# Patient Record
Sex: Female | Born: 1940 | Race: White | Hispanic: No | Marital: Married | Smoking: Former smoker
Health system: Southern US, Community
[De-identification: ages and names within clinical notes are randomized; demographics above are authoritative.]

## PROBLEM LIST (undated history)

## (undated) DIAGNOSIS — Z8619 Personal history of other infectious and parasitic diseases: Secondary | ICD-10-CM

## (undated) DIAGNOSIS — G894 Chronic pain syndrome: Secondary | ICD-10-CM

## (undated) DIAGNOSIS — M069 Rheumatoid arthritis, unspecified: Secondary | ICD-10-CM

## (undated) DIAGNOSIS — I10 Essential (primary) hypertension: Secondary | ICD-10-CM

## (undated) DIAGNOSIS — J90 Pleural effusion, not elsewhere classified: Secondary | ICD-10-CM

## (undated) DIAGNOSIS — I482 Chronic atrial fibrillation, unspecified: Secondary | ICD-10-CM

## (undated) DIAGNOSIS — J189 Pneumonia, unspecified organism: Secondary | ICD-10-CM

## (undated) DIAGNOSIS — D649 Anemia, unspecified: Secondary | ICD-10-CM

## (undated) HISTORY — DX: Personal history of other infectious and parasitic diseases: Z86.19

## (undated) HISTORY — DX: Essential (primary) hypertension: I10

---
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
## 1958-06-13 HISTORY — PX: APPENDECTOMY: SHX54

## 1987-06-14 HISTORY — PX: KNEE SURGERY: SHX244

## 1988-06-13 HISTORY — PX: CARPAL TUNNEL RELEASE: SHX101

## 2009-06-13 HISTORY — PX: OTHER SURGICAL HISTORY: SHX169

## 2010-06-13 HISTORY — PX: OTHER SURGICAL HISTORY: SHX169

## 2011-07-12 DIAGNOSIS — Z0000 Encounter for general adult medical examination without abnormal findings: Secondary | ICD-10-CM | POA: Diagnosis not present

## 2011-07-12 DIAGNOSIS — E039 Hypothyroidism, unspecified: Secondary | ICD-10-CM | POA: Diagnosis not present

## 2011-10-25 DIAGNOSIS — E785 Hyperlipidemia, unspecified: Secondary | ICD-10-CM | POA: Diagnosis not present

## 2011-11-11 DIAGNOSIS — H35379 Puckering of macula, unspecified eye: Secondary | ICD-10-CM | POA: Diagnosis not present

## 2011-11-11 DIAGNOSIS — H18419 Arcus senilis, unspecified eye: Secondary | ICD-10-CM | POA: Diagnosis not present

## 2011-11-11 DIAGNOSIS — H1045 Other chronic allergic conjunctivitis: Secondary | ICD-10-CM | POA: Diagnosis not present

## 2011-11-11 DIAGNOSIS — H2589 Other age-related cataract: Secondary | ICD-10-CM | POA: Diagnosis not present

## 2012-03-08 DIAGNOSIS — R05 Cough: Secondary | ICD-10-CM | POA: Diagnosis not present

## 2012-11-14 DIAGNOSIS — H40009 Preglaucoma, unspecified, unspecified eye: Secondary | ICD-10-CM | POA: Diagnosis not present

## 2012-11-16 DIAGNOSIS — R030 Elevated blood-pressure reading, without diagnosis of hypertension: Secondary | ICD-10-CM | POA: Diagnosis not present

## 2012-11-16 DIAGNOSIS — E7800 Pure hypercholesterolemia, unspecified: Secondary | ICD-10-CM | POA: Diagnosis not present

## 2012-11-16 DIAGNOSIS — E039 Hypothyroidism, unspecified: Secondary | ICD-10-CM | POA: Diagnosis not present

## 2012-12-17 DIAGNOSIS — E7800 Pure hypercholesterolemia, unspecified: Secondary | ICD-10-CM | POA: Diagnosis not present

## 2012-12-17 DIAGNOSIS — E039 Hypothyroidism, unspecified: Secondary | ICD-10-CM | POA: Diagnosis not present

## 2013-03-05 DIAGNOSIS — N390 Urinary tract infection, site not specified: Secondary | ICD-10-CM | POA: Diagnosis not present

## 2013-08-27 DIAGNOSIS — N390 Urinary tract infection, site not specified: Secondary | ICD-10-CM | POA: Diagnosis not present

## 2013-10-01 DIAGNOSIS — N3090 Cystitis, unspecified without hematuria: Secondary | ICD-10-CM | POA: Diagnosis not present

## 2013-10-01 DIAGNOSIS — R030 Elevated blood-pressure reading, without diagnosis of hypertension: Secondary | ICD-10-CM | POA: Diagnosis not present

## 2013-10-01 DIAGNOSIS — M2540 Effusion, unspecified joint: Secondary | ICD-10-CM | POA: Diagnosis not present

## 2013-10-01 LAB — CBC AND DIFFERENTIAL
HCT: 31 % — AB (ref 36–46)
Hemoglobin: 10.1 g/dL — AB (ref 12.0–16.0)
Platelets: 279 10*3/uL (ref 150–399)
WBC: 12.4 10^3/mL

## 2013-10-01 LAB — HEPATIC FUNCTION PANEL: AST: 16 U/L (ref 13–35)

## 2013-10-28 DIAGNOSIS — D649 Anemia, unspecified: Secondary | ICD-10-CM | POA: Diagnosis not present

## 2013-10-28 DIAGNOSIS — M064 Inflammatory polyarthropathy: Secondary | ICD-10-CM | POA: Diagnosis not present

## 2013-10-28 DIAGNOSIS — M25569 Pain in unspecified knee: Secondary | ICD-10-CM | POA: Diagnosis not present

## 2013-10-28 DIAGNOSIS — I10 Essential (primary) hypertension: Secondary | ICD-10-CM | POA: Diagnosis not present

## 2013-10-28 DIAGNOSIS — M79609 Pain in unspecified limb: Secondary | ICD-10-CM | POA: Diagnosis not present

## 2013-10-28 DIAGNOSIS — R609 Edema, unspecified: Secondary | ICD-10-CM | POA: Diagnosis not present

## 2013-11-11 DIAGNOSIS — E018 Other iodine-deficiency related thyroid disorders and allied conditions: Secondary | ICD-10-CM | POA: Diagnosis not present

## 2013-11-11 DIAGNOSIS — R0989 Other specified symptoms and signs involving the circulatory and respiratory systems: Secondary | ICD-10-CM | POA: Diagnosis not present

## 2013-11-11 DIAGNOSIS — I10 Essential (primary) hypertension: Secondary | ICD-10-CM | POA: Diagnosis not present

## 2013-11-12 DIAGNOSIS — H40009 Preglaucoma, unspecified, unspecified eye: Secondary | ICD-10-CM | POA: Diagnosis not present

## 2013-11-15 DIAGNOSIS — M069 Rheumatoid arthritis, unspecified: Secondary | ICD-10-CM | POA: Diagnosis not present

## 2013-11-15 DIAGNOSIS — R894 Abnormal immunological findings in specimens from other organs, systems and tissues: Secondary | ICD-10-CM | POA: Diagnosis not present

## 2013-11-15 DIAGNOSIS — M1990 Unspecified osteoarthritis, unspecified site: Secondary | ICD-10-CM | POA: Diagnosis not present

## 2013-11-18 ENCOUNTER — Ambulatory Visit: Payer: Self-pay | Admitting: Family Medicine

## 2013-11-18 DIAGNOSIS — I7090 Unspecified atherosclerosis: Secondary | ICD-10-CM | POA: Diagnosis not present

## 2013-11-18 DIAGNOSIS — R0989 Other specified symptoms and signs involving the circulatory and respiratory systems: Secondary | ICD-10-CM | POA: Diagnosis not present

## 2013-11-18 DIAGNOSIS — I658 Occlusion and stenosis of other precerebral arteries: Secondary | ICD-10-CM | POA: Diagnosis not present

## 2013-11-18 DIAGNOSIS — I6529 Occlusion and stenosis of unspecified carotid artery: Secondary | ICD-10-CM | POA: Diagnosis not present

## 2013-11-18 HISTORY — PX: OTHER SURGICAL HISTORY: SHX169

## 2013-12-04 DIAGNOSIS — E018 Other iodine-deficiency related thyroid disorders and allied conditions: Secondary | ICD-10-CM | POA: Diagnosis not present

## 2013-12-04 DIAGNOSIS — I10 Essential (primary) hypertension: Secondary | ICD-10-CM | POA: Diagnosis not present

## 2013-12-04 LAB — BASIC METABOLIC PANEL
BUN: 27 mg/dL — AB (ref 4–21)
CREATININE: 1.3 mg/dL — AB (ref 0.5–1.1)
Glucose: 92 mg/dL
POTASSIUM: 4.6 mmol/L (ref 3.4–5.3)
Sodium: 137 mmol/L (ref 137–147)

## 2013-12-05 DIAGNOSIS — E7800 Pure hypercholesterolemia, unspecified: Secondary | ICD-10-CM | POA: Diagnosis not present

## 2013-12-05 DIAGNOSIS — E018 Other iodine-deficiency related thyroid disorders and allied conditions: Secondary | ICD-10-CM | POA: Diagnosis not present

## 2013-12-05 DIAGNOSIS — I10 Essential (primary) hypertension: Secondary | ICD-10-CM | POA: Diagnosis not present

## 2014-01-28 DIAGNOSIS — M159 Polyosteoarthritis, unspecified: Secondary | ICD-10-CM | POA: Diagnosis not present

## 2014-01-28 DIAGNOSIS — D6859 Other primary thrombophilia: Secondary | ICD-10-CM | POA: Diagnosis not present

## 2014-01-28 DIAGNOSIS — M069 Rheumatoid arthritis, unspecified: Secondary | ICD-10-CM | POA: Diagnosis not present

## 2014-01-28 DIAGNOSIS — M79609 Pain in unspecified limb: Secondary | ICD-10-CM | POA: Diagnosis not present

## 2014-02-06 DIAGNOSIS — I10 Essential (primary) hypertension: Secondary | ICD-10-CM | POA: Diagnosis not present

## 2014-02-06 DIAGNOSIS — E7800 Pure hypercholesterolemia, unspecified: Secondary | ICD-10-CM | POA: Diagnosis not present

## 2014-02-06 DIAGNOSIS — E039 Hypothyroidism, unspecified: Secondary | ICD-10-CM | POA: Diagnosis not present

## 2014-02-07 DIAGNOSIS — E7800 Pure hypercholesterolemia, unspecified: Secondary | ICD-10-CM | POA: Diagnosis not present

## 2014-02-07 DIAGNOSIS — E018 Other iodine-deficiency related thyroid disorders and allied conditions: Secondary | ICD-10-CM | POA: Diagnosis not present

## 2014-02-07 LAB — TSH: TSH: 5.25 u[IU]/mL (ref 0.41–5.90)

## 2014-02-07 LAB — LIPID PANEL
Cholesterol: 192 mg/dL (ref 0–200)
HDL: 64 mg/dL (ref 35–70)
LDL Cholesterol: 103 mg/dL
TRIGLYCERIDES: 126 mg/dL (ref 40–160)

## 2014-02-07 LAB — HEPATIC FUNCTION PANEL: ALT: 16 U/L (ref 7–35)

## 2014-05-19 DIAGNOSIS — M0589 Other rheumatoid arthritis with rheumatoid factor of multiple sites: Secondary | ICD-10-CM | POA: Diagnosis not present

## 2014-05-19 DIAGNOSIS — M150 Primary generalized (osteo)arthritis: Secondary | ICD-10-CM | POA: Diagnosis not present

## 2014-05-19 DIAGNOSIS — R760 Raised antibody titer: Secondary | ICD-10-CM | POA: Diagnosis not present

## 2014-12-08 DIAGNOSIS — R760 Raised antibody titer: Secondary | ICD-10-CM | POA: Diagnosis not present

## 2014-12-08 DIAGNOSIS — M0579 Rheumatoid arthritis with rheumatoid factor of multiple sites without organ or systems involvement: Secondary | ICD-10-CM | POA: Diagnosis not present

## 2014-12-08 DIAGNOSIS — M7062 Trochanteric bursitis, left hip: Secondary | ICD-10-CM | POA: Diagnosis not present

## 2014-12-08 DIAGNOSIS — M150 Primary generalized (osteo)arthritis: Secondary | ICD-10-CM | POA: Diagnosis not present

## 2014-12-24 DIAGNOSIS — Z79899 Other long term (current) drug therapy: Secondary | ICD-10-CM | POA: Diagnosis not present

## 2014-12-24 DIAGNOSIS — M069 Rheumatoid arthritis, unspecified: Secondary | ICD-10-CM | POA: Diagnosis not present

## 2015-07-01 DIAGNOSIS — M0579 Rheumatoid arthritis with rheumatoid factor of multiple sites without organ or systems involvement: Secondary | ICD-10-CM | POA: Diagnosis not present

## 2015-07-01 DIAGNOSIS — M150 Primary generalized (osteo)arthritis: Secondary | ICD-10-CM | POA: Diagnosis not present

## 2015-07-01 DIAGNOSIS — Z79899 Other long term (current) drug therapy: Secondary | ICD-10-CM | POA: Diagnosis not present

## 2015-07-01 DIAGNOSIS — R760 Raised antibody titer: Secondary | ICD-10-CM | POA: Diagnosis not present

## 2015-07-07 DIAGNOSIS — D649 Anemia, unspecified: Secondary | ICD-10-CM | POA: Diagnosis not present

## 2015-07-15 DIAGNOSIS — R0989 Other specified symptoms and signs involving the circulatory and respiratory systems: Secondary | ICD-10-CM | POA: Insufficient documentation

## 2015-07-15 DIAGNOSIS — I10 Essential (primary) hypertension: Secondary | ICD-10-CM | POA: Insufficient documentation

## 2015-07-15 DIAGNOSIS — Z86718 Personal history of other venous thrombosis and embolism: Secondary | ICD-10-CM | POA: Insufficient documentation

## 2015-07-15 DIAGNOSIS — M2540 Effusion, unspecified joint: Secondary | ICD-10-CM | POA: Insufficient documentation

## 2015-07-15 DIAGNOSIS — Z8639 Personal history of other endocrine, nutritional and metabolic disease: Secondary | ICD-10-CM | POA: Insufficient documentation

## 2015-07-15 DIAGNOSIS — E7800 Pure hypercholesterolemia, unspecified: Secondary | ICD-10-CM | POA: Insufficient documentation

## 2015-07-15 DIAGNOSIS — E018 Other iodine-deficiency related thyroid disorders and allied conditions: Secondary | ICD-10-CM | POA: Insufficient documentation

## 2015-07-15 DIAGNOSIS — M1990 Unspecified osteoarthritis, unspecified site: Secondary | ICD-10-CM | POA: Insufficient documentation

## 2015-07-15 DIAGNOSIS — D6861 Antiphospholipid syndrome: Secondary | ICD-10-CM | POA: Insufficient documentation

## 2015-07-15 DIAGNOSIS — I440 Atrioventricular block, first degree: Secondary | ICD-10-CM | POA: Insufficient documentation

## 2015-07-16 ENCOUNTER — Encounter: Payer: Self-pay | Admitting: Family Medicine

## 2015-07-16 ENCOUNTER — Ambulatory Visit (INDEPENDENT_AMBULATORY_CARE_PROVIDER_SITE_OTHER): Payer: Medicare Other | Admitting: Family Medicine

## 2015-07-16 VITALS — BP 138/70 | HR 76 | Temp 98.2°F | Resp 18 | Ht 62.0 in | Wt 234.0 lb

## 2015-07-16 DIAGNOSIS — D509 Iron deficiency anemia, unspecified: Secondary | ICD-10-CM | POA: Diagnosis not present

## 2015-07-16 DIAGNOSIS — E7800 Pure hypercholesterolemia, unspecified: Secondary | ICD-10-CM | POA: Diagnosis not present

## 2015-07-16 DIAGNOSIS — M069 Rheumatoid arthritis, unspecified: Secondary | ICD-10-CM | POA: Insufficient documentation

## 2015-07-16 DIAGNOSIS — I10 Essential (primary) hypertension: Secondary | ICD-10-CM

## 2015-07-16 DIAGNOSIS — M1990 Unspecified osteoarthritis, unspecified site: Secondary | ICD-10-CM | POA: Insufficient documentation

## 2015-07-16 DIAGNOSIS — E018 Other iodine-deficiency related thyroid disorders and allied conditions: Secondary | ICD-10-CM

## 2015-07-16 MED ORDER — TRAMADOL HCL 50 MG PO TABS: 50.0000 mg | ORAL_TABLET | Freq: Three times a day (TID) | ORAL | Status: DC | PRN

## 2015-07-16 NOTE — Progress Notes (Signed)
Patient: Tonya Mcgee Female    DOB: Nov 20, 1940   75 y.o.   MRN: [id number] Visit Date: 07/16/2015  Today's Provider: Mila Merry, MD   Chief Complaint  Patient presents with  . Hypertension    follow up  . Hypothyroidism    follow up  . Hyperlipidemia    follow up   Subjective:    HPI   Hypertension, follow-up:  BP Readings from Last 3 Encounters:  02/06/14 120/80    She was last seen for hypertension 18  months ago.  BP at that visit was 120/80. Management since that visit includes no changes. She reports good compliance with treatment. She is not having side effects.  She is not exercising. She is adherent to low salt diet.   Outside blood pressures are 98-130 systolic over 60-80 diastolic. She is experiencing none.  Patient denies chest pain, chest pressure/discomfort, claudication, dyspnea, exertional chest pressure/discomfort, fatigue, irregular heart beat, orthopnea, palpitations, paroxysmal nocturnal dyspnea, syncope and tachypnea.   Cardiovascular risk factors include advanced age (older than 71 for men, 38 for women), dyslipidemia, hypertension and sedentary lifestyle.  Use of agents associated with hypertension: NSAIDS.     Weight trend: increasing steadily Wt Readings from Last 3 Encounters:  02/06/14 225 lb (102.059 kg)    Current diet: in general, a "healthy" diet    ------------------------------------------------------------------------   Lipid/Cholesterol, Follow-up:   Last seen for this18  months ago.  Management changes since that visit include none. . Last Lipid Panel:    Component Value Date/Time   CHOL 192 02/07/2014   TRIG 126 02/07/2014   HDL 64 02/07/2014   LDLCALC 103 02/07/2014    Risk factors for vascular disease include hypercholesterolemia and hypertension  She reports good compliance with treatment. Patient wasn't able to tolerate the Pravastatin (caused stomach pain), so while in [place] patient states she  was changed to Lipitor 10mg  daily. Patient reports good compliance with Lipitor and good tolerance.  She is not having side effects.  Current symptoms include none and have been stable. Weight trend: increasing steadily Prior visit with dietician: no Current diet: in general, a "healthy" diet   Current exercise: none  Wt Readings from Last 3 Encounters:  02/06/14 225 lb (102.059 kg)    -------------------------------------------------------------------  Follow up Hypothyroidism: Last office visit was 02/06/2014 and no changes were made. Since last visit patient patient was seen by her family Practice physician back home in 02/08/2014 who changes Levothyroxine to [place] 3 times a week and 4 times a week. Patient reports good tolerance with treatment.    Anemia She is followed by Dr. for rheumatoid arthritis and recent labs from 1/24 found Hgb=8.6 with borderline low iron, elevated TIBS elevated transferrin iron saturation of 5%. She was started on once daily iron supplement. She states they discussed referral for colonoscopy, but was going to see how she responded to oral iron replacement first.     Allergies  Allergen Reactions  . Amoxicillin Hives  . Ciprofloxacin Hives  . Codeine Hives  . Iodine Hives    Throat swelling  . Keflex  [Cephalexin]   . Tape     skin red irritated   Previous Medications   ASPIRIN 81 MG TABLET    Take 1 tablet by mouth daily.   ATORVASTATIN (LIPITOR) 10 MG TABLET    Take 10 mg by mouth daily.   FERROUS SULFATE (IRON) 325 (65 FE) MG TABS    Take 1  tablet by mouth daily.   HYDROXYCHLOROQUINE (PLAQUENIL) 200 MG TABLET    Take 2 tablets by mouth daily.   LEVOTHYROXINE (LEVOXYL) 88 MCG TABLET    Take 1 tablet by mouth. Four times a week   LEVOTHYROXINE (SYNTHROID, LEVOTHROID) 100 MCG TABLET    Take 100 mcg by mouth. 3 times a week   LISINOPRIL-HYDROCHLOROTHIAZIDE (PRINZIDE,ZESTORETIC) 10-12.5 MG TABLET    Take 1 tablet by mouth daily.    MULTIPLE VITAMINS-MINERALS (MULTIVITAMIN ADULT PO)    Take 1 tablet by mouth daily.   OMEGA 3 1200 MG CAPS    Take 1 capsule by mouth daily.    Review of Systems  Constitutional: Negative for fever, chills, appetite change and fatigue.  Respiratory: Negative for chest tightness and shortness of breath.   Cardiovascular: Negative for chest pain and palpitations.  Gastrointestinal: Negative for nausea, vomiting and abdominal pain.  Musculoskeletal: Positive for arthralgias.  Neurological: Negative for dizziness and weakness.    Social History  Substance Use Topics  . Smoking status: Former Smoker -- 0.50 packs/day for 15 years    Types: Cigarettes    Quit date: 06/13/1978  . Smokeless tobacco: Not on file  . Alcohol Use: 0.0 oz/week    0 Standard drinks or equivalent per week     Comment: occasional use; 1-2 times a year   Objective:   BP 138/70 mmHg  Pulse 76  Temp(Src) 98.2 F (36.8 C) (Oral)  Resp 18  Ht 5\' 2"  (1.575 m)  Wt 234 lb (106.142 kg)  BMI 42.79 kg/m2  SpO2 100%  Physical Exam   General Appearance:    Alert, cooperative, no distress  Eyes:    PERRL, conjunctiva/corneas clear, EOM's intact       Lungs:     Clear to auscultation bilaterally, respirations unlabored  Heart:    Regular rate and rhythm, II/VI systolic murmur  Neurologic:   Awake, alert, oriented x 3. No apparent focal neurological           defect.          Assessment & Plan:     1. Osteoarthritis, unspecified osteoarthritis type, unspecified site Requests refill for tramadol which she feels is helpful - traMADol (ULTRAM) 50 MG tablet; Take 1 tablet (50 mg total) by mouth every 8 (eight) hours as needed.  Dispense: 30 tablet; Refill: 0  2. Hypothyroidism, iodine Due to check thyroid functions - T4 AND TSH  3. Essential hypertension Well controlled.  Continue current medications.   - Renal function panel  4. Hypercholesterolemia She is tolerating atorvastatin well with no adverse  effects.   - Lipid panel  5. Anemia Likely iron deficiency per labs done from Dr. office Will recheck labs after being on iron replacement for a month.       Gavin Potters, MD  [facility]

## 2015-07-17 ENCOUNTER — Other Ambulatory Visit: Payer: Self-pay | Admitting: Family Medicine

## 2015-07-17 NOTE — Telephone Encounter (Signed)
Pt stated that she was in for OV on 07/16/15 and were supposed to send the following RX sent to [facility]:  1. atorvastatin (LIPITOR) 10 MG tablet 2. levothyroxine (LEVOXYL) 88 MCG tablet  3. levothyroxine (SYNTHROID, LEVOTHROID) 100 MCG tablet 4. lisinopril-hydrochlorothiazide (PRINZIDE,ZESTORETIC) 10-12.5 MG tablet  Thanks TNP

## 2015-07-20 NOTE — Telephone Encounter (Signed)
We needed to see results of labs before refilling medications. Has she had blood drawn yet?

## 2015-07-21 DIAGNOSIS — E018 Other iodine-deficiency related thyroid disorders and allied conditions: Secondary | ICD-10-CM | POA: Diagnosis not present

## 2015-07-21 DIAGNOSIS — I10 Essential (primary) hypertension: Secondary | ICD-10-CM | POA: Diagnosis not present

## 2015-07-21 DIAGNOSIS — E7800 Pure hypercholesterolemia, unspecified: Secondary | ICD-10-CM | POA: Diagnosis not present

## 2015-07-22 ENCOUNTER — Other Ambulatory Visit: Payer: Self-pay | Admitting: Family Medicine

## 2015-07-22 ENCOUNTER — Telehealth: Payer: Self-pay

## 2015-07-22 LAB — LIPID PANEL
Chol/HDL Ratio: 2.4 ratio units (ref 0.0–4.4)
Cholesterol, Total: 176 mg/dL (ref 100–199)
HDL: 72 mg/dL (ref >39)
LDL CALC: 83 mg/dL (ref 0–99)
Triglycerides: 107 mg/dL (ref 0–149)
VLDL CHOLESTEROL CAL: 21 mg/dL (ref 5–40)

## 2015-07-22 LAB — RENAL FUNCTION PANEL
ALBUMIN: 3.9 g/dL (ref 3.5–4.8)
BUN / CREAT RATIO: 18 (ref 11–26)
BUN: 24 mg/dL (ref 8–27)
CALCIUM: 8.6 mg/dL — AB (ref 8.7–10.3)
CHLORIDE: 97 mmol/L (ref 96–106)
CO2: 22 mmol/L (ref 18–29)
CREATININE: 1.31 mg/dL — AB (ref 0.57–1.00)
GFR calc Af Amer: 46 mL/min/{1.73_m2} — ABNORMAL LOW (ref >59)
GFR calc non Af Amer: 40 mL/min/{1.73_m2} — ABNORMAL LOW (ref >59)
Glucose: 89 mg/dL (ref 65–99)
Phosphorus: 4.5 mg/dL (ref 2.5–4.5)
Potassium: 4.9 mmol/L (ref 3.5–5.2)
Sodium: 137 mmol/L (ref 134–144)

## 2015-07-22 LAB — T4 AND TSH
T4, Total: 8.6 ug/dL (ref 4.5–12.0)
TSH: 7.81 u[IU]/mL — AB (ref 0.450–4.500)

## 2015-07-22 MED ORDER — LEVOTHYROXINE SODIUM 100 MCG PO TABS: 100.0000 ug | ORAL_TABLET | Freq: Every day | ORAL | Status: DC

## 2015-07-22 MED ORDER — ATORVASTATIN CALCIUM 10 MG PO TABS: 10.0000 mg | ORAL_TABLET | Freq: Every day | ORAL | Status: DC

## 2015-07-22 MED ORDER — LISINOPRIL-HYDROCHLOROTHIAZIDE 10-12.5 MG PO TABS: 1.0000 | ORAL_TABLET | Freq: Every day | ORAL | Status: DC

## 2015-07-22 NOTE — Telephone Encounter (Signed)
Patient advised as directed below. Patient verbalized understanding. RX sent to [facility]. Patient will call back to schedule a follow up appointment.   Patient is requesting a refill on Lipitor and Lisinopril-HCTZ be sent to [facility]. Patient states this is her 3 refill request and she is almost out of medication.

## 2015-07-22 NOTE — Telephone Encounter (Signed)
-----   Message from Malva Limes, MD sent at 07/22/2015  9:57 AM EST ----- Labs show she is hyPOthyroid. Need to increase levothyroxine to daily, #90, rf x 1. Other labs fine. Need to follow up in 3 months.

## 2015-07-23 ENCOUNTER — Telehealth: Payer: Self-pay

## 2015-07-23 NOTE — Telephone Encounter (Signed)
Patient called and states that the 3 medications we filled yesterday [facility] were filled but pharmacy needed clarification on thyroid medication. I could not find that fax maybe dr. Sherrie Mustache took it home with him. Please review and let pt know when you find out thank you-aa

## 2015-07-24 MED ORDER — LEVOTHYROXINE SODIUM 100 MCG PO TABS: 100.0000 ug | ORAL_TABLET | Freq: Every day | ORAL | Status: DC

## 2015-07-24 NOTE — Telephone Encounter (Signed)
Rx for levothyroxine that was sent in still had the old directions (x3 a week) in the script. Clarification given. [facility] has already shipped levothyroxine with correct quantity. Patient was notified.

## 2015-07-28 ENCOUNTER — Encounter: Payer: Self-pay | Admitting: Family Medicine

## 2015-08-05 ENCOUNTER — Telehealth: Payer: Self-pay | Admitting: Family Medicine

## 2015-08-05 DIAGNOSIS — D509 Iron deficiency anemia, unspecified: Secondary | ICD-10-CM

## 2015-08-05 NOTE — Telephone Encounter (Signed)
LMTCB

## 2015-08-05 NOTE — Telephone Encounter (Signed)
Please advise patient it is time to check iron levels,  Order has been printed and is ready for her to pick up.

## 2015-08-06 NOTE — Telephone Encounter (Signed)
Gave pt message.  She will pick up lab slip.  Thanks Barth Kirks

## 2015-08-07 DIAGNOSIS — D509 Iron deficiency anemia, unspecified: Secondary | ICD-10-CM | POA: Diagnosis not present

## 2015-08-08 LAB — CBC
HEMATOCRIT: 31.3 % — AB (ref 34.0–46.6)
HEMOGLOBIN: 10.1 g/dL — AB (ref 11.1–15.9)
MCH: 28.1 pg (ref 26.6–33.0)
MCHC: 32.3 g/dL (ref 31.5–35.7)
MCV: 87 fL (ref 79–97)
Platelets: 297 10*3/uL (ref 150–379)
RBC: 3.59 x10E6/uL — ABNORMAL LOW (ref 3.77–5.28)
RDW: 21.2 % — AB (ref 12.3–15.4)
WBC: 6.2 10*3/uL (ref 3.4–10.8)

## 2015-08-08 LAB — FERRITIN: Ferritin: 34 ng/mL (ref 15–150)

## 2015-08-11 ENCOUNTER — Encounter: Payer: Self-pay | Admitting: Family Medicine

## 2015-08-11 ENCOUNTER — Ambulatory Visit (INDEPENDENT_AMBULATORY_CARE_PROVIDER_SITE_OTHER): Payer: Medicare Other | Admitting: Family Medicine

## 2015-08-11 VITALS — BP 140/82 | HR 79 | Temp 98.7°F | Resp 16 | Ht 62.0 in | Wt 234.0 lb

## 2015-08-11 DIAGNOSIS — M2550 Pain in unspecified joint: Secondary | ICD-10-CM

## 2015-08-11 DIAGNOSIS — D509 Iron deficiency anemia, unspecified: Secondary | ICD-10-CM | POA: Diagnosis not present

## 2015-08-11 MED ORDER — PREDNISONE 20 MG PO TABS: 20.0000 mg | ORAL_TABLET | Freq: Every day | ORAL | Status: AC

## 2015-08-11 NOTE — Progress Notes (Signed)
Patient: Tonya Mcgee Female    DOB: 01-07-1941   75 y.o.   MRN: [id number] Visit Date: 08/11/2015  Today's Provider: Mila Merry, MD   Chief Complaint  Patient presents with  . Joint Pain   Subjective:    HPI  Patient has recurrent joint pain. Patient says she has flare-ups of pain. Has been much worse in shoulders and fingers for the last 4-5 days. She states has had several similar flares in the past and always respond well to prednisone. She is not due to see Dr. Gavin Potters again until the summer.  She is also here to follow up on anemia. Hbg was 8.6 when she saw Dr. Gavin Potters in January. Has since been taking OTC iron supplement and was up to 10.1 last week. She reports no change in bowels or stools. She states she had a normal colonoscopy in 2011.   Lab Results  Component Value Date   FERRITIN 34 08/07/2015    Lab Results  Component Value Date   HGB 10.1* 10/01/2013       Allergies  Allergen Reactions  . Amoxicillin Hives  . Ciprofloxacin Hives  . Codeine Other (See Comments)    Weird dreams  . Iodine Hives    Throat swelling  . Keflex  [Cephalexin]   . Pravastatin Nausea Only  . Tape     skin red irritated   Previous Medications   ASPIRIN 81 MG TABLET    Take 1 tablet by mouth daily.   ATORVASTATIN (LIPITOR) 10 MG TABLET    Take 1 tablet (10 mg total) by mouth daily.   FERROUS SULFATE (IRON) 325 (65 FE) MG TABS    Take 1 tablet by mouth daily.   HYDROXYCHLOROQUINE (PLAQUENIL) 200 MG TABLET    Take 2 tablets by mouth daily.   LEVOTHYROXINE (SYNTHROID, LEVOTHROID) 100 MCG TABLET    Take 1 tablet (100 mcg total) by mouth daily before breakfast.   LISINOPRIL-HYDROCHLOROTHIAZIDE (PRINZIDE,ZESTORETIC) 10-12.5 MG TABLET    Take 1 tablet by mouth daily.   MULTIPLE VITAMINS-MINERALS (MULTIVITAMIN ADULT PO)    Take 1 tablet by mouth daily.   OMEGA 3 1200 MG CAPS    Take 1 capsule by mouth daily.   TRAMADOL (ULTRAM) 50 MG TABLET    Take 1 tablet (50 mg total)  by mouth every 8 (eight) hours as needed.    Review of Systems  Constitutional: Negative for fever, chills, appetite change and fatigue.  Respiratory: Negative for chest tightness and shortness of breath.   Cardiovascular: Negative for chest pain and palpitations.  Gastrointestinal: Negative for nausea, vomiting and abdominal pain.  Musculoskeletal: Positive for back pain, joint swelling and arthralgias.  Neurological: Negative for dizziness and weakness.    Social History  Substance Use Topics  . Smoking status: Former Smoker -- 0.50 packs/day for 15 years    Types: Cigarettes    Quit date: 06/13/1978  . Smokeless tobacco: Not on file  . Alcohol Use: 0.0 oz/week    0 Standard drinks or equivalent per week     Comment: occasional use; 1-2 times a year   Objective:   BP 140/82 mmHg  Pulse 79  Temp(Src) 98.7 F (37.1 C) (Oral)  Resp 16  Ht 5\' 2"  (1.575 m)  Wt 234 lb (106.142 kg)  BMI 42.79 kg/m2  SpO2 95%  Physical Exam   General Appearance:    Alert, cooperative, no distress  Eyes:    PERRL, conjunctiva/corneas clear, EOM's intact  Lungs:     Clear to auscultation bilaterally, respirations unlabored  Heart:    Regular rate and rhythm  Neurologic:   Awake, alert, oriented x 3. No apparent focal neurological           defect.           Assessment & Plan:     1. Iron deficiency anemia She is reluctant to have another colonoscopy and has had no change in BMs or stools. Has had significant increase in hemoglobin with iron supplementation. She is agreeable to checking for occult blood in stool.  - IFOBT POC (occult bld, rslt in office)  2. Arthralgia She states short course of prednisone has always worked well for her in the past. She is to call if not quickly improving.  - predniSONE (DELTASONE) 20 MG tablet; Take 1 tablet (20 mg total) by mouth daily with breakfast.  Dispense: 14 tablet; Refill: 0       Mila Merry, MD  [facility]

## 2015-10-06 ENCOUNTER — Ambulatory Visit (INDEPENDENT_AMBULATORY_CARE_PROVIDER_SITE_OTHER): Payer: Medicare Other | Admitting: Family Medicine

## 2015-10-06 ENCOUNTER — Encounter: Payer: Self-pay | Admitting: Family Medicine

## 2015-10-06 VITALS — BP 150/70 | HR 76 | Temp 98.1°F | Resp 16 | Ht 62.0 in | Wt 210.0 lb

## 2015-10-06 DIAGNOSIS — M5442 Lumbago with sciatica, left side: Secondary | ICD-10-CM | POA: Diagnosis not present

## 2015-10-06 LAB — IFOBT (OCCULT BLOOD): IFOBT: POSITIVE

## 2015-10-06 MED ORDER — NAPROXEN 500 MG PO TABS: 500.0000 mg | ORAL_TABLET | Freq: Two times a day (BID) | ORAL | Status: DC | PRN

## 2015-10-06 MED ORDER — CYCLOBENZAPRINE HCL 10 MG PO TABS: 10.0000 mg | ORAL_TABLET | Freq: Every evening | ORAL | Status: DC | PRN

## 2015-10-06 NOTE — Progress Notes (Signed)
Patient: Tonya Mcgee Female    DOB: August 03, 1940   75 y.o.   MRN: [id number] Visit Date: 10/06/2015  Today's Provider: Mila Merry, MD   Chief Complaint  Patient presents with  . Back Pain   Subjective:    Back Pain This is a new problem. The current episode started more than 1 month ago (1 month). The problem occurs intermittently. The problem is unchanged. The pain is present in the lumbar spine. The quality of the pain is described as cramping and aching. The pain radiates to the left thigh. The pain is at a severity of 8/10. The pain is moderate. The pain is worse during the day. The symptoms are aggravated by lying down, position, bending and coughing. Associated symptoms include leg pain, numbness and tingling. Pertinent negatives include no abdominal pain, bladder incontinence, bowel incontinence, chest pain, dysuria, fever, headaches, paresis, paresthesias, pelvic pain, perianal numbness, weakness or weight loss. (Both legs) She has tried NSAIDs (tylenol and ibuprofen) for the symptoms. The treatment provided mild relief.    Back pain started 1 month ago when she was on a cruise. She slept on a soft mattress and has had low back pain since. Pain resolved within a week of returning home, but came back about 2 weeks ago when she had cough and cold symptoms.  Pain is worse in the morning and eases off into the evening. Does have radiation in left hip. Has taken Advil and Tylenol which don't help much. Pain is worse when she gets up in the morning.     Allergies  Allergen Reactions  . Amoxicillin Hives  . Ciprofloxacin Hives  . Codeine Other (See Comments)    Weird dreams  . Iodine Hives    Throat swelling  . Keflex  [Cephalexin]   . Pravastatin Nausea Only  . Tape     skin red irritated   Previous Medications   ASPIRIN 81 MG TABLET    Take 1 tablet by mouth daily.   ATORVASTATIN (LIPITOR) 10 MG TABLET    Take 1 tablet (10 mg total) by mouth daily.   FERROUS SULFATE  (IRON) 325 (65 FE) MG TABS    Take 1 tablet by mouth daily.   HYDROXYCHLOROQUINE (PLAQUENIL) 200 MG TABLET    Take 2 tablets by mouth daily.   LEVOTHYROXINE (SYNTHROID, LEVOTHROID) 100 MCG TABLET    Take 1 tablet (100 mcg total) by mouth daily before breakfast.   LISINOPRIL-HYDROCHLOROTHIAZIDE (PRINZIDE,ZESTORETIC) 10-12.5 MG TABLET    Take 1 tablet by mouth daily.   MULTIPLE VITAMINS-MINERALS (MULTIVITAMIN ADULT PO)    Take 1 tablet by mouth daily.   OMEGA 3 1200 MG CAPS    Take 1 capsule by mouth daily.   TRAMADOL (ULTRAM) 50 MG TABLET    Take 1 tablet (50 mg total) by mouth every 8 (eight) hours as needed.    Review of Systems  Constitutional: Negative for fever, chills, weight loss, appetite change and fatigue.  Respiratory: Negative for chest tightness and shortness of breath.   Cardiovascular: Negative for chest pain and palpitations.  Gastrointestinal: Negative for nausea, vomiting, abdominal pain and bowel incontinence.  Genitourinary: Negative for bladder incontinence, dysuria and pelvic pain.  Musculoskeletal: Positive for myalgias and back pain.  Neurological: Positive for tingling and numbness. Negative for dizziness, weakness, headaches and paresthesias.    Social History  Substance Use Topics  . Smoking status: Former Smoker -- 0.50 packs/day for 15 years    Types: Cigarettes  Quit date: 06/13/1978  . Smokeless tobacco: Not on file  . Alcohol Use: 0.0 oz/week    0 Standard drinks or equivalent per week     Comment: occasional use; 1-2 times a year   Objective:   BP 150/70 mmHg  Pulse 76  Temp(Src) 98.1 F (36.7 C) (Oral)  Resp 16  Ht 5\' 2"  (1.575 m)  Wt 210 lb (95.255 kg)  BMI 38.40 kg/m2  SpO2 97%  Physical Exam  General appearance: alert, well developed, well nourished, cooperative and in no distress Head: Normocephalic, without obvious abnormality, atraumatic Lungs: Respirations even and unlabored MS: Mild tenderness across para-lumbar muscles  bilaterally. . No rashes seen  Psych: Appropriate mood and affect. Neurologic: Mental status: Alert, oriented to person, place, and time, thought content appropriate.     Assessment & Plan:     1. Low back pain with left-sided sciatica, unspecified back pain laterality  - naproxen (NAPROSYN) 500 MG tablet; Take 1 tablet (500 mg total) by mouth 2 (two) times daily as needed.  Dispense: 30 tablet; Refill: 0 - cyclobenzaprine (FLEXERIL) 10 MG tablet; Take 1 tablet (10 mg total) by mouth at bedtime as needed (back pain).  Dispense: 20 tablet; Refill: 0   Call if symptoms change or if not rapidly improving.          , MD  [facility]

## 2015-10-12 ENCOUNTER — Ambulatory Visit
Admission: RE | Admit: 2015-10-12 | Discharge: 2015-10-12 | Disposition: A | Discharge status: Home / Self Care | Payer: Medicare Other | Source: Ambulatory Visit | Attending: Family Medicine | Admitting: Family Medicine

## 2015-10-12 ENCOUNTER — Telehealth: Payer: Self-pay | Admitting: Family Medicine

## 2015-10-12 DIAGNOSIS — M5441 Lumbago with sciatica, right side: Secondary | ICD-10-CM

## 2015-10-12 DIAGNOSIS — I7090 Unspecified atherosclerosis: Secondary | ICD-10-CM | POA: Diagnosis not present

## 2015-10-12 DIAGNOSIS — M47816 Spondylosis without myelopathy or radiculopathy, lumbar region: Secondary | ICD-10-CM | POA: Diagnosis not present

## 2015-10-12 DIAGNOSIS — M4186 Other forms of scoliosis, lumbar region: Secondary | ICD-10-CM | POA: Insufficient documentation

## 2015-10-12 DIAGNOSIS — M5442 Lumbago with sciatica, left side: Principal | ICD-10-CM

## 2015-10-12 NOTE — Telephone Encounter (Signed)
Patient was notified. Patient stated that she will get x-ray done.

## 2015-10-12 NOTE — Telephone Encounter (Signed)
Have ordered LS spine. Please have her go to [facility] today for Xr.

## 2015-10-12 NOTE — Telephone Encounter (Signed)
Please advise 

## 2015-10-12 NOTE — Telephone Encounter (Signed)
Pt stated she has been taking cyclobenzaprine (FLEXERIL) 10 MG tablet & naproxen (NAPROSYN) 500 MG tablet as directed since 10/06/15 and her pain has gotten worse not better. Pt wanted to see what else she could try or if she should get an X ray. Pharmacy: [facility]. [address]. Please advise. Thanks TNP

## 2015-10-14 ENCOUNTER — Encounter: Payer: Self-pay | Admitting: Family Medicine

## 2015-10-14 ENCOUNTER — Telehealth: Payer: Self-pay | Admitting: Family Medicine

## 2015-10-14 DIAGNOSIS — M5442 Lumbago with sciatica, left side: Secondary | ICD-10-CM

## 2015-10-14 DIAGNOSIS — I700 Atherosclerosis of aorta: Secondary | ICD-10-CM | POA: Insufficient documentation

## 2015-10-14 NOTE — Telephone Encounter (Signed)
-----   Message from Malva Limes, MD sent at 10/14/2015 11:13 AM EDT ----- Herby Abraham shows a lot of arthritis in spine, otherwise normal. Recommend referral to physical therapy if back pain is not improving.

## 2015-10-14 NOTE — Telephone Encounter (Signed)
Pt is requesting the results from a x-ray.  CB#[phone]/[id number]

## 2015-10-14 NOTE — Telephone Encounter (Signed)
Patient was notified of results. Patient expressed understanding. Order for referral is in epic.

## 2015-10-14 NOTE — Telephone Encounter (Signed)
Please schedule referral for PT? Thanks!

## 2015-10-15 ENCOUNTER — Encounter: Payer: Self-pay | Admitting: Family Medicine

## 2015-10-20 DIAGNOSIS — R262 Difficulty in walking, not elsewhere classified: Secondary | ICD-10-CM | POA: Diagnosis not present

## 2015-10-20 DIAGNOSIS — M6281 Muscle weakness (generalized): Secondary | ICD-10-CM | POA: Diagnosis not present

## 2015-10-20 DIAGNOSIS — M545 Low back pain: Secondary | ICD-10-CM | POA: Diagnosis not present

## 2015-10-22 DIAGNOSIS — M6281 Muscle weakness (generalized): Secondary | ICD-10-CM | POA: Diagnosis not present

## 2015-10-22 DIAGNOSIS — R262 Difficulty in walking, not elsewhere classified: Secondary | ICD-10-CM | POA: Diagnosis not present

## 2015-10-22 DIAGNOSIS — M545 Low back pain: Secondary | ICD-10-CM | POA: Diagnosis not present

## 2015-10-26 ENCOUNTER — Telehealth: Payer: Self-pay

## 2015-10-26 DIAGNOSIS — M545 Low back pain: Secondary | ICD-10-CM

## 2015-10-26 DIAGNOSIS — M6281 Muscle weakness (generalized): Secondary | ICD-10-CM | POA: Diagnosis not present

## 2015-10-26 DIAGNOSIS — R262 Difficulty in walking, not elsewhere classified: Secondary | ICD-10-CM | POA: Diagnosis not present

## 2015-10-26 NOTE — Telephone Encounter (Signed)
Daniel from [facility] called stating patient was seen today for a Physcical therapy session. The therapist want to know if Dr. Sherrie Mustache could add a referral for left knee because patient is having a lot of pain in her left knee, which they feel may be the cause of her back pain. Please fax order to ( [phone]. Questions call Reuel Boom at [phone]

## 2015-10-26 NOTE — Telephone Encounter (Signed)
Please clarify. Is he requesting orthopedic referral for knee pain? If so then that is fine and forward to Sarah for referral to orthopedist.

## 2015-10-26 NOTE — Telephone Encounter (Signed)
Pt called back saying the order PT needs to be for back pain and left leg strengthening  and exercises because of left knee weakness.  She stated she did not need an order for an orhtopedic referral.  Pt call back [phone]  Thanks teri

## 2015-10-27 NOTE — Telephone Encounter (Signed)
OK for PT for back pain and left leg strengthening.

## 2015-10-27 NOTE — Telephone Encounter (Signed)
New order in epic. 

## 2015-10-28 NOTE — Telephone Encounter (Signed)
Order faxed to [facility]

## 2015-10-29 ENCOUNTER — Ambulatory Visit: Payer: Medicare Other | Admitting: Physical Therapy

## 2015-10-29 DIAGNOSIS — M545 Low back pain: Secondary | ICD-10-CM | POA: Diagnosis not present

## 2015-10-29 DIAGNOSIS — M6281 Muscle weakness (generalized): Secondary | ICD-10-CM | POA: Diagnosis not present

## 2015-10-29 DIAGNOSIS — R262 Difficulty in walking, not elsewhere classified: Secondary | ICD-10-CM | POA: Diagnosis not present

## 2015-10-30 ENCOUNTER — Other Ambulatory Visit: Payer: Self-pay | Admitting: Family Medicine

## 2015-10-30 DIAGNOSIS — M5442 Lumbago with sciatica, left side: Secondary | ICD-10-CM

## 2015-10-30 MED ORDER — CYCLOBENZAPRINE HCL 10 MG PO TABS: 10.0000 mg | ORAL_TABLET | Freq: Every evening | ORAL | Status: DC | PRN

## 2015-10-30 NOTE — Addendum Note (Signed)
Addended by: Malva Limes on: 10/30/2015 12:39 PM   Modules accepted: Orders

## 2015-11-02 DIAGNOSIS — M6281 Muscle weakness (generalized): Secondary | ICD-10-CM | POA: Diagnosis not present

## 2015-11-02 DIAGNOSIS — M545 Low back pain: Secondary | ICD-10-CM | POA: Diagnosis not present

## 2015-11-02 DIAGNOSIS — R262 Difficulty in walking, not elsewhere classified: Secondary | ICD-10-CM | POA: Diagnosis not present

## 2015-11-03 ENCOUNTER — Encounter: Payer: Medicare Other | Admitting: Physical Therapy

## 2015-11-05 ENCOUNTER — Encounter: Payer: Medicare Other | Admitting: Physical Therapy

## 2015-11-10 DIAGNOSIS — M545 Low back pain: Secondary | ICD-10-CM | POA: Diagnosis not present

## 2015-11-10 DIAGNOSIS — R262 Difficulty in walking, not elsewhere classified: Secondary | ICD-10-CM | POA: Diagnosis not present

## 2015-11-10 DIAGNOSIS — M6281 Muscle weakness (generalized): Secondary | ICD-10-CM | POA: Diagnosis not present

## 2015-11-12 ENCOUNTER — Encounter: Payer: Medicare Other | Admitting: Physical Therapy

## 2015-11-16 ENCOUNTER — Encounter: Payer: Medicare Other | Admitting: Physical Therapy

## 2015-11-18 DIAGNOSIS — M545 Low back pain: Secondary | ICD-10-CM | POA: Diagnosis not present

## 2015-11-18 DIAGNOSIS — M6281 Muscle weakness (generalized): Secondary | ICD-10-CM | POA: Diagnosis not present

## 2015-11-18 DIAGNOSIS — R262 Difficulty in walking, not elsewhere classified: Secondary | ICD-10-CM | POA: Diagnosis not present

## 2015-11-19 ENCOUNTER — Encounter: Payer: Medicare Other | Admitting: Physical Therapy

## 2015-11-22 ENCOUNTER — Other Ambulatory Visit: Payer: Self-pay | Admitting: Family Medicine

## 2015-11-23 ENCOUNTER — Encounter: Payer: Medicare Other | Admitting: Physical Therapy

## 2015-11-26 ENCOUNTER — Encounter: Payer: Medicare Other | Admitting: Physical Therapy

## 2015-11-26 DIAGNOSIS — R262 Difficulty in walking, not elsewhere classified: Secondary | ICD-10-CM | POA: Diagnosis not present

## 2015-11-26 DIAGNOSIS — M6281 Muscle weakness (generalized): Secondary | ICD-10-CM | POA: Diagnosis not present

## 2015-11-26 DIAGNOSIS — M545 Low back pain: Secondary | ICD-10-CM | POA: Diagnosis not present

## 2015-11-30 ENCOUNTER — Encounter: Payer: Medicare Other | Admitting: Physical Therapy

## 2015-12-01 ENCOUNTER — Other Ambulatory Visit: Payer: Self-pay | Admitting: Family Medicine

## 2015-12-01 DIAGNOSIS — M1990 Unspecified osteoarthritis, unspecified site: Secondary | ICD-10-CM

## 2015-12-02 MED ORDER — TRAMADOL HCL 50 MG PO TABS: 50.0000 mg | ORAL_TABLET | Freq: Three times a day (TID) | ORAL | Status: DC | PRN

## 2015-12-02 NOTE — Telephone Encounter (Signed)
Please call in tramadol.  

## 2015-12-02 NOTE — Telephone Encounter (Signed)
Rx called in to pharmacy. 

## 2015-12-03 ENCOUNTER — Encounter: Payer: Medicare Other | Admitting: Physical Therapy

## 2015-12-17 ENCOUNTER — Ambulatory Visit: Payer: Self-pay | Admitting: Family Medicine

## 2015-12-22 ENCOUNTER — Ambulatory Visit (INDEPENDENT_AMBULATORY_CARE_PROVIDER_SITE_OTHER): Payer: Medicare Other | Admitting: Family Medicine

## 2015-12-22 ENCOUNTER — Encounter: Payer: Self-pay | Admitting: Family Medicine

## 2015-12-22 VITALS — BP 130/80 | HR 74 | Temp 98.0°F | Resp 16 | Wt 234.0 lb

## 2015-12-22 DIAGNOSIS — M5432 Sciatica, left side: Secondary | ICD-10-CM

## 2015-12-22 MED ORDER — PREDNISONE 10 MG PO TABS: ORAL_TABLET | ORAL | Status: AC

## 2015-12-22 NOTE — Progress Notes (Signed)
Patient: Tonya Mcgee Female    DOB: 08/24/1940   75 y.o.   MRN: [id number] Visit Date: 12/22/2015  Today's Provider: Mila Merry, MD   Chief Complaint  Patient presents with  . Back Pain   Subjective:    HPI Low Back pain with Left sided sciatica: Patient was last seen for this problem 3 months ago. Management during that visit includes starting Naproxen and Cyclobenzaprine. X rays was also ordered showing arthritis in the spine. Patient was referred to Physical Therapy at [facility] which she has completed. Today patient comes in stating he has been to Physical therapy and her back pain has improved. Patient has been taking the Flexeril, but reports the Naproxen did not help. Patient says her back pain has improved since the last ofice visit. She now has pain on her lower left side of her back for the last 2 weeks. Pain radiates down her left leg, but back pain is much better. She continues to do back exercise on a regular basis. She continues routine follow up with Dr. Gavin Potters for rheumatoid arthritis and has follow up 7-31    Allergies  Allergen Reactions  . Amoxicillin Hives  . Ciprofloxacin Hives  . Codeine Other (See Comments)    Weird dreams  . Iodine Hives    Throat swelling  . Keflex  [Cephalexin]   . Pravastatin Nausea Only  . Tape     skin red irritated   Current Meds  Medication Sig  . aspirin 81 MG tablet Take 1 tablet by mouth daily.  Marland Kitchen atorvastatin (LIPITOR) 10 MG tablet Take 1 tablet (10 mg total) by mouth daily.  . cyclobenzaprine (FLEXERIL) 10 MG tablet Take 1 tablet (10 mg total) by mouth at bedtime as needed (back pain).  . Ferrous Sulfate (IRON) 325 (65 Fe) MG TABS Take 1 tablet by mouth daily.  . hydroxychloroquine (PLAQUENIL) 200 MG tablet Take 2 tablets by mouth daily.  Marland Kitchen levothyroxine (SYNTHROID, LEVOTHROID) 100 MCG tablet TAKE ONE TABLET DAILY  . lisinopril-hydrochlorothiazide (PRINZIDE,ZESTORETIC) 10-12.5 MG tablet Take 1 tablet by mouth  daily.  . Multiple Vitamins-Minerals (MULTIVITAMIN ADULT PO) Take 1 tablet by mouth daily.  . Omega 3 1200 MG CAPS Take 1 capsule by mouth daily.  . traMADol (ULTRAM) 50 MG tablet Take 1 tablet (50 mg total) by mouth every 8 (eight) hours as needed.    Review of Systems  Constitutional: Negative for fever, chills, appetite change and fatigue.  Respiratory: Negative for chest tightness and shortness of breath.   Cardiovascular: Negative for chest pain and palpitations.  Gastrointestinal: Negative for nausea, vomiting and abdominal pain.  Musculoskeletal: Positive for myalgias (left leg), back pain and arthralgias.  Neurological: Negative for dizziness and weakness.    Social History  Substance Use Topics  . Smoking status: Former Smoker -- 0.50 packs/day for 15 years    Types: Cigarettes    Quit date: 06/13/1978  . Smokeless tobacco: Not on file  . Alcohol Use: 0.0 oz/week    0 Standard drinks or equivalent per week     Comment: occasional use; 1-2 times a year   Objective:   BP 130/80 mmHg  Pulse 74  Temp(Src) 98 F (36.7 C) (Oral)  Resp 16  Wt 234 lb (106.142 kg)  SpO2 98%  Physical Exam  General appearance: alert, well developed, well nourished, cooperative and in no distress Head: Normocephalic, without obvious abnormality, atraumatic Respiratory: Respirations even and unlabored, normal respiratory rate Extremities: Mild tenderness  left lateral paraspinous muscles. +4 LE strength, symmetric. Normal s/s/      Assessment & Plan:     1. Sciatica of left side She reports she has done well with prednisone in past. Start taper. Follow up Dr. Gavin Potters end of months as scheduled.  - predniSONE (DELTASONE) 10 MG tablet; 6 tablets for 2 days, then 5 for 2 days, then 4 for 2 days, then 3 for 2 days, then 2 for 2 days, then 1 daily until gone  Dispense: 50 tablet; Refill: 0   The entirety of the information documented in the History of Present Illness, Review of Systems and  Physical Exam were personally obtained by me. Portions of this information were initially documented by Awilda Bill, CMA and reviewed by me for thoroughness and accuracy.         Mila Merry, MD  [facility]

## 2016-01-11 DIAGNOSIS — R760 Raised antibody titer: Secondary | ICD-10-CM | POA: Diagnosis not present

## 2016-01-11 DIAGNOSIS — D649 Anemia, unspecified: Secondary | ICD-10-CM | POA: Diagnosis not present

## 2016-01-11 DIAGNOSIS — M150 Primary generalized (osteo)arthritis: Secondary | ICD-10-CM | POA: Diagnosis not present

## 2016-01-25 DIAGNOSIS — H35372 Puckering of macula, left eye: Secondary | ICD-10-CM | POA: Diagnosis not present

## 2016-01-25 DIAGNOSIS — H2513 Age-related nuclear cataract, bilateral: Secondary | ICD-10-CM | POA: Diagnosis not present

## 2016-03-28 DIAGNOSIS — Z23 Encounter for immunization: Secondary | ICD-10-CM | POA: Diagnosis not present

## 2016-04-27 ENCOUNTER — Telehealth: Payer: Self-pay | Admitting: Family Medicine

## 2016-04-27 NOTE — Telephone Encounter (Signed)
Called Pt to schedule AWV with [facility] - knb °

## 2016-04-30 DIAGNOSIS — N390 Urinary tract infection, site not specified: Secondary | ICD-10-CM | POA: Diagnosis not present

## 2016-06-28 ENCOUNTER — Other Ambulatory Visit: Payer: Self-pay | Admitting: Family Medicine

## 2016-07-11 ENCOUNTER — Telehealth: Payer: Self-pay | Admitting: Family Medicine

## 2016-07-11 ENCOUNTER — Ambulatory Visit
Admission: RE | Admit: 2016-07-11 | Discharge: 2016-07-11 | Disposition: A | Discharge status: Home / Self Care | Payer: Medicare Other | Source: Ambulatory Visit | Attending: Family Medicine | Admitting: Family Medicine

## 2016-07-11 ENCOUNTER — Encounter: Payer: Self-pay | Admitting: Family Medicine

## 2016-07-11 ENCOUNTER — Ambulatory Visit (INDEPENDENT_AMBULATORY_CARE_PROVIDER_SITE_OTHER): Payer: Medicare Other | Admitting: Family Medicine

## 2016-07-11 VITALS — BP 134/72 | HR 82 | Temp 98.0°F | Resp 24

## 2016-07-11 DIAGNOSIS — J209 Acute bronchitis, unspecified: Secondary | ICD-10-CM | POA: Diagnosis not present

## 2016-07-11 DIAGNOSIS — R05 Cough: Secondary | ICD-10-CM

## 2016-07-11 DIAGNOSIS — R0602 Shortness of breath: Secondary | ICD-10-CM | POA: Diagnosis not present

## 2016-07-11 DIAGNOSIS — R918 Other nonspecific abnormal finding of lung field: Secondary | ICD-10-CM | POA: Insufficient documentation

## 2016-07-11 DIAGNOSIS — R059 Cough, unspecified: Secondary | ICD-10-CM

## 2016-07-11 DIAGNOSIS — J440 Chronic obstructive pulmonary disease with acute lower respiratory infection: Secondary | ICD-10-CM | POA: Diagnosis not present

## 2016-07-11 MED ORDER — LEVOFLOXACIN 500 MG PO TABS: 500.0000 mg | ORAL_TABLET | Freq: Every day | ORAL | 0 refills | Status: DC

## 2016-07-11 MED ORDER — ALBUTEROL SULFATE HFA 108 (90 BASE) MCG/ACT IN AERS: 2.0000 | INHALATION_SPRAY | Freq: Four times a day (QID) | RESPIRATORY_TRACT | 2 refills | Status: DC | PRN

## 2016-07-11 NOTE — Telephone Encounter (Signed)
Please review. Thanks!  

## 2016-07-11 NOTE — Progress Notes (Signed)
Patient: Tonya Mcgee Female    DOB: 1941-01-16   76 y.o.   MRN: [id number] Visit Date: 07/11/2016  Today's Provider: Mila Merry, MD   Chief Complaint  Patient presents with  . URI   Subjective:    URI   This is a new problem. The current episode started in the past 7 days. The problem has been gradually worsening. There has been no fever. Associated symptoms include congestion, coughing and wheezing. She has tried nothing for the symptoms.  Cough started about 3 weeks ago, but starting getting short of breath with worsening cough about 3 days ago. Symptoms worsen when lying down.   No fever. Cough is non-productive. No lower extremity swelling  Wt Readings from Last 3 Encounters:  12/22/15 234 lb (106.1 kg)  10/06/15 210 lb (95.3 kg)  08/11/15 234 lb (106.1 kg)         Allergies  Allergen Reactions  . Amoxicillin Hives  . Ciprofloxacin Hives  . Codeine Other (See Comments)    Weird dreams  . Iodine Hives    Throat swelling  . Keflex  [Cephalexin]   . Pravastatin Nausea Only  . Tape     skin red irritated     Current Outpatient Prescriptions:  .  aspirin 81 MG tablet, Take 1 tablet by mouth daily., Disp: , Rfl:  .  atorvastatin (LIPITOR) 10 MG tablet, TAKE 1 TABLET DAILY, Disp: 90 tablet, Rfl: 4 .  cyclobenzaprine (FLEXERIL) 10 MG tablet, Take 1 tablet (10 mg total) by mouth at bedtime as needed (back pain)., Disp: 20 tablet, Rfl: 5 .  Ferrous Sulfate (IRON) 325 (65 Fe) MG TABS, Take 1 tablet by mouth daily., Disp: , Rfl:  .  hydroxychloroquine (PLAQUENIL) 200 MG tablet, Take 2 tablets by mouth daily., Disp: , Rfl:  .  levothyroxine (SYNTHROID, LEVOTHROID) 100 MCG tablet, TAKE ONE TABLET DAILY, Disp: 90 tablet, Rfl: 3 .  lisinopril-hydrochlorothiazide (PRINZIDE,ZESTORETIC) 10-12.5 MG tablet, TAKE 1 TABLET DAILY, Disp: 90 tablet, Rfl: 4 .  Multiple Vitamins-Minerals (MULTIVITAMIN ADULT PO), Take 1 tablet by mouth daily., Disp: , Rfl:  .  Omega 3 1200 MG  CAPS, Take 1 capsule by mouth daily., Disp: , Rfl:  .  traMADol (ULTRAM) 50 MG tablet, Take 1 tablet (50 mg total) by mouth every 8 (eight) hours as needed., Disp: 30 tablet, Rfl: 3 .  naproxen (NAPROSYN) 500 MG tablet, Take 1 tablet (500 mg total) by mouth 2 (two) times daily as needed. (Patient not taking: Reported on 12/22/2015), Disp: 30 tablet, Rfl: 0  Review of Systems  HENT: Positive for congestion.   Respiratory: Positive for cough and wheezing.     Social History  Substance Use Topics  . Smoking status: Former Smoker    Packs/day: 0.50    Years: 15.00    Types: Cigarettes    Quit date: 06/13/1978  . Smokeless tobacco: Never Used  . Alcohol use 0.0 oz/week     Comment: occasional use; 1-2 times a year   Objective:   BP 134/72 (BP Location: Right Arm, Patient Position: Sitting, Cuff Size: Large)   Pulse 82   Temp 98 F (36.7 C)   Resp (!) 24   SpO2 98%   Physical Exam  General Appearance:    Alert, cooperative, no distress  HENT:   ENT exam normal, no neck nodes or sinus tenderness  Eyes:    PERRL, conjunctiva/corneas clear, EOM's intact       Lungs:  Occasional expiratory wheeze, no rales, , respirations unlabored  Heart:    Regular rate and rhythm  Neurologic:   Awake, alert, oriented x 3. No apparent focal neurological           defect.      CXR- No acute infiltrate     Assessment & Plan:     1. Cough  - DG Chest 2 View; Future  2. Acute bronchitis with COPD (HCC)  - levofloxacin (LEVAQUIN) 500 MG tablet; Take 1 tablet (500 mg total) by mouth daily.  Dispense: 7 tablet; Refill: 0  Call if symptoms change or if not rapidly improving.    The entirety of the information documented in the History of Present Illness, Review of Systems and Physical Exam were personally obtained by me. Portions of this information were initially documented by Anson Oregon, CMA and reviewed by me for thoroughness and accuracy.        Mila Merry, MD  [facility]

## 2016-07-11 NOTE — Telephone Encounter (Signed)
Pt is requesting results from her x-ray this morning.  CB#[phone]/MW

## 2016-07-11 NOTE — Telephone Encounter (Signed)
Advised patient of results. Patient is also requesting an inhaler to help with her wheezing. Please advise. Thanks!

## 2016-07-11 NOTE — Patient Instructions (Signed)
Go to the [facility] on [address] for chest Xray  

## 2016-07-11 NOTE — Telephone Encounter (Signed)
-----   Message from Malva Limes, MD sent at 07/11/2016  3:41 PM EST ----- No pneumonia on XR. Have sent in prescription for levofloxacin for bronchitis.

## 2016-07-13 ENCOUNTER — Telehealth: Payer: Self-pay | Admitting: Family Medicine

## 2016-07-13 MED ORDER — PREDNISONE 10 MG PO TABS: ORAL_TABLET | ORAL | 0 refills | Status: AC

## 2016-07-13 NOTE — Telephone Encounter (Signed)
Have sent in rx for prednisone. If not rapidly improving on prednisone will need chest CT.

## 2016-07-13 NOTE — Telephone Encounter (Signed)
Pt had OV on 07/11/16 and has been taking medications as directed. Pt stated that she hasn't improved and still SOB when walking. Pt wanted to see what she should try and if she could get Prednisone sent to [facility] [address]. Please advise. Thanks TNP

## 2016-10-02 ENCOUNTER — Other Ambulatory Visit: Payer: Self-pay | Admitting: Family Medicine

## 2016-10-31 ENCOUNTER — Ambulatory Visit (INDEPENDENT_AMBULATORY_CARE_PROVIDER_SITE_OTHER): Payer: Medicare Other | Admitting: Family Medicine

## 2016-10-31 ENCOUNTER — Encounter: Payer: Self-pay | Admitting: Family Medicine

## 2016-10-31 VITALS — BP 120/70 | HR 75 | Temp 98.0°F | Resp 18 | Wt 215.0 lb

## 2016-10-31 DIAGNOSIS — J40 Bronchitis, not specified as acute or chronic: Secondary | ICD-10-CM | POA: Diagnosis not present

## 2016-10-31 DIAGNOSIS — R05 Cough: Secondary | ICD-10-CM | POA: Diagnosis not present

## 2016-10-31 DIAGNOSIS — R059 Cough, unspecified: Secondary | ICD-10-CM

## 2016-10-31 MED ORDER — PREDNISONE 10 MG PO TABS: ORAL_TABLET | ORAL | 0 refills | Status: AC

## 2016-10-31 MED ORDER — LEVOFLOXACIN 500 MG PO TABS: 500.0000 mg | ORAL_TABLET | Freq: Every day | ORAL | 0 refills | Status: DC

## 2016-10-31 NOTE — Progress Notes (Signed)
Patient: Tonya Mcgee Female    DOB: 17-Feb-1941   76 y.o.   MRN: [id number] Visit Date: 10/31/2016  Today's Provider: Mila Merry, MD   Chief Complaint  Patient presents with  . Cough  . Shortness of Breath  . Wheezing   Subjective:    Patient has had cough for 4 days. Patient states that she also has symptoms of sob, wheezing and productive cough. Sob and wheezing are worse when she walking. No fever or sore thraot.    Cough  This is a new problem. The current episode started in the past 7 days (4 days). The problem has been unchanged. The cough is productive of sputum. Associated symptoms include shortness of breath and wheezing. Pertinent negatives include no chest pain, chills, ear congestion, ear pain, fever, headaches, heartburn, hemoptysis, myalgias, nasal congestion, postnasal drip, rash, rhinorrhea, sore throat, sweats or weight loss. The symptoms are aggravated by exercise. She has tried nothing for the symptoms. Her past medical history is significant for bronchitis and pneumonia.      Allergies  Allergen Reactions  . Amoxicillin Hives  . Ciprofloxacin Hives  . Codeine Other (See Comments)    Weird dreams  . Iodine Hives    Throat swelling  . Keflex  [Cephalexin]   . Pravastatin Nausea Only  . Tape     skin red irritated     Current Outpatient Prescriptions:  .  albuterol (PROVENTIL HFA;VENTOLIN HFA) 108 (90 Base) MCG/ACT inhaler, Inhale 2 puffs into the lungs every 6 (six) hours as needed for wheezing or shortness of breath., Disp: 1 Inhaler, Rfl: 2 .  aspirin 81 MG tablet, Take 1 tablet by mouth daily., Disp: , Rfl:  .  atorvastatin (LIPITOR) 10 MG tablet, TAKE 1 TABLET DAILY, Disp: 90 tablet, Rfl: 4 .  cyclobenzaprine (FLEXERIL) 10 MG tablet, Take 1 tablet (10 mg total) by mouth at bedtime as needed (back pain)., Disp: 20 tablet, Rfl: 5 .  Ferrous Sulfate (IRON) 325 (65 Fe) MG TABS, Take 1 tablet by mouth daily., Disp: , Rfl:  .   hydroxychloroquine (PLAQUENIL) 200 MG tablet, Take 2 tablets by mouth daily., Disp: , Rfl:  .  levothyroxine (SYNTHROID, LEVOTHROID) 100 MCG tablet, TAKE ONE TABLET DAILY, Disp: 90 tablet, Rfl: 3 .  lisinopril-hydrochlorothiazide (PRINZIDE,ZESTORETIC) 10-12.5 MG tablet, TAKE 1 TABLET DAILY, Disp: 90 tablet, Rfl: 4 .  Multiple Vitamins-Minerals (MULTIVITAMIN ADULT PO), Take 1 tablet by mouth daily., Disp: , Rfl:  .  naproxen (NAPROSYN) 500 MG tablet, Take 1 tablet (500 mg total) by mouth 2 (two) times daily as needed., Disp: 30 tablet, Rfl: 0 .  Omega 3 1200 MG CAPS, Take 1 capsule by mouth daily., Disp: , Rfl:  .  traMADol (ULTRAM) 50 MG tablet, Take 1 tablet (50 mg total) by mouth every 8 (eight) hours as needed., Disp: 30 tablet, Rfl: 3  Review of Systems  Constitutional: Negative for appetite change, chills, fatigue, fever and weight loss.  HENT: Positive for congestion. Negative for ear pain, postnasal drip, rhinorrhea, sinus pain, sinus pressure and sore throat.   Respiratory: Positive for cough, shortness of breath and wheezing. Negative for hemoptysis and chest tightness.   Cardiovascular: Negative for chest pain and palpitations.  Gastrointestinal: Negative for abdominal pain, heartburn, nausea and vomiting.  Musculoskeletal: Negative for myalgias.  Skin: Negative for rash.  Neurological: Negative for dizziness, weakness and headaches.    Social History  Substance Use Topics  . Smoking status: Former Smoker  Packs/day: 0.50    Years: 15.00    Types: Cigarettes    Quit date: 06/13/1978  . Smokeless tobacco: Never Used  . Alcohol use 0.0 oz/week     Comment: occasional use; 1-2 times a year   Objective:   BP 120/70 (BP Location: Right Arm, Patient Position: Sitting, Cuff Size: Large)   Pulse 75   Temp 98 F (36.7 C) (Oral)   Resp 18   Wt 215 lb (97.5 kg)   SpO2 94%   BMI 39.32 kg/m  Vitals:   10/31/16 1524  BP: 120/70  Pulse: 75  Resp: 18  Temp: 98 F (36.7 C)    TempSrc: Oral  SpO2: 94%  Weight: 215 lb (97.5 kg)     Physical Exam  General Appearance:    Alert, cooperative, no distress  HENT:   ENT exam normal, no neck nodes or sinus tenderness  Eyes:    PERRL, conjunctiva/corneas clear, EOM's intact       Lungs:     Diffuse wheezing and rhonchi. No rales, respirations unlabored  Heart:    Regular rate and rhythm  Neurologic:   Awake, alert, oriented x 3. No apparent focal neurological           defect.           Assessment & Plan:     1. Bronchitis She reports she has taken both levofloxacin and prednisone in the past without adverse effects, and that prednisone worked very well for her cough and wheezing.  - predniSONE (DELTASONE) 10 MG tablet; 6 tablets for 2 days, then 5 for 2 days, then 4 for 2 days, then 3 for 2 days, then 2 for 2 days, then 1 for 2 days.  Dispense: 42 tablet; Refill: 0 - levofloxacin (LEVAQUIN) 500 MG tablet; Take 1 tablet (500 mg total) by mouth daily.  Dispense: 7 tablet; Refill: 0  2. Cough  Call if symptoms change or if not rapidly improving.         The entirety of the information documented in the History of Present Illness, Review of Systems and Physical Exam were personally obtained by me. Portions of this information were initially documented by April M. Hyacinth Meeker, CMA and reviewed by me for thoroughness and accuracy.    Mila Merry, MD  [facility]

## 2016-10-31 NOTE — Patient Instructions (Signed)

## 2016-11-18 ENCOUNTER — Ambulatory Visit: Payer: Medicare Other | Admitting: Family Medicine

## 2016-11-18 ENCOUNTER — Ambulatory Visit: Payer: Medicare Other

## 2016-11-24 ENCOUNTER — Ambulatory Visit: Payer: Medicare Other

## 2016-11-29 ENCOUNTER — Ambulatory Visit: Payer: Medicare Other

## 2016-11-29 ENCOUNTER — Ambulatory Visit: Payer: Medicare Other | Admitting: Family Medicine

## 2016-12-15 ENCOUNTER — Other Ambulatory Visit: Payer: Self-pay | Admitting: Family Medicine

## 2016-12-15 NOTE — Telephone Encounter (Signed)
[facility] faxed a request for 90-day supply on the following medication. Thanks CC  albuterol (PROVENTIL HFA;VENTOLIN HFA) 108 (90 Base) MCG/ACT inhaler  QTY: 90 Refills: 4

## 2016-12-15 NOTE — Telephone Encounter (Signed)
Please review-aa  90 day supply request

## 2016-12-16 MED ORDER — ALBUTEROL SULFATE HFA 108 (90 BASE) MCG/ACT IN AERS: 2.0000 | INHALATION_SPRAY | Freq: Four times a day (QID) | RESPIRATORY_TRACT | 2 refills | Status: DC | PRN

## 2016-12-28 ENCOUNTER — Ambulatory Visit: Payer: Medicare Other

## 2016-12-28 ENCOUNTER — Ambulatory Visit: Payer: Medicare Other | Admitting: Family Medicine

## 2017-01-06 ENCOUNTER — Other Ambulatory Visit: Payer: Self-pay

## 2017-01-11 ENCOUNTER — Encounter: Payer: Self-pay | Admitting: Family Medicine

## 2017-01-18 ENCOUNTER — Telehealth: Payer: Self-pay | Admitting: Family Medicine

## 2017-01-18 NOTE — Telephone Encounter (Signed)
Recommend it. Is a medicare pharmacy benefit, not a medical benefit so she will have to get from pharmacy.  

## 2017-01-18 NOTE — Telephone Encounter (Signed)
Pt is asking about having the new shingles vaccine done.  CB#[phone]/MW °

## 2017-01-18 NOTE — Telephone Encounter (Signed)
Please review-aa 

## 2017-01-19 NOTE — Telephone Encounter (Signed)
Returned call and discuss new shingle vaccine with pt's husband Dimas Aguas.

## 2017-01-24 DIAGNOSIS — G8929 Other chronic pain: Secondary | ICD-10-CM | POA: Diagnosis not present

## 2017-01-24 DIAGNOSIS — M0579 Rheumatoid arthritis with rheumatoid factor of multiple sites without organ or systems involvement: Secondary | ICD-10-CM | POA: Diagnosis not present

## 2017-01-24 DIAGNOSIS — R760 Raised antibody titer: Secondary | ICD-10-CM | POA: Diagnosis not present

## 2017-01-24 DIAGNOSIS — G5602 Carpal tunnel syndrome, left upper limb: Secondary | ICD-10-CM | POA: Diagnosis not present

## 2017-01-24 DIAGNOSIS — D649 Anemia, unspecified: Secondary | ICD-10-CM | POA: Diagnosis not present

## 2017-01-24 DIAGNOSIS — M25512 Pain in left shoulder: Secondary | ICD-10-CM | POA: Diagnosis not present

## 2017-01-24 DIAGNOSIS — M150 Primary generalized (osteo)arthritis: Secondary | ICD-10-CM | POA: Diagnosis not present

## 2017-01-24 DIAGNOSIS — M545 Low back pain: Secondary | ICD-10-CM | POA: Diagnosis not present

## 2017-02-06 ENCOUNTER — Encounter: Payer: Self-pay | Admitting: Family Medicine

## 2017-02-06 ENCOUNTER — Ambulatory Visit (INDEPENDENT_AMBULATORY_CARE_PROVIDER_SITE_OTHER): Payer: Medicare Other | Admitting: Family Medicine

## 2017-02-06 VITALS — BP 130/68 | HR 77 | Temp 98.3°F | Resp 16

## 2017-02-06 DIAGNOSIS — E018 Other iodine-deficiency related thyroid disorders and allied conditions: Secondary | ICD-10-CM

## 2017-02-06 DIAGNOSIS — R609 Edema, unspecified: Secondary | ICD-10-CM

## 2017-02-06 DIAGNOSIS — R0600 Dyspnea, unspecified: Secondary | ICD-10-CM | POA: Diagnosis not present

## 2017-02-06 DIAGNOSIS — G8929 Other chronic pain: Secondary | ICD-10-CM | POA: Diagnosis not present

## 2017-02-06 DIAGNOSIS — I10 Essential (primary) hypertension: Secondary | ICD-10-CM | POA: Diagnosis not present

## 2017-02-06 DIAGNOSIS — M545 Low back pain: Secondary | ICD-10-CM

## 2017-02-06 DIAGNOSIS — M1990 Unspecified osteoarthritis, unspecified site: Secondary | ICD-10-CM | POA: Diagnosis not present

## 2017-02-06 MED ORDER — FUROSEMIDE 20 MG PO TABS: 20.0000 mg | ORAL_TABLET | Freq: Every day | ORAL | 3 refills | Status: DC

## 2017-02-06 MED ORDER — TRAMADOL HCL 50 MG PO TABS: 50.0000 mg | ORAL_TABLET | Freq: Three times a day (TID) | ORAL | 3 refills | Status: DC | PRN

## 2017-02-06 MED ORDER — CYCLOBENZAPRINE HCL 5 MG PO TABS: 5.0000 mg | ORAL_TABLET | Freq: Every day | ORAL | 1 refills | Status: DC

## 2017-02-06 NOTE — Progress Notes (Signed)
Patient: Tonya Mcgee Female    DOB: Mar 11, 1941   76 y.o.   MRN: [id number] Visit Date: 02/06/2017  Today's Provider: Mila Merry, MD   Chief Complaint  Patient presents with  . Edema   Subjective:    Patient has had bilateral swelling in legs for 1 1/2 weeks. Swelling is from knees down to feet. Patient stated her legs were red but that has since faded. Patient also stated that both legs are very sore.   States she has been taking more Advil lately due to low back pain, usually about 6 a day. Was previously prescribed cyclobenzaprine and tramadol which worked well for her.   Wt Readings from Last 3 Encounters:  10/31/16 215 lb (97.5 kg)  12/22/15 234 lb (106.1 kg)  10/06/15 210 lb (95.3 kg)      Allergies  Allergen Reactions  . Amoxicillin Hives  . Ciprofloxacin Hives  . Codeine Other (See Comments)    Weird dreams  . Iodine Hives    Throat swelling  . Keflex  [Cephalexin]   . Pravastatin Nausea Only  . Tape     skin red irritated     Current Outpatient Prescriptions:  .  albuterol (PROVENTIL HFA;VENTOLIN HFA) 108 (90 Base) MCG/ACT inhaler, Inhale 2 puffs into the lungs every 6 (six) hours as needed for wheezing or shortness of breath., Disp: 1 Inhaler, Rfl: 2 .  aspirin 81 MG tablet, Take 1 tablet by mouth daily., Disp: , Rfl:  .  atorvastatin (LIPITOR) 10 MG tablet, TAKE 1 TABLET DAILY, Disp: 90 tablet, Rfl: 4 .  Ferrous Sulfate (IRON) 325 (65 Fe) MG TABS, Take 1 tablet by mouth daily., Disp: , Rfl:  .  hydroxychloroquine (PLAQUENIL) 200 MG tablet, Take 2 tablets by mouth daily., Disp: , Rfl:  .  levothyroxine (SYNTHROID, LEVOTHROID) 100 MCG tablet, TAKE ONE TABLET DAILY, Disp: 90 tablet, Rfl: 3 .  lisinopril-hydrochlorothiazide (PRINZIDE,ZESTORETIC) 10-12.5 MG tablet, TAKE 1 TABLET DAILY, Disp: 90 tablet, Rfl: 4 .  Multiple Vitamins-Minerals (MULTIVITAMIN ADULT PO), Take 1 tablet by mouth daily., Disp: , Rfl:  .  naproxen (NAPROSYN) 500 MG tablet, Take  1 tablet (500 mg total) by mouth 2 (two) times daily as needed., Disp: 30 tablet, Rfl: 0 .  Omega 3 1200 MG CAPS, Take 1 capsule by mouth daily., Disp: , Rfl:   Review of Systems  Constitutional: Negative for appetite change, chills, fatigue and fever.  Respiratory: Negative for chest tightness and shortness of breath.   Cardiovascular: Positive for leg swelling (bilateral). Negative for chest pain and palpitations.  Gastrointestinal: Negative for abdominal pain, nausea and vomiting.  Neurological: Negative for dizziness and weakness.    Social History  Substance Use Topics  . Smoking status: Former Smoker    Packs/day: 0.50    Years: 15.00    Types: Cigarettes    Quit date: 06/13/1978  . Smokeless tobacco: Never Used  . Alcohol use 0.0 oz/week     Comment: occasional use; 1-2 times a year   Objective:   BP 130/68 (BP Location: Right Arm, Patient Position: Sitting, Cuff Size: Large)   Pulse 77   Temp 98.3 F (36.8 C) (Oral)   Resp 16   SpO2 99%  Vitals:   02/06/17 1645  BP: 130/68  Pulse: 77  Resp: 16  Temp: 98.3 F (36.8 C)  TempSrc: Oral  SpO2: 99%     Physical Exam   General Appearance:    Alert, cooperative, no distress  Eyes:    PERRL, conjunctiva/corneas clear, EOM's intact       Lungs:     Clear to auscultation bilaterally, respirations unlabored  Heart:    Regular rate and rhythm, II/VI systolic murmur.   Neurologic:   Awake, alert, oriented x 3. No apparent focal neurological           defect.   Ext:  3+ bilateral LE pitting edema to knees, mildly tender, no erythema.        Assessment & Plan:     1. Edema, unspecified type Multifactorial, likely aggravated by increase use if NSAIds. Check labs as below.  - furosemide (LASIX) 20 MG tablet; Take 1 tablet (20 mg total) by mouth daily.  Dispense: 30 tablet; Refill: 3 - Brain natriuretic peptide  2. Essential hypertension  - Comprehensive metabolic panel - CBC   3. Hypothyroidism, iodine - T4 AND  TSH  4. Dyspnea, unspecified type  - Brain natriuretic peptide  5. Osteoarthritis, unspecified osteoarthritis type, unspecified site  - traMADol (ULTRAM) 50 MG tablet; Take 1 tablet (50 mg total) by mouth every 8 (eight) hours as needed.  Dispense: 30 tablet; Refill: 3 - cyclobenzaprine (FLEXERIL) 5 MG tablet; Take 1 tablet (5 mg total) by mouth at bedtime.  Dispense: 30 tablet; Refill: 1  6. Chronic midline low back pain without sciatica Did well with tramadol and cyclobenzaprine in the past as above.      The entirety of the information documented in the History of Present Illness, Review of Systems and Physical Exam were personally obtained by me. Portions of this information were initially documented by April M. Hyacinth Meeker, CMA and reviewed by me for thoroughness and accuracy.    Mila Merry, MD  [facility]

## 2017-02-07 ENCOUNTER — Telehealth: Payer: Self-pay

## 2017-02-07 DIAGNOSIS — G8929 Other chronic pain: Secondary | ICD-10-CM

## 2017-02-07 DIAGNOSIS — R0600 Dyspnea, unspecified: Secondary | ICD-10-CM | POA: Diagnosis not present

## 2017-02-07 DIAGNOSIS — D649 Anemia, unspecified: Secondary | ICD-10-CM | POA: Diagnosis not present

## 2017-02-07 DIAGNOSIS — M549 Dorsalgia, unspecified: Secondary | ICD-10-CM | POA: Insufficient documentation

## 2017-02-07 DIAGNOSIS — M545 Low back pain: Principal | ICD-10-CM

## 2017-02-07 DIAGNOSIS — I10 Essential (primary) hypertension: Secondary | ICD-10-CM | POA: Diagnosis not present

## 2017-02-07 DIAGNOSIS — R609 Edema, unspecified: Secondary | ICD-10-CM | POA: Diagnosis not present

## 2017-02-07 NOTE — Telephone Encounter (Signed)
Please review-aa 

## 2017-02-07 NOTE — Telephone Encounter (Signed)
Please advise 

## 2017-02-07 NOTE — Telephone Encounter (Signed)
Tonya Mcgee is requesting PT referral. Patient reports back pain is not improving. CB# [phone]

## 2017-02-08 ENCOUNTER — Telehealth: Payer: Self-pay

## 2017-02-08 DIAGNOSIS — D509 Iron deficiency anemia, unspecified: Secondary | ICD-10-CM

## 2017-02-08 LAB — CBC
HEMATOCRIT: 27 % — AB (ref 34.0–46.6)
Hemoglobin: 8.6 g/dL — ABNORMAL LOW (ref 11.1–15.9)
MCH: 29.8 pg (ref 26.6–33.0)
MCHC: 31.9 g/dL (ref 31.5–35.7)
MCV: 93 fL (ref 79–97)
Platelets: 293 10*3/uL (ref 150–379)
RBC: 2.89 x10E6/uL — ABNORMAL LOW (ref 3.77–5.28)
RDW: 13.6 % (ref 12.3–15.4)
WBC: 6.9 10*3/uL (ref 3.4–10.8)

## 2017-02-08 LAB — COMPREHENSIVE METABOLIC PANEL
ALBUMIN: 3.4 g/dL — AB (ref 3.5–4.8)
ALK PHOS: 71 IU/L (ref 39–117)
ALT: 14 IU/L (ref 0–32)
AST: 24 IU/L (ref 0–40)
Albumin/Globulin Ratio: 1.2 (ref 1.2–2.2)
BUN/Creatinine Ratio: 18 (ref 12–28)
BUN: 20 mg/dL (ref 8–27)
CHLORIDE: 94 mmol/L — AB (ref 96–106)
CO2: 20 mmol/L (ref 20–29)
Calcium: 8.8 mg/dL (ref 8.7–10.3)
Creatinine, Ser: 1.1 mg/dL — ABNORMAL HIGH (ref 0.57–1.00)
GFR calc Af Amer: 56 mL/min/{1.73_m2} — ABNORMAL LOW (ref >59)
GFR calc non Af Amer: 49 mL/min/{1.73_m2} — ABNORMAL LOW (ref >59)
GLUCOSE: 101 mg/dL — AB (ref 65–99)
Globulin, Total: 2.8 g/dL (ref 1.5–4.5)
Potassium: 4.5 mmol/L (ref 3.5–5.2)
Sodium: 130 mmol/L — ABNORMAL LOW (ref 134–144)
Total Protein: 6.2 g/dL (ref 6.0–8.5)

## 2017-02-08 LAB — BRAIN NATRIURETIC PEPTIDE: BNP: 169.1 pg/mL — AB (ref 0.0–100.0)

## 2017-02-08 LAB — T4 AND TSH
T4 TOTAL: 7.1 ug/dL (ref 4.5–12.0)
TSH: 4.07 u[IU]/mL (ref 0.450–4.500)

## 2017-02-08 NOTE — Telephone Encounter (Signed)
Called [facility] to add on Ferritin. Pt advised as below. Agrees to GI referral. Who would you prefer for the referral?

## 2017-02-08 NOTE — Telephone Encounter (Signed)
-----   Message from Malva Limes, MD sent at 02/08/2017  1:50 PM EDT ----- Her blood count has dropped to 8.6. This could be contributing to her swelling. She needs to double her dose of iron. Please add ferritin to current labs.  She needs referral to Gi to see if she is losing blood from GI tract.   Need to recheck CBC in 2 weeks, to make sure it is stable.

## 2017-02-10 LAB — FERRITIN: FERRITIN: 36 ng/mL (ref 15–150)

## 2017-02-10 LAB — SPECIMEN STATUS REPORT

## 2017-02-16 ENCOUNTER — Encounter: Payer: Self-pay | Admitting: Gastroenterology

## 2017-02-20 DIAGNOSIS — Z79899 Other long term (current) drug therapy: Secondary | ICD-10-CM | POA: Diagnosis not present

## 2017-02-20 DIAGNOSIS — H2513 Age-related nuclear cataract, bilateral: Secondary | ICD-10-CM | POA: Diagnosis not present

## 2017-02-20 DIAGNOSIS — M069 Rheumatoid arthritis, unspecified: Secondary | ICD-10-CM | POA: Diagnosis not present

## 2017-02-23 ENCOUNTER — Encounter: Payer: Self-pay | Admitting: Physical Therapy

## 2017-02-23 ENCOUNTER — Ambulatory Visit: Payer: Medicare Other | Attending: Family Medicine | Admitting: Physical Therapy

## 2017-02-23 DIAGNOSIS — M6281 Muscle weakness (generalized): Secondary | ICD-10-CM

## 2017-02-23 DIAGNOSIS — M25551 Pain in right hip: Secondary | ICD-10-CM

## 2017-02-23 DIAGNOSIS — M545 Low back pain, unspecified: Secondary | ICD-10-CM

## 2017-02-23 DIAGNOSIS — G8929 Other chronic pain: Secondary | ICD-10-CM | POA: Insufficient documentation

## 2017-02-23 DIAGNOSIS — R262 Difficulty in walking, not elsewhere classified: Secondary | ICD-10-CM

## 2017-02-23 NOTE — Patient Instructions (Addendum)
Outer Hip Stretch: Figure Four (Wall)    Hips centered (do not jockey to side), adjust distance from wall and bend of supporting leg for less deep stretch. Hold for  30 sec. Repeat __2__ times each leg.  Copyright  VHI. All rights reserved.  Hip Flexor Stretch - Standing    Right leg behind, foot facing forward, left knee bent to 90. Place hands on wall. Bend right knee directly under hip. Squeeze buttock muscles and push hips forward. Hold position for _10__ breaths. Repeat _2__ times. Repeat with other leg. Do __2_ times per day.  Copyright  VHI. All rights reserved.  Hip Flexor Stretch    Lying on back near edge of bed, bend one leg, foot flat. Hang other leg over edge, relaxed, thigh resting entirely on bed for __1__ minutes. Repeat __2__ times. Do __2__ sessions per day. Advanced Exercise: Bend knee back keeping thigh in contact with bed.  http://gt2.exer.us/347   Copyright  VHI. All rights reserved.  Leg Lift With Lateral Hip Stretch    Rotate leg upward and then lift. Then lower it beyond other leg. Hold _30__ seconds. Do not let pelvis move. Do __2_ times, each leg, _2__ times per day.  http://ss.exer.us/79   Copyright  VHI. All rights reserved.

## 2017-02-23 NOTE — Therapy (Signed)
[facility] MAIN [facility] SERVICES [address] [place], [place], [address] Phone: [phone]   Fax:  [phone]  Physical Therapy Evaluation  Patient Details  Name: Tonya Mcgee MRN: [id number] Date of Birth: Mar 28, 1941 Referring Provider: Dr. Sherrie Mustache  Encounter Date: 02/23/2017      PT End of Session - 02/23/17 1214    Visit Number 1   Number of Visits 17   Date for PT Re-Evaluation 05/03/17   Authorization Type G codes 1   PT Start Time 1025   PT Stop Time 1124   PT Time Calculation (min) 59 min   Equipment Utilized During Treatment Gait belt   Activity Tolerance Patient limited by pain;Patient limited by fatigue  Pt had increased pain following exam due to being in provocative postures   Behavior During Therapy [facility] for tasks assessed/performed      Past Medical History:  Diagnosis Date  . History of chicken pox   . History of measles   . History of mumps     Past Surgical History:  Procedure Laterality Date  . APPENDECTOMY  1960  . Bone Density Study  2011   within normal limits per patient report  . Carotid doppler ultrasound  11/18/2013   mild bilateral plaque formation. No hemodynamically significant stenosis  . CARPAL TUNNEL RELEASE Right 1990  . Colonoscopy Screening  2011   normal per patient report  . KNEE SURGERY Left 1989   arthroscopic  . mammogram screening  2012   normal per patient  . pap smear  2012   normal per patient  . TONSILLECTOMY  1946    There were no vitals filed for this visit.       Subjective Assessment - 02/23/17 1019    Subjective Pt reports that she does not have LBP at this time and is primarly botherered by R hip pain.    Pertinent History Pt reports that for several months she has had chronic recurent LBP. In the last week she reports that the back pain has moved to the R hip. She has had multiple episodes of back pain in the past; in 7/17 pt received PT for LBP with L sided  sciatica that resolved with manual therapy and core stability exercise, per pt. Pt ambulates with 3 wheel Rollator, which she began using in the last 5-6 months due to knee pain related to RA. Previously she used a SPC. 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 Pt reports increased pain in extended postures.    Limitations Standing;Walking;House hold activities   How long can you sit comfortably? Unlimited with cushion    How long can you stand comfortably? several minutes; pt sits when performing most household ADLs   How long can you walk comfortably? several hundred feet with progressively flexed posture.    Diagnostic tests X-Ray 5/17 IMPRESSION: Lumbar spine scoliosis and multilevel degenerative change.   Patient Stated Goals To have less pain to tolerate activities in standing and walking for increased participation with family activities.   Currently in Pain? Yes   Pain Score 3    Pain Location Hip   Pain Orientation Right   Pain Descriptors / Indicators Sharp   Pain Type Acute pain   Pain Onset 1 to 4 weeks ago   Pain Frequency Intermittent   Aggravating Factors  Standing, walking, extended postures   Pain Relieving Factors Sitting, advil, muscle relaxors  Effect of Pain on Daily Activities Decreased standing activity.   Multiple Pain Sites No            [facility] PT Assessment - 02/24/17 0001      Assessment   Medical Diagnosis Low back pain   Referring Provider Dr. Sherrie Mustache   Onset Date/Surgical Date --  About 3 months ago; chronic recurrent   Hand Dominance Right   Next MD Visit Next week   Prior Therapy 7/17 for LBP with sciatica, which resolved completely with manual therapy and core stability exercise     Precautions   Precautions Fall     Restrictions   Weight Bearing Restrictions No     Balance Screen   Has the patient fallen in the past 6 months No   Has the patient had a decrease in activity level because  of a fear of falling?  Yes   Is the patient reluctant to leave their home because of a fear of falling?  Yes     Home Environment   Living Environment Private residence   Living Arrangements Spouse/significant other   Type of Home House   Home Access Level entry   Home Layout One level   Home Equipment Walker - 4 wheels;Cane - single point;Wheelchair - manual  Pt uses 3-wheeled walker primarily    Additional Comments Pt has walk in shower with shower chair built-in, and grab bars in shower and at cammode     Prior Function   Level of Independence Independent with basic ADLs  Has a cleaning person to help   Vocation Retired   [employer], was a Charity fundraiser   Leisure activities with family requiring walking and standing.      Cognition   Overall Cognitive Status Within Functional Limits for tasks assessed          PROM/AROM:  Upper Extremity: WFL  Lower extremity: Limited hip extension; can achieve neutral with pain in low back.  LUMBAR AROM in standing        Norm values Flexion  15 deg    60 deg Extension 5 deg from neutral stance  25 deg  Lateral flexion   5-10 deg    25 deg       STRENGTH:  Graded on a 0-5 scale Muscle Group Left Right  Gross Upper Extremity 4- 4  Hip Flex 4 Pain in hip 3+  Hip Abd 4 4  Hip Add 4   Hip Ext 2+ 2+      Knee Flex 4+ Pain 4-  Knee Ext 4 4  Ankle DF 4+ 4+       Sensory:  Light touch: Appears intact in BLEs with gross assessment. Pt reports numbness in her LUE due to carpal tunnel. She wears a fixed brace on L wrist.     Coordination:   No gross abnormalities with functional assessment.  Observations:   Posture: Severely forward flexed in standing with forward head and rounded shoulders and increased thoracic kyphosis. Slight prominence of ribs on R side. Iliac crests are grossly even height bilaterally. Ankles are mildly swollen bilaterally with decreased skin elasticity.   BALANCE:   Pt withstands very light  perturbation with increased sway in comfortable stance without UE support; unable to withstand perturbation in Romberg stance; Can achieve semi-tandem with UE assist and hold for 10 sec with increased sway and CGA for safety.  Static Standing Balance  Normal Able to maintain standing balance against maximal resistance   Good  Able to maintain standing balance against moderate resistance   Good-/Fair+ Able to maintain standing balance against minimal resistance X  Fair Able to stand unsupported without UE support and without LOB for 1-2 min   Fair- Requires Min A and UE support to maintain standing without loss of balance   Poor+ Requires mod A and UE support to maintain standing without loss of balance   Poor Requires max A and UE support to maintain standing balance without loss     Standing Dynamic Balance  Normal Stand independently unsupported, able to weight shift and cross midline maximally   Good Stand independently unsupported, able to weight shift and cross midline moderately   Good-/Fair+ Stand independently unsupported, able to weight shift across midline minimally   Fair Stand independently unsupported, weight shift, and reach ipsilaterally, loss of balance when crossing midline X  Poor+ Able to stand with Min A and reach ipsilaterally, unable to weight shift   Poor+ Able to stand with Mod A and minimally reach ipsilaterally, unable to cross midline.        SPECIAL TESTS:     SLS: Stance on L increases R buttock pain  Palpation:   Greater trochanter: no tenderness to palpation     PSIS painful to palpation on R    Increased tone in lumbar paraspinals  FABER:  Left: severe tightness with no pain. (-)     Right: Posterior hip pain and tightness (+)  Piriformis stretch in figure 4: reproduces pain in R hip and buttocks       FUNCTIONAL MOBILITY:   Transfers: Pt requires BUE support for sit<>stand and she does not control her descent well. Pt unable to achieve erect  posture on standing due to severe extension limitation.   Gait: Pt ampulates with severely flexed posture at the hips and in the lumbar spine with forearms resting on 3-wheeled Rollator handles. Pt does not lock her Rollator in opened position and does not operate breaks either dynamically or when stopped. Gait pattern is reciprocal with decreased stride length. Posture gets progressively more forward flexed as pain and fatigue increase. Pt has WC for community mobility that she uses on occasion   OUTCOME MEASURES: TEST Outcome Interpretation  5 times sit<>stand       17.5 sec with BUE support Indicates increased risk for falls     10 meter walk test             0.62 m/s with 3-wheeled Rollator Indicates that pt is a limited community ambulator and would benefit from intervention    Modified Oswestry                 52/100 Pt is 52% disabled based on her perceived low back pain.               Objective measurements completed on examination: See above findings.      Treatment:  There-ex:   Hip flexor stretches in standing and in supine      Piriformis stretch     Hip abductor stretch in sidelying.  Patient required min-moderate verbal/tactile cues for correct exercise technique and positioning; Instructed in safe position with hip flexor stretch to reduce back pain;    (See patient instructions for details)            PT Education - 02/23/17 1213    Education provided Yes   Education Details Plan of care, outcome interpretation, ther-ex   Person(s) Educated Patient   Methods  Explanation;Demonstration;Verbal cues;Tactile cues;Handout   Comprehension Tactile cues required;Verbal cues required;Verbalized understanding;Returned demonstration          PT Short Term Goals - 02/24/17 1018      PT SHORT TERM GOAL #1   Title  Patient will improve lumbar ROM by 6 degrees in all directions in order to improve functional mobility and reduce pain.    Time 4   Period  Weeks   Status New   Target Date 03/24/17     PT SHORT TERM GOAL #2   Title Patient will be adherent to HEP 3/7 days in order to improve range of motion/strength for overall better functional mobility.    Time 4   Period Weeks   Status New   Target Date 03/24/17           PT Long Term Goals - 02/23/17 1828      PT LONG TERM GOAL #1   Title Patient will be independent in home exercise program to improve strength/mobility for better functional independence with ADLs.   Time 8   Period Weeks   Status New   Target Date 04/20/17     PT LONG TERM GOAL #2   Title Patient will report a worst pain of 3/10 on VAS in low back and right hip to improve tolerance with ADLs and reduced symptoms with activities.    Time 8   Period Weeks   Status New   Target Date 04/20/17     PT LONG TERM GOAL #3   Title Patient will increase BLE gross strength to 4+/5 as to improve functional strength for independent gait, increased standing tolerance and increased ADL ability   Time 8   Period Weeks   Status New   Target Date 04/20/17     PT LONG TERM GOAL #4   Title Patient (> 5 years old) will complete five times sit to stand test in < 15 seconds indicating an increased LE strength and improved balance.   Baseline 17.5 sec with BUE support; Indicates increased risk for falls    Time 8   Period Weeks   Status New   Target Date 04/20/17     PT LONG TERM GOAL #5   Title Patient will increase 10 meter walk test to >0.102m/s as to improve gait speed for better community ambulation and to reduce fall risk.   Baseline  0.62 m/s with 3-wheeled Rollator Indicates that pt is a limited community ambulator   Time 8   Period Weeks   Status New   Target Date 04/20/17     Additional Long Term Goals   Additional Long Term Goals Yes     PT LONG TERM GOAL #6   Title Patient will reduce modified Oswestry score to <20 as to demonstrate minimal disability with ADLs including improved sleeping tolerance,  walking/sitting tolerance etc for better mobility with ADLs.    Baseline 52/100: Pt is 52% disabled based on her perceived low back pain.    Time 8   Period Weeks   Status New   Target Date 04/20/17                Plan - 02/23/17 1216    Clinical Impression Statement  76 y/o female presents to therapy with a referral for chronic recurrent LBP. Today she reports that the pain has primarily moved into her R hip and buttock with decreased pain in the low back. X-ray does show scoliosis with multiple spinal level degenerative  changes. Pt has severely forward flexed posture and walks with 3-wheeled Rollator. Hip flexors are very tight with FABER. Exam revealed pt has decreased strength in UEs and LEs bilaterally, and is pain limited with several motions on the RLE. 5x sit<>stand and 33m walk test indicate that pt has an increased fall risk and is a limited community ambulator in need of intervention. Modified Oswestry indicates that the pt perceives herself to be 52% disabled by low back pain. Pt has had back pain in the past, which has responded well to PT with manual therapy and core stability exercise, per pt. Pt's pain was reproduced with piriformis stretch in supine. Posterior hip pain noted with FABER. Further assessment required to rule out SI pathology. Pt is appropriate for skilled therapy at this time in order to maximize her functional mobility and safety.   History and Personal Factors relevant to plan of care:  (-) advanced age, severity of postural abnormality, fall risk, multiple musculoskeletal diagnoses, chronicity of deficit (+) mod- Independent with mobility, no Hx of falls, prior success with PT   Clinical Presentation Evolving   Clinical Presentation due to: low back pain that has radiated pain into the right hip with multiple joint impairments;    Clinical Decision Making Moderate   Rehab Potential Fair   Clinical Impairments Affecting Rehab Potential Multiple comorbidities    PT Frequency 2x / week   PT Duration 8 weeks   PT Treatment/Interventions ADLs/Self Care Home Management;Aquatic Therapy;Biofeedback;Cryotherapy;DME Instruction;Gait training;Stair training;Functional mobility training;Neuromuscular re-education;Balance training;Therapeutic exercise;Therapeutic activities;Patient/family education;Manual techniques;Passive range of motion;Energy conservation;Taping;Moist Heat   PT Next Visit Plan Rule out SI pathology, Pelvic tilts, Ask about contrainications to aquatic therapy   PT Home Exercise Plan Stretching for hip flexors, abductors, piriformis   Consulted and Agree with Plan of Care Patient      Patient will benefit from skilled therapeutic intervention in order to improve the following deficits and impairments:  Abnormal gait, Decreased activity tolerance, Decreased balance, Decreased endurance, Decreased knowledge of use of DME, Decreased mobility, Decreased range of motion, Decreased strength, Difficulty walking, Increased edema, Hypomobility, Increased muscle spasms, Impaired perceived functional ability, Impaired flexibility, Impaired UE functional use, Impaired sensation, Impaired tone, Postural dysfunction, Improper body mechanics, Pain  Visit Diagnosis: Chronic right-sided low back pain without sciatica - Plan: PT plan of care cert/re-cert  Pain in right hip - Plan: PT plan of care cert/re-cert  Muscle weakness (generalized) - Plan: PT plan of care cert/re-cert  Difficulty in walking, not elsewhere classified - Plan: PT plan of care cert/re-cert      G-Codes - 03-20-2017 1408    Functional Assessment Tool Used (Outpatient Only) modified Oswestry, 5 times sit<>Stand, walk, clinical judgement;    Functional Limitation Mobility: Walking and moving around   Mobility: Walking and Moving Around Current Status [phone]) At least 40 percent but less than 60 percent impaired, limited or restricted   Mobility: Walking and Moving Around Goal  Status [phone]) At least 20 percent but less than 40 percent impaired, limited or restricted       Problem List Patient Active Problem List   Diagnosis Date Noted  . Back pain 02/07/2017  . Aortic atherosclerosis (HCC) 10/14/2015  . Arthralgia 08/11/2015  . Osteoarthritis 07/16/2015  . Rheumatoid arthritis (HCC) 07/16/2015  . Iron deficiency anemia 07/16/2015  . Antiphospholipid syndrome (HCC) 07/15/2015  . Arthritis 07/15/2015  . Carotid bruit 07/15/2015  . 1st degree AV block 07/15/2015  . History of blood clots 07/15/2015  .  History of Graves' disease 07/15/2015  . Hypercholesterolemia 07/15/2015  . Hypertension 07/15/2015  . 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 Joint swelling 07/15/2015   Schawn Byas, SPT This entire session was performed under direct supervision and direction of a licensed therapist/therapist assistant . I have personally read, edited and approve of the note as written.  Trotter,Margaret PT, DPT 02/24/2017, 10:25 AM  [facility] MAIN [facility] SERVICES [address] [place], [place], [address] Phone: [phone]   Fax:  [phone]  Name: Tonya Mcgee MRN: [id number] Date of Birth: [birth year]

## 2017-02-28 ENCOUNTER — Ambulatory Visit: Payer: Medicare Other

## 2017-02-28 DIAGNOSIS — M6281 Muscle weakness (generalized): Secondary | ICD-10-CM

## 2017-02-28 DIAGNOSIS — G8929 Other chronic pain: Secondary | ICD-10-CM

## 2017-02-28 DIAGNOSIS — M545 Low back pain: Principal | ICD-10-CM

## 2017-02-28 DIAGNOSIS — R262 Difficulty in walking, not elsewhere classified: Secondary | ICD-10-CM | POA: Diagnosis not present

## 2017-02-28 DIAGNOSIS — M25551 Pain in right hip: Secondary | ICD-10-CM | POA: Diagnosis not present

## 2017-02-28 NOTE — Therapy (Signed)
[facility] MAIN [facility] SERVICES [address] [place], [place], [address] Phone: [phone]   Fax:  [phone]  Physical Therapy Treatment  Patient Details  Name: Tonya Mcgee MRN: [id number] Date of Birth: 08/31/1940 Referring Provider: Dr. Sherrie Mustache  Encounter Date: 02/28/2017      PT End of Session - 02/28/17 1000    Visit Number 2   Number of Visits 17   Date for PT Re-Evaluation 2017-05-18   Authorization Type G codes 08-20-22   PT Start Time 0945   PT Stop Time 1030   PT Time Calculation (min) 45 min   Equipment Utilized During Treatment Gait belt   Activity Tolerance Patient limited by pain;Patient limited by fatigue  Pt had increased pain following exam due to being in provocative postures   Behavior During Therapy [facility] for tasks assessed/performed      Past Medical History:  Diagnosis Date  . History of chicken pox   . History of measles   . History of mumps     Past Surgical History:  Procedure Laterality Date  . APPENDECTOMY  1960  . Bone Density Study  2011   within normal limits per patient report  . Carotid doppler ultrasound  11/18/2013   mild bilateral plaque formation. No hemodynamically significant stenosis  . CARPAL TUNNEL RELEASE Right 1990  . Colonoscopy Screening  2011   normal per patient report  . KNEE SURGERY Left 1989   arthroscopic  . mammogram screening  2012   normal per patient  . pap smear  2012   normal per patient  . TONSILLECTOMY  1946    There were no vitals filed for this visit.      Subjective Assessment - 02/28/17 0947    Subjective Patient having increased R hip pain on Sunday and was unable to do her HEP. Still painful today, hurts to walk and sit on it.    Pertinent History Pt reports that for several months she has had chronic recurent LBP. In the last week she reports that the back pain has moved to the R hip. She has had multiple episodes of back pain in the past; in 7/17 pt  received PT for LBP with L sided sciatica that resolved with manual therapy and core stability exercise, per pt. Pt ambulates with 3 wheel Rollator, which she began using in the last 5-6 months due to knee pain related to RA. Previously she used a SPC. 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 Pt reports increased pain in extended postures.    Limitations Standing;Walking;House hold activities   How long can you sit comfortably? Unlimited with cushion    How long can you stand comfortably? several minutes; pt sits when performing most household ADLs   How long can you walk comfortably? several hundred feet with progressively flexed posture.    Diagnostic tests X-Ray 5/17 IMPRESSION: Lumbar spine scoliosis and multilevel degenerative change.   Patient Stated Goals To have less pain to tolerate activities in standing and walking for increased participation with family activities.   Currently in Pain? Yes   Pain Score 7    Pain Location Hip   Pain Orientation Right   Pain Descriptors / Indicators Sharp   Pain Type Acute pain   Pain Onset 1 to 4 weeks ago   Pain Frequency Constant   Aggravating Factors  standing, walking, sitting on it   Pain  Relieving Factors advil, sitting on foam pillow, inclined position         SPT Positioned into inclined supine: wedge under head:  Posterior pelvic tilts : painful until placed heating pad: 10x TA contraction 10x 5 second holds TA contraction with UE raises 10x each arm Seated hamstring stretch with stool x 60 seconds  Sidelying IT/gluteal stretch with towel between legs 60 seconds   Manual  PROM hamstring, hip flexor, add, ab, piriformis 60 second holds, performed bilaterally  STM to piriformis   Supine position: performed on incline with bolster under legs                           PT Education - 02/28/17 0957    Education provided Yes   Education Details  utilization of a heating pad with proper body mechanics   Person(s) Educated Patient   Methods Explanation;Demonstration   Comprehension Verbalized understanding;Returned demonstration          PT Short Term Goals - 02/24/17 1018      PT SHORT TERM GOAL #1   Title  Patient will improve lumbar ROM by 6 degrees in all directions in order to improve functional mobility and reduce pain.    Time 4   Period Weeks   Status New   Target Date 03/24/17     PT SHORT TERM GOAL #2   Title Patient will be adherent to HEP 3/7 days in order to improve range of motion/strength for overall better functional mobility.    Time 4   Period Weeks   Status New   Target Date 03/24/17           PT Long Term Goals - 02/23/17 1828      PT LONG TERM GOAL #1   Title Patient will be independent in home exercise program to improve strength/mobility for better functional independence with ADLs.   Time 8   Period Weeks   Status New   Target Date 04/20/17     PT LONG TERM GOAL #2   Title Patient will report a worst pain of 3/10 on VAS in low back and right hip to improve tolerance with ADLs and reduced symptoms with activities.    Time 8   Period Weeks   Status New   Target Date 04/20/17     PT LONG TERM GOAL #3   Title Patient will increase BLE gross strength to 4+/5 as to improve functional strength for independent gait, increased standing tolerance and increased ADL ability   Time 8   Period Weeks   Status New   Target Date 04/20/17     PT LONG TERM GOAL #4   Title Patient (> 28 years old) will complete five times sit to stand test in < 15 seconds indicating an increased LE strength and improved balance.   Baseline 17.5 sec with BUE support; Indicates increased risk for falls    Time 8   Period Weeks   Status New   Target Date 04/20/17     PT LONG TERM GOAL #5   Title Patient will increase 10 meter walk test to >0.55m/s as to improve gait speed for better community ambulation and to  reduce fall risk.   Baseline  0.62 m/s with 3-wheeled Rollator Indicates that pt is a limited community ambulator   Time 8   Period Weeks   Status New   Target Date 04/20/17     Additional Long Term  Goals   Additional Long Term Goals Yes     PT LONG TERM GOAL #6   Title Patient will reduce modified Oswestry score to <20 as to demonstrate minimal disability with ADLs including improved sleeping tolerance, walking/sitting tolerance etc for better mobility with ADLs.    Baseline 52/100: Pt is 52% disabled based on her perceived low back pain.    Time 8   Period Weeks   Status New   Target Date 04/20/17               Plan - 02/28/17 1306    Clinical Impression Statement Patient presents with continued pain in R hip. Pain alleviated when performing abdominal contractions in incline supine position with roll under feet. Pain decreased after prolonged stretch. Patient will continue to benefit form skilled physical therapy to maximize functional mobility and safety.    Rehab Potential Fair   Clinical Impairments Affecting Rehab Potential Multiple comorbidities   PT Frequency 2x / week   PT Duration 8 weeks   PT Treatment/Interventions ADLs/Self Care Home Management;Aquatic Therapy;Biofeedback;Cryotherapy;DME Instruction;Gait training;Stair training;Functional mobility training;Neuromuscular re-education;Balance training;Therapeutic exercise;Therapeutic activities;Patient/family education;Manual techniques;Passive range of motion;Energy conservation;Taping;Moist Heat   PT Next Visit Plan Rule out SI pathology, Pelvic tilts, Ask about contrainications to aquatic therapy   PT Home Exercise Plan Stretching for hip flexors, abductors, piriformis   Consulted and Agree with Plan of Care Patient      Patient will benefit from skilled therapeutic intervention in order to improve the following deficits and impairments:  Abnormal gait, Decreased activity tolerance, Decreased balance, Decreased  endurance, Decreased knowledge of use of DME, Decreased mobility, Decreased range of motion, Decreased strength, Difficulty walking, Increased edema, Hypomobility, Increased muscle spasms, Impaired perceived functional ability, Impaired flexibility, Impaired UE functional use, Impaired sensation, Impaired tone, Postural dysfunction, Improper body mechanics, Pain  Visit Diagnosis: Chronic right-sided low back pain without sciatica  Pain in right hip  Muscle weakness (generalized)  Difficulty in walking, not elsewhere classified     Problem List Patient Active Problem List   Diagnosis Date Noted  . Back pain 02/07/2017  . Aortic atherosclerosis (HCC) 10/14/2015  . Arthralgia 08/11/2015  . Osteoarthritis 07/16/2015  . Rheumatoid arthritis (HCC) 07/16/2015  . Iron deficiency anemia 07/16/2015  . Antiphospholipid syndrome (HCC) 07/15/2015  . Arthritis 07/15/2015  . Carotid bruit 07/15/2015  . 1st degree AV block 07/15/2015  . History of blood clots 07/15/2015  . History of Graves' disease 07/15/2015  . Hypercholesterolemia 07/15/2015  . Hypertension 07/15/2015  . 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 Joint swelling 07/15/2015   Precious Bard, PT, DPT   Precious Bard 02/28/2017, 1:07 PM  [place] MAIN [facility] SERVICES [address] [place], [place], [address] Phone: [phone]   Fax:  [phone]  Name: Ericka Marcellus MRN: [id number] Date of Birth: 01-09-1941

## 2017-03-02 ENCOUNTER — Ambulatory Visit (INDEPENDENT_AMBULATORY_CARE_PROVIDER_SITE_OTHER): Payer: Medicare Other

## 2017-03-02 VITALS — BP 158/72 | HR 72 | Temp 98.4°F | Ht 62.0 in | Wt 201.8 lb

## 2017-03-02 DIAGNOSIS — Z0000 Encounter for general adult medical examination without abnormal findings: Secondary | ICD-10-CM | POA: Diagnosis not present

## 2017-03-02 NOTE — Patient Instructions (Addendum)
[name] , Thank you for taking time to come for your Medicare Wellness Visit. I appreciate your ongoing commitment to your health goals. Please review the following plan we discussed and let me know if I can assist you in the future.   Screening recommendations/referrals: Colonoscopy: up to date, no longer required Mammogram: no longer required Bone Density: up to date Recommended yearly ophthalmology/optometry visit for glaucoma screening and checkup Recommended yearly dental visit for hygiene and checkup  Vaccinations: Influenza vaccine: declined Pneumococcal vaccine: Pneumovax 23 given in 2009, declined Prevnar 13 Tdap vaccine: declined Shingles vaccine: Just received first shingles vaccine, will receive second dose in 06/2017  Advanced directives: Please bring a copy of your POA (Power of [employer]) and/or Living Will to your next appointment.   Conditions/risks identified: Obesity; Recommend to keep dieting to lose more weight and bring BMI down to non Obese.  Next appointment: None, declined setting up an appointment today.   Preventive Care 46 Years and Older, Female Preventive care refers to lifestyle choices and visits with your health care provider that can promote health and wellness. What does preventive care include?  A yearly physical exam. This is also called an annual well check.  Dental exams once or twice a year.  Routine eye exams. Ask your health care provider how often you should have your eyes checked.  Personal lifestyle choices, including:  Daily care of your teeth and gums.  Regular physical activity.  Eating a healthy diet.  Avoiding tobacco and drug use.  Limiting alcohol use.  Practicing safe sex.  Taking low-dose aspirin every day.  Taking vitamin and mineral supplements as recommended by your health care provider. What happens during an annual well check? The services and screenings done by your health care provider during your  annual well check will depend on your age, overall health, lifestyle risk factors, and family history of disease. Counseling  Your health care provider may ask you questions about your:  Alcohol use.  Tobacco use.  Drug use.  Emotional well-being.  Home and relationship well-being.  Sexual activity.  Eating habits.  History of falls.  Memory and ability to understand (cognition).  Work and work Astronomer.  Reproductive health. Screening  You may have the following tests or measurements:  Height, weight, and BMI.  Blood pressure.  Lipid and cholesterol levels. These may be checked every 5 years, or more frequently if you are over 36 years old.  Skin check.  Lung cancer screening. You may have this screening every year starting at age 79 if you have a 30-pack-year history of smoking and currently smoke or have quit within the past 15 years.  Fecal occult blood test (FOBT) of the stool. You may have this test every year starting at age 5.  Flexible sigmoidoscopy or colonoscopy. You may have a sigmoidoscopy every 5 years or a colonoscopy every 10 years starting at age 18.  Hepatitis C blood test.  Hepatitis B blood test.  Sexually transmitted disease (STD) testing.  Diabetes screening. This is done by checking your blood sugar (glucose) after you have not eaten for a while (fasting). You may have this done every 1-3 years.  Bone density scan. This is done to screen for osteoporosis. You may have this done starting at age 30.  Mammogram. This may be done every 1-2 years. Talk to your health care provider about how often you should have regular mammograms. Talk with your health care provider about your test results, treatment options, and  if necessary, the need for more tests. Vaccines  Your health care provider may recommend certain vaccines, such as:  Influenza vaccine. This is recommended every year.  Tetanus, diphtheria, and acellular pertussis (Tdap, Td)  vaccine. You may need a Td booster every 10 years.  Zoster vaccine. You may need this after age 32.  Pneumococcal 13-valent conjugate (PCV13) vaccine. One dose is recommended after age 78.  Pneumococcal polysaccharide (PPSV23) vaccine. One dose is recommended after age 1. Talk to your health care provider about which screenings and vaccines you need and how often you need them. This information is not intended to replace advice given to you by your health care provider. Make sure you discuss any questions you have with your health care provider. Document Released: 06/26/2015 Document Revised: 02/17/2016 Document Reviewed: 03/31/2015 Elsevier Interactive Patient Education  2017 [place] Prevention in the Home Falls can cause injuries. They can happen to people of all ages. There are many things you can do to make your home safe and to help prevent falls. What can I do on the outside of my home?  Regularly fix the edges of walkways and driveways and fix any cracks.  Remove anything that might make you trip as you walk through a door, such as a raised step or threshold.  Trim any bushes or trees on the path to your home.  Use bright outdoor lighting.  Clear any walking paths of anything that might make someone trip, such as rocks or tools.  Regularly check to see if handrails are loose or broken. Make sure that both sides of any steps have handrails.  Any raised decks and porches should have guardrails on the edges.  Have any leaves, snow, or ice cleared regularly.  Use sand or salt on walking paths during winter.  Clean up any spills in your garage right away. This includes oil or grease spills. What can I do in the bathroom?  Use night lights.  Install grab bars by the toilet and in the tub and shower. Do not use towel bars as grab bars.  Use non-skid mats or decals in the tub or shower.  If you need to sit down in the shower, use a plastic, non-slip  stool.  Keep the floor dry. Clean up any water that spills on the floor as soon as it happens.  Remove soap buildup in the tub or shower regularly.  Attach bath mats securely with double-sided non-slip rug tape.  Do not have throw rugs and other things on the floor that can make you trip. What can I do in the bedroom?  Use night lights.  Make sure that you have a light by your bed that is easy to reach.  Do not use any sheets or blankets that are too big for your bed. They should not hang down onto the floor.  Have a firm chair that has side arms. You can use this for support while you get dressed.  Do not have throw rugs and other things on the floor that can make you trip. What can I do in the kitchen?  Clean up any spills right away.  Avoid walking on wet floors.  Keep items that you use a lot in easy-to-reach places.  If you need to reach something above you, use a strong step stool that has a grab bar.  Keep electrical cords out of the way.  Do not use floor polish or wax that makes floors slippery. If you  must use wax, use non-skid floor wax.  Do not have throw rugs and other things on the floor that can make you trip. What can I do with my stairs?  Do not leave any items on the stairs.  Make sure that there are handrails on both sides of the stairs and use them. Fix handrails that are broken or loose. Make sure that handrails are as long as the stairways.  Check any carpeting to make sure that it is firmly attached to the stairs. Fix any carpet that is loose or worn.  Avoid having throw rugs at the top or bottom of the stairs. If you do have throw rugs, attach them to the floor with carpet tape.  Make sure that you have a light switch at the top of the stairs and the bottom of the stairs. If you do not have them, ask someone to add them for you. What else can I do to help prevent falls?  Wear shoes that:  Do not have high heels.  Have rubber bottoms.  Are  comfortable and fit you well.  Are closed at the toe. Do not wear sandals.  If you use a stepladder:  Make sure that it is fully opened. Do not climb a closed stepladder.  Make sure that both sides of the stepladder are locked into place.  Ask someone to hold it for you, if possible.  Clearly mark and make sure that you can see:  Any grab bars or handrails.  First and last steps.  Where the edge of each step is.  Use tools that help you move around (mobility aids) if they are needed. These include:  Canes.  Walkers.  Scooters.  Crutches.  Turn on the lights when you go into a dark area. Replace any light bulbs as soon as they burn out.  Set up your furniture so you have a clear path. Avoid moving your furniture around.  If any of your floors are uneven, fix them.  If there are any pets around you, be aware of where they are.  Review your medicines with your doctor. Some medicines can make you feel dizzy. This can increase your chance of falling. Ask your doctor what other things that you can do to help prevent falls. This information is not intended to replace advice given to you by your health care provider. Make sure you discuss any questions you have with your health care provider. Document Released: 03/26/2009 Document Revised: 11/05/2015 Document Reviewed: 07/04/2014 Elsevier Interactive Patient Education  2017 [employer].

## 2017-03-02 NOTE — Progress Notes (Signed)
Subjective:   Tonya Mcgee is a 76 y.o. female who presents for an Initial Medicare Annual Wellness Visit.  Review of Systems    N/A  Cardiac Risk Factors include: advanced age (>18men, >47 women);dyslipidemia;hypertension;obesity (BMI >30kg/m2)     Objective:    Today's Vitals   03/02/17 1109 03/02/17 1130  BP: (!) 166/74 (!) 158/72  Pulse: 72   Temp: 98.4 F (36.9 C)   TempSrc: Oral   Weight: 201 lb 12.8 oz (91.5 kg)   Height: 5\' 2"  (1.575 m)   PainSc: 0-No pain    Body mass index is 36.91 kg/m.   Current Medications (verified) Outpatient Encounter Prescriptions as of 03/02/2017  Medication Sig  . albuterol (PROVENTIL HFA;VENTOLIN HFA) 108 (90 Base) MCG/ACT inhaler Inhale 2 puffs into the lungs every 6 (six) hours as needed for wheezing or shortness of breath.  03/04/2017 aspirin 81 MG tablet Take 1 tablet by mouth daily.  Marland Kitchen atorvastatin (LIPITOR) 10 MG tablet TAKE 1 TABLET DAILY  . Ferrous Sulfate (IRON) 325 (65 Fe) MG TABS Take 1 tablet by mouth daily.  . furosemide (LASIX) 20 MG tablet Take 1 tablet (20 mg total) by mouth daily.  . hydroxychloroquine (PLAQUENIL) 200 MG tablet Take 2 tablets by mouth daily.  Marland Kitchen levothyroxine (SYNTHROID, LEVOTHROID) 100 MCG tablet TAKE ONE TABLET DAILY  . lisinopril-hydrochlorothiazide (PRINZIDE,ZESTORETIC) 10-12.5 MG tablet TAKE 1 TABLET DAILY  . Multiple Vitamins-Minerals (MULTIVITAMIN ADULT PO) Take 1 tablet by mouth daily.  . naproxen (NAPROSYN) 500 MG tablet Take 1 tablet (500 mg total) by mouth 2 (two) times daily as needed.  . Omega 3 1200 MG CAPS Take 1 capsule by mouth daily.  . traMADol (ULTRAM) 50 MG tablet Take 1 tablet (50 mg total) by mouth every 8 (eight) hours as needed.  . cyclobenzaprine (FLEXERIL) 5 MG tablet Take 1 tablet (5 mg total) by mouth at bedtime.  Marland Kitchen tiZANidine (ZANAFLEX) 4 MG tablet Take 4 mg by mouth at bedtime.    No facility-administered encounter medications on file as of 03/02/2017.     Allergies  (verified) Iodine; Sulfa antibiotics; Amoxicillin; Ciprofloxacin; Codeine; Keflex  [cephalexin]; Penicillins; Pravastatin; and Tape   History: Past Medical History:  Diagnosis Date  . History of chicken pox   . History of measles   . History of mumps    Past Surgical History:  Procedure Laterality Date  . APPENDECTOMY  1960  . Bone Density Study  2011   within normal limits per patient report  . Carotid doppler ultrasound  11/18/2013   mild bilateral plaque formation. No hemodynamically significant stenosis  . CARPAL TUNNEL RELEASE Right 1990  . Colonoscopy Screening  2011   normal per patient report  . KNEE SURGERY Left 1989   arthroscopic  . mammogram screening  2012   normal per patient  . pap smear  2012   normal per patient  . TONSILLECTOMY  1946   Family History  Problem Relation Age of Onset  . Congestive Heart Failure Mother   . Liver cancer Father   . Depression Sister    Social History   Occupational History  . Retired     2013   Social History Main Topics  . Smoking status: Former Smoker    Packs/day: 0.50    Years: 15.00    Types: Cigarettes    Quit date: 06/13/1978  . Smokeless tobacco: Never Used  . Alcohol use 0.0 oz/week     Comment: occasional use; 0-3 a month  .  Drug use: No  . Sexual activity: Not on file    Tobacco Counseling Counseling given: Not Answered   Activities of Daily Living In your present state of health, do you have any difficulty performing the following activities: 03/02/2017  Hearing? N  Vision? N  Difficulty concentrating or making decisions? N  Walking or climbing stairs? Y  Comment due to R hip pain  Dressing or bathing? N  Doing errands, shopping? N  Preparing Food and eating ? N  Using the Toilet? N  In the past six months, have you accidently leaked urine? N  Do you have problems with loss of bowel control? N  Managing your Medications? N  Managing your Finances? N  Housekeeping or managing your  Housekeeping? N  Some recent data might be hidden    Immunizations and Health Maintenance Immunization History  Administered Date(s) Administered  . Influenza-Unspecified 03/13/2014, 04/13/2015  . Pneumococcal Polysaccharide-23 06/14/2007   Health Maintenance Due  Topic Date Due  . HEMOGLOBIN A1C  1941/02/09  . FOOT EXAM  04/23/1949  . INFLUENZA VACCINE  01/11/2017    Patient Care Team: Malva Limes, MD as PCP - General (Family Medicine) Kandyce Rud., MD (Rheumatology) [place] Crane, MD as Consulting Physician (Ophthalmology)  Indicate any recent Medical Services you may have received from other than [facility] providers in the past year (date may be approximate).     Assessment:   This is a routine wellness examination for Tonya Mcgee.   Hearing/Vision screen Vision Screening Comments: Pt sees Dr Brooke Dare for vision checks yearly.   Dietary issues and exercise activities discussed: Current Exercise Habits: Structured exercise class (PT), Type of exercise: stretching, Time (Minutes): 10, Frequency (Times/Week): 7, Weekly Exercise (Minutes/Week): 70, Intensity: Mild, Exercise limited by: orthopedic condition(s)  Goals    . Weight (lb) < 180 lb (81.6 kg)          Recommend to keep dieting to lose more weight and bring BMI down to non Obese.      Depression Screen PHQ 2/9 Scores 03/02/2017  PHQ - 2 Score 0    Fall Risk Fall Risk  03/02/2017  Falls in the past year? No    Cognitive Function:     6CIT Screen 03/02/2017  What Year? 0 points  What month? 0 points  What time? 0 points  Count back from 20 0 points  Months in reverse 2 points  Repeat phrase 0 points  Total Score 2    Screening Tests Health Maintenance  Topic Date Due  . HEMOGLOBIN A1C  Aug 25, 1940  . FOOT EXAM  04/23/1949  . INFLUENZA VACCINE  01/11/2017  . TETANUS/TDAP  06/13/2026 (Originally 04/23/1958)  . PNA vac Low Risk Adult (2 of 2 - PCV13) 06/13/2026 (Originally 06/13/2008)  .  OPHTHALMOLOGY EXAM  02/21/2018  . DEXA SCAN  Completed      Plan:  I have personally reviewed and addressed the Medicare Annual Wellness questionnaire and have noted the following in the patient's chart:  A. Medical and social history B. Use of alcohol, tobacco or illicit drugs  C. Current medications and supplements D. Functional ability and status E.  Nutritional status F.  Physical activity G. Advance directives H. List of other physicians I.  Hospitalizations, surgeries, and ER visits in previous 12 months J.  Vitals K. Screenings such as hearing and vision if needed, cognitive and depression L. Referrals and appointments - none  In addition, I have reviewed and discussed with patient certain  preventive protocols, quality metrics, and best practice recommendations. A written personalized care plan for preventive services as well as general preventive health recommendations were provided to patient.  See attached scanned questionnaire for additional information.   Signed,  Hyacinth Meeker, LPN Nurse Health Advisor   MD Recommendations: Pt declined Prevnar 13, tetanus and the flu vaccine today. Pt states she will receive the flu vaccine in 03/2018. Pt also states she does not need a foot exam or her Hgb A1c checked because she is not a diabetic.

## 2017-03-06 ENCOUNTER — Ambulatory Visit: Payer: Medicare Other | Admitting: Physical Therapy

## 2017-03-08 ENCOUNTER — Ambulatory Visit: Payer: Medicare Other | Admitting: Physical Therapy

## 2017-03-08 DIAGNOSIS — G5602 Carpal tunnel syndrome, left upper limb: Secondary | ICD-10-CM | POA: Diagnosis not present

## 2017-03-09 DIAGNOSIS — R760 Raised antibody titer: Secondary | ICD-10-CM | POA: Diagnosis not present

## 2017-03-09 DIAGNOSIS — M25532 Pain in left wrist: Secondary | ICD-10-CM | POA: Diagnosis not present

## 2017-03-09 DIAGNOSIS — G5602 Carpal tunnel syndrome, left upper limb: Secondary | ICD-10-CM | POA: Diagnosis not present

## 2017-03-13 ENCOUNTER — Ambulatory Visit: Payer: Medicare Other | Admitting: Physical Therapy

## 2017-03-15 ENCOUNTER — Ambulatory Visit: Payer: Medicare Other | Admitting: Physical Therapy

## 2017-03-20 ENCOUNTER — Ambulatory Visit: Payer: Medicare Other | Admitting: Physical Therapy

## 2017-03-22 ENCOUNTER — Ambulatory Visit: Payer: Medicare Other | Admitting: Physical Therapy

## 2017-03-22 DIAGNOSIS — G5602 Carpal tunnel syndrome, left upper limb: Secondary | ICD-10-CM | POA: Diagnosis not present

## 2017-03-27 ENCOUNTER — Ambulatory Visit: Payer: Medicare Other | Admitting: Physical Therapy

## 2017-03-29 ENCOUNTER — Encounter: Payer: Self-pay | Admitting: Physical Therapy

## 2017-03-29 ENCOUNTER — Ambulatory Visit: Payer: Medicare Other | Attending: Family Medicine | Admitting: Physical Therapy

## 2017-03-29 DIAGNOSIS — R262 Difficulty in walking, not elsewhere classified: Secondary | ICD-10-CM | POA: Diagnosis not present

## 2017-03-29 DIAGNOSIS — M25551 Pain in right hip: Secondary | ICD-10-CM | POA: Insufficient documentation

## 2017-03-29 DIAGNOSIS — M545 Low back pain, unspecified: Secondary | ICD-10-CM

## 2017-03-29 DIAGNOSIS — G8929 Other chronic pain: Secondary | ICD-10-CM | POA: Diagnosis not present

## 2017-03-29 DIAGNOSIS — M6281 Muscle weakness (generalized): Secondary | ICD-10-CM | POA: Insufficient documentation
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
## 2017-03-29 NOTE — Patient Instructions (Addendum)
Piriformis Stretch - Supine    With both knees bent, cross right foot over left knee, bring right knee towards left shoulder until stretch is felt in side/back of right hip.  Hold slight stretch for _15__ seconds. Repeat with involved leg. Repeat __2-3_ times. Do _2__ times per day.  Copyright  VHI. All rights reserved.  Piriformis Stretch    Lying on back,with left knee straight, right knee bent, bring right knee over to side towards left leg.  Hold __15__ seconds. Repeat ___2-3_ times. Do __2__ sessions per day.  [email or web address].exer.us/258   Copyright  VHI. All rights reserved.

## 2017-04-03 ENCOUNTER — Ambulatory Visit: Payer: Medicare Other | Admitting: Physical Therapy

## 2017-04-04 ENCOUNTER — Ambulatory Visit: Payer: Medicare Other | Admitting: Gastroenterology

## 2017-04-05 ENCOUNTER — Ambulatory Visit: Payer: Medicare Other | Admitting: Physical Therapy

## 2017-04-05 DIAGNOSIS — R262 Difficulty in walking, not elsewhere classified: Secondary | ICD-10-CM | POA: Diagnosis not present

## 2017-04-05 DIAGNOSIS — M6281 Muscle weakness (generalized): Secondary | ICD-10-CM | POA: Diagnosis not present

## 2017-04-05 DIAGNOSIS — M25551 Pain in right hip: Secondary | ICD-10-CM

## 2017-04-05 DIAGNOSIS — M545 Low back pain: Principal | ICD-10-CM

## 2017-04-05 DIAGNOSIS — G8929 Other chronic pain: Secondary | ICD-10-CM

## 2017-04-05 NOTE — Therapy (Signed)
[place] MAIN [facility] SERVICES [address] [place], [place], [address] Phone: [phone]   Fax:  [phone]  Physical Therapy Treatment  Patient Details  Name: Tonya Mcgee MRN: [id number] Date of Birth: Feb 06, 1941 Referring Provider: Dr. Caryn Section  Encounter Date: 04/05/2017      PT End of Session - 04/05/17 0943    Visit Number 4   Number of Visits 17   Date for PT Re-Evaluation 04-May-2017   Authorization Type G codes 4/10   PT Start Time 0930   PT Stop Time 1015   PT Time Calculation (min) 45 min   Activity Tolerance Patient limited by pain;Patient limited by fatigue  Pt had increased pain following exam due to being in provocative postures   Behavior During Therapy [facility] for tasks assessed/performed      Past Medical History:  Diagnosis Date  . History of chicken pox   . History of measles   . History of mumps     Past Surgical History:  Procedure Laterality Date  . APPENDECTOMY  1960  . Bone Density Study  2011   within normal limits per patient report  . Carotid doppler ultrasound  11/18/2013   mild bilateral plaque formation. No hemodynamically significant stenosis  . CARPAL TUNNEL RELEASE Right 1990  . Colonoscopy Screening  2011   normal per patient report  . KNEE SURGERY Left 1989   arthroscopic  . mammogram screening  2012   normal per patient  . pap smear  2012   normal per patient  . TONSILLECTOMY  1946    There were no vitals filed for this visit.      Subjective Assessment - 04/05/17 0937    Subjective Patient went out of town over the weekend and reports having a good trip; She reports not being able to do the stretch for her IT band due to knee discomfort. She reports having no pain in sitting but will have discomfort with initial standing;    Pertinent History Pt reports that for several months she has had chronic recurent LBP. In the last week she reports that the back pain has moved to the R hip. She  has had multiple episodes of back pain in the past; in 7/17 pt received PT for LBP with L sided sciatica that resolved with manual therapy and core stability exercise, per pt. Pt ambulates with 3 wheel Rollator, which she began using in the last 5-6 months due to knee pain related to RA. Previously she used a SPC. 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 Pt reports increased pain in extended postures.    Limitations Standing;Walking;House hold activities   How long can you sit comfortably? Unlimited with cushion    How long can you stand comfortably? several minutes; pt sits when performing most household ADLs   How long can you walk comfortably? several hundred feet with progressively flexed posture.    Diagnostic tests X-Ray 5/17 IMPRESSION: Lumbar spine scoliosis and multilevel degenerative change.   Patient Stated Goals To have less pain to tolerate activities in standing and walking for increased participation with family activities.   Currently in Pain? Yes   Pain Score 4    Pain Location Hip   Pain Orientation Right;Lateral   Pain Descriptors / Indicators Sharp   Pain Type Chronic pain   Pain Onset 1 to 4 weeks ago   Pain Frequency Intermittent  Aggravating Factors  worse with initial standing; worse in the morning;    Pain Relieving Factors sleeping is improved; pain meds; heating pad helps some;    Effect of Pain on Daily Activities decreased activity tolerance;    Multiple Pain Sites No   Pain Onset Yesterday               TREATMENT:  Patient positioning in sidelying with moist heat to right hip x5 min (unbilled);   Manual therapy: PT performed soft tissue massage using rolling stick along right lateral hip/IT band x 20mn; Patient tolerated well without pain and reports "it feels better" after soft tissue work;  Exercise: Hooklying: Single knee to chest 20 sec hold x3 bilaterally; Hamstring neural  stretch 5 sec hold x5 reps; Patient required cues to avoid painful ROM and to relax during stretch for better tissue mobility;   Pball hooklying: Lumbar trunk rotation x10 bilaterally with min A to increase ROM for better stretch; Mini bridge with BLE x10 reps; Required min A to place LEs on pball and cues to improve positioning and core stabilization for better back control;   Hooklying with red tband around BLE: Hip abduction/ER 2x10 with cues to avoid painful ROM; Hip flexion march 2x10 bilaterally with cues to increase abdominal contraction for better core stabilization;   Patient reports less pain at end of session;                     PT Education - 04/05/17 0939    Education provided Yes   Education Details HEP reinforced, manual therapy, exercise;    Person(s) Educated Patient   Methods Explanation;Demonstration;Verbal cues   Comprehension Verbalized understanding;Returned demonstration;Verbal cues required;Need further instruction          PT Short Term Goals - 03/29/17 1058      PT SHORT TERM GOAL #1   Title  Patient will improve lumbar ROM by 6 degrees in all directions in order to improve functional mobility and reduce pain.    Time 4   Period Weeks   Status Partially Met   Target Date 03/24/17     PT SHORT TERM GOAL #2   Title Patient will be adherent to HEP 3/7 days in order to improve range of motion/strength for overall better functional mobility.    Baseline hasn't been as adherent while on vaction but since being home she has been more compliant;    Time 4   Period Weeks   Status Not Met   Target Date 03/24/17           PT Long Term Goals - 03/29/17 1059      PT LONG TERM GOAL #1   Title Patient will be independent in home exercise program to improve strength/mobility for better functional independence with ADLs.   Time 8   Period Weeks   Status On-going   Target Date 04/20/17     PT LONG TERM GOAL #2   Title Patient will  report a worst pain of 3/10 on VAS in low back and right hip to improve tolerance with ADLs and reduced symptoms with activities.    Baseline 6-7/10 in last 48 hours;    Time 8   Period Weeks   Status Not Met   Target Date 04/20/17     PT LONG TERM GOAL #3   Title Patient will increase BLE gross strength to 4+/5 as to improve functional strength for independent gait, increased standing tolerance and increased  ADL ability   Time 8   Period Weeks   Status Not Met   Target Date 04/20/17     PT LONG TERM GOAL #4   Title Patient (> 30 years old) will complete five times sit to stand test in < 15 seconds indicating an increased LE strength and improved balance.   Baseline 17.5 sec with BUE support; Indicates increased risk for falls    Time 8   Period Weeks   Status Not Met   Target Date 04/20/17     PT LONG TERM GOAL #5   Title Patient will increase 10 meter walk test to >0.60ms as to improve gait speed for better community ambulation and to reduce fall risk.   Baseline  0.62 m/s with 3-wheeled Rollator Indicates that pt is a limited community ambulator   Time 8   Period Weeks   Status Not Met   Target Date 04/20/17     PT LONG TERM GOAL #6   Title Patient will reduce modified Oswestry score to <20 as to demonstrate minimal disability with ADLs including improved sleeping tolerance, walking/sitting tolerance etc for better mobility with ADLs.    Baseline 52/100: Pt is 52% disabled based on her perceived low back pain.    Time 8   Period Weeks   Status Not Met   Target Date 04/20/17               Plan - 04/05/17 1008    Clinical Impression Statement Patient has increased discomfort in right lateral hip that has gotten better over the last few days. She reports that she hasn't been able to do the stretches because of knee discomfort. PT performed extensive soft tissue massage to right IT band. Pt tolerated well; She reports less tenderness following manual therapy; PT  instructed patient in advanced LE strengthening exercise with cues to increase core stabilization and better ROM for improved strength and core stability. she tolerated well without increase in back pain; Patient would benefit from additional skilled PT intervention to improve tissue extensibility and reduce back/hip pain;    Rehab Potential Fair   Clinical Impairments Affecting Rehab Potential Multiple comorbidities   PT Frequency 2x / week   PT Duration 8 weeks   PT Treatment/Interventions ADLs/Self Care Home Management;Aquatic Therapy;Biofeedback;Cryotherapy;DME Instruction;Gait training;Stair training;Functional mobility training;Neuromuscular re-education;Balance training;Therapeutic exercise;Therapeutic activities;Patient/family education;Manual techniques;Passive range of motion;Energy conservation;Taping;Moist Heat   PT Next Visit Plan Rule out SI pathology, Pelvic tilts, Ask about contrainications to aquatic therapy   PT Home Exercise Plan Stretching for hip flexors, abductors, piriformis   Consulted and Agree with Plan of Care Patient      Patient will benefit from skilled therapeutic intervention in order to improve the following deficits and impairments:  Abnormal gait, Decreased activity tolerance, Decreased balance, Decreased endurance, Decreased knowledge of use of DME, Decreased mobility, Decreased range of motion, Decreased strength, Difficulty walking, Increased edema, Hypomobility, Increased muscle spasms, Impaired perceived functional ability, Impaired flexibility, Impaired UE functional use, Impaired sensation, Impaired tone, Postural dysfunction, Improper body mechanics, Pain  Visit Diagnosis: Chronic right-sided low back pain without sciatica  Pain in right hip  Muscle weakness (generalized)  Difficulty in walking, not elsewhere classified     Problem List Patient Active Problem List   Diagnosis Date Noted  . Back pain 02/07/2017  . [place] ([place]  10/14/2015  . Arthralgia 08/11/2015  . Osteoarthritis 07/16/2015  . [place]oid [place] ([place] 07/16/2015  . Iron deficiency anemia 07/16/2015  . Antiphospholipid syndrome (  [place]) 07/15/2015  . Arthritis 07/15/2015  . Carotid bruit 07/15/2015  . 1st degree AV block 07/15/2015  . History of blood clots 07/15/2015  . History of Graves' disease 07/15/2015  . Hypercholesterolemia 07/15/2015  . Hypertension 07/15/2015  . 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 Joint swelling 07/15/2015    Rabiah Goeser 04/05/2017, 10:26 AM  [place] MAIN [facility] SERVICES [address] [place], [place], [address] Phone: [phone]   Fax:  [phone]  Name: Salem Mastrogiovanni MRN: [id number] Date of Birth: 12-29-1940

## 2017-04-10 ENCOUNTER — Encounter: Payer: Medicare Other | Admitting: Physical Therapy

## 2017-04-12 ENCOUNTER — Encounter: Payer: Self-pay | Admitting: Physical Therapy

## 2017-04-12 ENCOUNTER — Ambulatory Visit: Payer: Medicare Other | Admitting: Physical Therapy

## 2017-04-12 DIAGNOSIS — G8929 Other chronic pain: Secondary | ICD-10-CM | POA: Diagnosis not present

## 2017-04-12 DIAGNOSIS — M6281 Muscle weakness (generalized): Secondary | ICD-10-CM

## 2017-04-12 DIAGNOSIS — R262 Difficulty in walking, not elsewhere classified: Secondary | ICD-10-CM

## 2017-04-12 DIAGNOSIS — M25551 Pain in right hip: Secondary | ICD-10-CM

## 2017-04-12 DIAGNOSIS — M545 Low back pain: Principal | ICD-10-CM

## 2017-04-12 NOTE — Therapy (Signed)
[place] MAIN [facility] SERVICES [address] [place], [place], [address] Phone: [phone]   Fax:  [phone]  Physical Therapy Treatment  Patient Details  Name: Tonya Mcgee MRN: [id number] Date of Birth: [birth year] Referring Provider: Dr. Caryn Section  Encounter Date: 04/12/2017      PT End of Session - 04/12/17 0936    Visit Number 5   Number of Visits 17   Date for PT Re-Evaluation 05/14/17   Authorization Type G codes 5   Authorization Time Period 10   PT Start Time 0930   PT Stop Time 1015   PT Time Calculation (min) 45 min   Activity Tolerance Patient limited by fatigue;No increased pain  Pt had increased pain following exam due to being in provocative postures   Behavior During Therapy [facility] for tasks assessed/performed      Past Medical History:  Diagnosis Date  . History of chicken pox   . History of measles   . History of mumps     Past Surgical History:  Procedure Laterality Date  . APPENDECTOMY  1960  . Bone Density Study  2011   within normal limits per patient report  . Carotid doppler ultrasound  11/18/2013   mild bilateral plaque formation. No hemodynamically significant stenosis  . CARPAL TUNNEL RELEASE Right 1990  . Colonoscopy Screening  2011   normal per patient report  . KNEE SURGERY Left 1989   arthroscopic  . mammogram screening  2012   normal per patient  . pap smear  2012   normal per patient  . TONSILLECTOMY  1946    There were no vitals filed for this visit.      Subjective Assessment - 04/12/17 0934    Subjective Patient reports increased soreness in right hip today. She states, "I am fine by 1PM but early in the morning it starts to bother me." Patient reports still having difficulty with some of her HEP due to knee discomfort;    Pertinent History Pt reports that for several months she has had chronic recurent LBP. In the last week she reports that the back pain has moved to the R hip. She  has had multiple episodes of back pain in the past; in 7/17 pt received PT for LBP with L sided sciatica that resolved with manual therapy and core stability exercise, per pt. Pt ambulates with 3 wheel Rollator, which she began using in the last 5-6 months due to knee pain related to RA. Previously she used a SPC. 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 Pt reports increased pain in extended postures.    Limitations Standing;Walking;House hold activities   How long can you sit comfortably? Unlimited with cushion    How long can you stand comfortably? several minutes; pt sits when performing most household ADLs   How long can you walk comfortably? several hundred feet with progressively flexed posture.    Diagnostic tests X-Ray 5/17 IMPRESSION: Lumbar spine scoliosis and multilevel degenerative change.   Patient Stated Goals To have less pain to tolerate activities in standing and walking for increased participation with family activities.   Currently in Pain? Yes   Pain Score 5    Pain Location Hip   Pain Orientation Right;Posterior   Pain Descriptors / Indicators Aching;Sore   Pain Type Chronic pain   Pain Radiating Towards stays in right hip;    Pain Onset 1 to  4 weeks ago   Pain Frequency Intermittent   Aggravating Factors  worse in the morning;    Pain Relieving Factors better with exercise/pain meds   Effect of Pain on Daily Activities decreased activity tolerance in the morning;    Multiple Pain Sites No   Pain Onset Yesterday          TREATMENT: Warm up on Nustep BUE/BLE level 2 x4 min concurrent with moist heat to right hip (unbilled);   Manual therapy: PT performed soft tissue massage using rolling stick along right lateral hip/IT band x 8mn; Patient tolerated well without pain and reports "it feels better" after soft tissue work;  Exercise: Hooklying: Single knee to chest 20 sec hold x2 bilaterally with cues  to hold under knee for less knee discomfort; Patient required cues to avoid painful ROM and to relax during stretch for better tissue mobility;   Pball hooklying: Lumbar trunk rotation x10 bilaterally with min A to increase ROM for better stretch; Posterior pelvic rock with diaphragmatic breathing x10 reps with max VCs for positioning and to isolate lower abdominal activation; Hooklying with pball on legs, hands on ball, pushing down into ball with core abdominal contraction x10 reps;  Required min A to place LEs on pball and cues to improve positioning and core stabilization for better back control;   Hooklying with red tband around BLE: Hip abduction/ER x10 with cues to avoid painful ROM; Hip flexion march 2x10 bilaterally with cues to increase abdominal contraction for better core stabilization;   Instructed patient in log roll technique for less back discomfort; Patient able to demonstrate understanding without difficulty;  Patient reports no pain during exercise; However, upon standing, she reports immediate return of right posterior hip pain which is worse with standing in erect posture. Patient stands with approximately 30 degrees lumbar flexion. She reports less pain with additional flexion but worse pain when trying to get to erect posture; patient ambulated 50 feet with RW, short shuffled steps with antalgic gait pattern; Patient educated to consider pool exercise as the pool would be more comfortable and allow improved flexibility with less discomfort to hip/knees; Patient will consider;                         PT Education - 04/12/17 0936    Education provided Yes   Education Details HEP reinforced, exercise; postural control;    Person(s) Educated Patient   Methods Explanation;Demonstration;Verbal cues   Comprehension Verbalized understanding;Returned demonstration;Verbal cues required;Need further instruction          PT Short Term Goals - 03/29/17 1058       PT SHORT TERM GOAL #1   Title  Patient will improve lumbar ROM by 6 degrees in all directions in order to improve functional mobility and reduce pain.    Time 4   Period Weeks   Status Partially Met   Target Date 03/24/17     PT SHORT TERM GOAL #2   Title Patient will be adherent to HEP 3/7 days in order to improve range of motion/strength for overall better functional mobility.    Baseline hasn't been as adherent while on vaction but since being home she has been more compliant;    Time 4   Period Weeks   Status Not Met   Target Date 03/24/17           PT Long Term Goals - 03/29/17 1059      PT LONG TERM  GOAL #1   Title Patient will be independent in home exercise program to improve strength/mobility for better functional independence with ADLs.   Time 8   Period Weeks   Status On-going   Target Date 04/20/17     PT LONG TERM GOAL #2   Title Patient will report a worst pain of 3/10 on VAS in low back and right hip to improve tolerance with ADLs and reduced symptoms with activities.    Baseline 6-7/10 in last 48 hours;    Time 8   Period Weeks   Status Not Met   Target Date 04/20/17     PT LONG TERM GOAL #3   Title Patient will increase BLE gross strength to 4+/5 as to improve functional strength for independent gait, increased standing tolerance and increased ADL ability   Time 8   Period Weeks   Status Not Met   Target Date 04/20/17     PT LONG TERM GOAL #4   Title Patient (> 78 years old) will complete five times sit to stand test in < 15 seconds indicating an increased LE strength and improved balance.   Baseline 17.5 sec with BUE support; Indicates increased risk for falls    Time 8   Period Weeks   Status Not Met   Target Date 04/20/17     PT LONG TERM GOAL #5   Title Patient will increase 10 meter walk test to >0.60ms as to improve gait speed for better community ambulation and to reduce fall risk.   Baseline  0.62 m/s with 3-wheeled Rollator  Indicates that pt is a limited community ambulator   Time 8   Period Weeks   Status Not Met   Target Date 04/20/17     PT LONG TERM GOAL #6   Title Patient will reduce modified Oswestry score to <20 as to demonstrate minimal disability with ADLs including improved sleeping tolerance, walking/sitting tolerance etc for better mobility with ADLs.    Baseline 52/100: Pt is 52% disabled based on her perceived low back pain.    Time 8   Period Weeks   Status Not Met   Target Date 04/20/17               Plan - 04/12/17 1050    Clinical Impression Statement Instructed patient in advanced core stabilization/LE strengthening exercise. Reinforced HEP with instruction on ways to better stretch hip with less knee discomfort. Patient did have difficulty isolating core exercises with posterior pelvic tilt. Patient denies any discomfort during exercise, however upon standing she reports immediate onset of right posterior leg pain. concerned that her pain is coming from nerve impingment in back as she reports no pain when leaning forward but severe pain in standing erect being unable to achieve neutral position. Patient educated to start thinking about aquatic exercise as this might be more tolerable for patient and would give her a better benefit for mobility. Patient would benefit from additional skilled PT intervention to improve strength, lumbar ROM and reduce pain with ADLs.    Rehab Potential Fair   Clinical Impairments Affecting Rehab Potential Multiple comorbidities   PT Frequency 2x / week   PT Duration 8 weeks   PT Treatment/Interventions ADLs/Self Care Home Management;Aquatic Therapy;Biofeedback;Cryotherapy;DME Instruction;Gait training;Stair training;Functional mobility training;Neuromuscular re-education;Balance training;Therapeutic exercise;Therapeutic activities;Patient/family education;Manual techniques;Passive range of motion;Energy conservation;Taping;Moist Heat   PT Next Visit Plan  Pelvic tilts, lumbar flexion preference;    PT Home Exercise Plan continue as given;    Consulted  and Agree with Plan of Care Patient      Patient will benefit from skilled therapeutic intervention in order to improve the following deficits and impairments:  Abnormal gait, Decreased activity tolerance, Decreased balance, Decreased endurance, Decreased knowledge of use of DME, Decreased mobility, Decreased range of motion, Decreased strength, Difficulty walking, Increased edema, Hypomobility, Increased muscle spasms, Impaired perceived functional ability, Impaired flexibility, Impaired UE functional use, Impaired sensation, Impaired tone, Postural dysfunction, Improper body mechanics, Pain  Visit Diagnosis: Chronic right-sided low back pain without sciatica  Pain in right hip  Muscle weakness (generalized)  Difficulty in walking, not elsewhere classified     Problem List Patient Active Problem List   Diagnosis Date Noted  . Back pain 02/07/2017  . Aortic atherosclerosis ([place]) 10/14/2015  . Arthralgia 08/11/2015  . Osteoarthritis 07/16/2015  . Rheumatoid arthritis ([place]) 07/16/2015  . Iron deficiency anemia 07/16/2015  . Antiphospholipid syndrome ([place]) 07/15/2015  . Arthritis 07/15/2015  . Carotid bruit 07/15/2015  . 1st degree AV block 07/15/2015  . History of blood clots 07/15/2015  . History of Graves' disease 07/15/2015  . Hypercholesterolemia 07/15/2015  . Hypertension 07/15/2015  . 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 Joint swelling 07/15/2015    Zuzanna Maroney PT, DPT 04/12/2017, 11:01 AM  [place] [facility] MAIN [facility] SERVICES [address] [place], [place], [address] Phone: [phone]   Fax:  [phone]  Name: Kamika Goodloe MRN: [id number] Date of Birth: July 19, 1940

## 2017-04-17 ENCOUNTER — Ambulatory Visit: Payer: Medicare Other | Admitting: Physical Therapy

## 2017-04-19 ENCOUNTER — Ambulatory Visit: Payer: Medicare Other | Admitting: Physical Therapy

## 2017-04-24 ENCOUNTER — Encounter: Payer: Medicare Other | Admitting: Physical Therapy

## 2017-04-26 ENCOUNTER — Ambulatory Visit: Payer: Medicare Other | Admitting: Physical Therapy

## 2017-07-06 DIAGNOSIS — M150 Primary generalized (osteo)arthritis: Secondary | ICD-10-CM | POA: Diagnosis not present

## 2017-07-06 DIAGNOSIS — R760 Raised antibody titer: Secondary | ICD-10-CM | POA: Diagnosis not present

## 2017-07-06 DIAGNOSIS — M0579 Rheumatoid arthritis with rheumatoid factor of multiple sites without organ or systems involvement: Secondary | ICD-10-CM | POA: Diagnosis not present

## 2017-07-06 DIAGNOSIS — M545 Low back pain: Secondary | ICD-10-CM | POA: Diagnosis not present

## 2017-07-08 ENCOUNTER — Other Ambulatory Visit: Payer: Self-pay | Admitting: Family Medicine

## 2017-07-31 ENCOUNTER — Other Ambulatory Visit: Payer: Self-pay | Admitting: Rheumatology

## 2017-07-31 DIAGNOSIS — M545 Low back pain: Principal | ICD-10-CM

## 2017-07-31 DIAGNOSIS — G8929 Other chronic pain: Secondary | ICD-10-CM

## 2017-08-07 ENCOUNTER — Ambulatory Visit
Admission: RE | Admit: 2017-08-07 | Discharge: 2017-08-07 | Disposition: A | Discharge status: Home / Self Care | Payer: Medicare Other | Source: Ambulatory Visit | Attending: Rheumatology | Admitting: Rheumatology

## 2017-08-07 DIAGNOSIS — G8929 Other chronic pain: Secondary | ICD-10-CM | POA: Diagnosis present

## 2017-08-07 DIAGNOSIS — M7138 Other bursal cyst, other site: Secondary | ICD-10-CM | POA: Insufficient documentation

## 2017-08-07 DIAGNOSIS — M545 Low back pain: Secondary | ICD-10-CM

## 2017-08-07 DIAGNOSIS — M48061 Spinal stenosis, lumbar region without neurogenic claudication: Secondary | ICD-10-CM | POA: Insufficient documentation

## 2017-08-07 DIAGNOSIS — M50221 Other cervical disc displacement at C4-C5 level: Secondary | ICD-10-CM | POA: Diagnosis not present

## 2017-08-09 DIAGNOSIS — M5416 Radiculopathy, lumbar region: Secondary | ICD-10-CM | POA: Diagnosis not present

## 2017-08-09 DIAGNOSIS — M48062 Spinal stenosis, lumbar region with neurogenic claudication: Secondary | ICD-10-CM | POA: Diagnosis not present

## 2017-08-09 DIAGNOSIS — M5136 Other intervertebral disc degeneration, lumbar region: Secondary | ICD-10-CM | POA: Diagnosis not present

## 2017-08-14 ENCOUNTER — Other Ambulatory Visit: Payer: Self-pay | Admitting: Family Medicine

## 2017-08-20 ENCOUNTER — Other Ambulatory Visit: Payer: Self-pay | Admitting: Family Medicine

## 2017-09-14 DIAGNOSIS — M5416 Radiculopathy, lumbar region: Secondary | ICD-10-CM | POA: Diagnosis not present

## 2017-09-14 DIAGNOSIS — M5136 Other intervertebral disc degeneration, lumbar region: Secondary | ICD-10-CM | POA: Diagnosis not present

## 2017-09-14 DIAGNOSIS — M48062 Spinal stenosis, lumbar region with neurogenic claudication: Secondary | ICD-10-CM | POA: Diagnosis not present

## 2017-09-19 DIAGNOSIS — G8929 Other chronic pain: Secondary | ICD-10-CM | POA: Diagnosis not present

## 2017-09-19 DIAGNOSIS — M0579 Rheumatoid arthritis with rheumatoid factor of multiple sites without organ or systems involvement: Secondary | ICD-10-CM | POA: Diagnosis not present

## 2017-09-19 DIAGNOSIS — M25511 Pain in right shoulder: Secondary | ICD-10-CM | POA: Diagnosis not present

## 2017-09-19 DIAGNOSIS — M545 Low back pain: Secondary | ICD-10-CM | POA: Diagnosis not present

## 2017-09-19 DIAGNOSIS — M25561 Pain in right knee: Secondary | ICD-10-CM | POA: Diagnosis not present

## 2017-09-19 DIAGNOSIS — M150 Primary generalized (osteo)arthritis: Secondary | ICD-10-CM | POA: Diagnosis not present

## 2017-09-19 DIAGNOSIS — M25562 Pain in left knee: Secondary | ICD-10-CM | POA: Diagnosis not present

## 2017-10-04 ENCOUNTER — Emergency Department (HOSPITAL_COMMUNITY): Payer: Medicare Other

## 2017-10-04 ENCOUNTER — Inpatient Hospital Stay (HOSPITAL_COMMUNITY)
Admission: EM | Admit: 2017-10-04 | Discharge: 2017-10-16 | DRG: 871 | Disposition: A | Discharge status: Other Acute Inpatient Hospital | Payer: Medicare Other | Attending: Internal Medicine | Admitting: Internal Medicine

## 2017-10-04 ENCOUNTER — Inpatient Hospital Stay (HOSPITAL_COMMUNITY): Payer: Medicare Other

## 2017-10-04 ENCOUNTER — Encounter (HOSPITAL_COMMUNITY): Payer: Self-pay | Admitting: Emergency Medicine

## 2017-10-04 DIAGNOSIS — E039 Hypothyroidism, unspecified: Secondary | ICD-10-CM | POA: Diagnosis present

## 2017-10-04 DIAGNOSIS — I4891 Unspecified atrial fibrillation: Secondary | ICD-10-CM

## 2017-10-04 DIAGNOSIS — J810 Acute pulmonary edema: Secondary | ICD-10-CM | POA: Diagnosis not present

## 2017-10-04 DIAGNOSIS — K921 Melena: Secondary | ICD-10-CM | POA: Diagnosis not present

## 2017-10-04 DIAGNOSIS — J849 Interstitial pulmonary disease, unspecified: Secondary | ICD-10-CM | POA: Diagnosis not present

## 2017-10-04 DIAGNOSIS — I248 Other forms of acute ischemic heart disease: Secondary | ICD-10-CM | POA: Diagnosis present

## 2017-10-04 DIAGNOSIS — I509 Heart failure, unspecified: Secondary | ICD-10-CM

## 2017-10-04 DIAGNOSIS — Y92230 Patient room in hospital as the place of occurrence of the external cause: Secondary | ICD-10-CM | POA: Diagnosis not present

## 2017-10-04 DIAGNOSIS — I480 Paroxysmal atrial fibrillation: Secondary | ICD-10-CM | POA: Diagnosis not present

## 2017-10-04 DIAGNOSIS — K146 Glossodynia: Secondary | ICD-10-CM | POA: Diagnosis not present

## 2017-10-04 DIAGNOSIS — Z880 Allergy status to penicillin: Secondary | ICD-10-CM

## 2017-10-04 DIAGNOSIS — K922 Gastrointestinal hemorrhage, unspecified: Secondary | ICD-10-CM | POA: Diagnosis not present

## 2017-10-04 DIAGNOSIS — A419 Sepsis, unspecified organism: Secondary | ICD-10-CM

## 2017-10-04 DIAGNOSIS — N179 Acute kidney failure, unspecified: Secondary | ICD-10-CM | POA: Diagnosis not present

## 2017-10-04 DIAGNOSIS — I700 Atherosclerosis of aorta: Secondary | ICD-10-CM | POA: Diagnosis present

## 2017-10-04 DIAGNOSIS — R0989 Other specified symptoms and signs involving the circulatory and respiratory systems: Secondary | ICD-10-CM

## 2017-10-04 DIAGNOSIS — Z87891 Personal history of nicotine dependence: Secondary | ICD-10-CM

## 2017-10-04 DIAGNOSIS — E669 Obesity, unspecified: Secondary | ICD-10-CM | POA: Diagnosis present

## 2017-10-04 DIAGNOSIS — Z8619 Personal history of other infectious and parasitic diseases: Secondary | ICD-10-CM

## 2017-10-04 DIAGNOSIS — I5030 Unspecified diastolic (congestive) heart failure: Secondary | ICD-10-CM | POA: Diagnosis not present

## 2017-10-04 DIAGNOSIS — R1310 Dysphagia, unspecified: Secondary | ICD-10-CM | POA: Diagnosis present

## 2017-10-04 DIAGNOSIS — D62 Acute posthemorrhagic anemia: Secondary | ICD-10-CM | POA: Diagnosis not present

## 2017-10-04 DIAGNOSIS — K148 Other diseases of tongue: Secondary | ICD-10-CM | POA: Diagnosis not present

## 2017-10-04 DIAGNOSIS — R918 Other nonspecific abnormal finding of lung field: Secondary | ICD-10-CM

## 2017-10-04 DIAGNOSIS — J9811 Atelectasis: Secondary | ICD-10-CM | POA: Diagnosis not present

## 2017-10-04 DIAGNOSIS — Z791 Long term (current) use of non-steroidal anti-inflammatories (NSAID): Secondary | ICD-10-CM

## 2017-10-04 DIAGNOSIS — Z800 Family history of malignant neoplasm of digestive organs: Secondary | ICD-10-CM

## 2017-10-04 DIAGNOSIS — A413 Sepsis due to Hemophilus influenzae: Secondary | ICD-10-CM | POA: Diagnosis not present

## 2017-10-04 DIAGNOSIS — E7800 Pure hypercholesterolemia, unspecified: Secondary | ICD-10-CM | POA: Diagnosis present

## 2017-10-04 DIAGNOSIS — R6521 Severe sepsis with septic shock: Secondary | ICD-10-CM | POA: Diagnosis present

## 2017-10-04 DIAGNOSIS — B029 Zoster without complications: Secondary | ICD-10-CM | POA: Diagnosis present

## 2017-10-04 DIAGNOSIS — J9601 Acute respiratory failure with hypoxia: Secondary | ICD-10-CM

## 2017-10-04 DIAGNOSIS — Z79890 Hormone replacement therapy (postmenopausal): Secondary | ICD-10-CM

## 2017-10-04 DIAGNOSIS — I110 Hypertensive heart disease with heart failure: Secondary | ICD-10-CM | POA: Diagnosis present

## 2017-10-04 DIAGNOSIS — Z91048 Other nonmedicinal substance allergy status: Secondary | ICD-10-CM

## 2017-10-04 DIAGNOSIS — Z6838 Body mass index (BMI) 38.0-38.9, adult: Secondary | ICD-10-CM

## 2017-10-04 DIAGNOSIS — Z7982 Long term (current) use of aspirin: Secondary | ICD-10-CM

## 2017-10-04 DIAGNOSIS — J14 Pneumonia due to Hemophilus influenzae: Secondary | ICD-10-CM | POA: Diagnosis present

## 2017-10-04 DIAGNOSIS — J9621 Acute and chronic respiratory failure with hypoxia: Secondary | ICD-10-CM | POA: Diagnosis present

## 2017-10-04 DIAGNOSIS — N19 Unspecified kidney failure: Secondary | ICD-10-CM | POA: Diagnosis not present

## 2017-10-04 DIAGNOSIS — R0602 Shortness of breath: Secondary | ICD-10-CM

## 2017-10-04 DIAGNOSIS — J189 Pneumonia, unspecified organism: Secondary | ICD-10-CM | POA: Diagnosis present

## 2017-10-04 DIAGNOSIS — Z885 Allergy status to narcotic agent status: Secondary | ICD-10-CM

## 2017-10-04 DIAGNOSIS — Z832 Family history of diseases of the blood and blood-forming organs and certain disorders involving the immune mechanism: Secondary | ICD-10-CM

## 2017-10-04 DIAGNOSIS — Z881 Allergy status to other antibiotic agents status: Secondary | ICD-10-CM

## 2017-10-04 DIAGNOSIS — J1008 Influenza due to other identified influenza virus with other specified pneumonia: Secondary | ICD-10-CM | POA: Diagnosis present

## 2017-10-04 DIAGNOSIS — F329 Major depressive disorder, single episode, unspecified: Secondary | ICD-10-CM | POA: Diagnosis present

## 2017-10-04 DIAGNOSIS — I5033 Acute on chronic diastolic (congestive) heart failure: Secondary | ICD-10-CM | POA: Diagnosis present

## 2017-10-04 DIAGNOSIS — M1990 Unspecified osteoarthritis, unspecified site: Secondary | ICD-10-CM | POA: Diagnosis present

## 2017-10-04 DIAGNOSIS — T39395A Adverse effect of other nonsteroidal anti-inflammatory drugs [NSAID], initial encounter: Secondary | ICD-10-CM | POA: Diagnosis not present

## 2017-10-04 DIAGNOSIS — J181 Lobar pneumonia, unspecified organism: Secondary | ICD-10-CM | POA: Diagnosis not present

## 2017-10-04 DIAGNOSIS — D6861 Antiphospholipid syndrome: Secondary | ICD-10-CM | POA: Diagnosis present

## 2017-10-04 DIAGNOSIS — D509 Iron deficiency anemia, unspecified: Secondary | ICD-10-CM | POA: Diagnosis present

## 2017-10-04 DIAGNOSIS — Z888 Allergy status to other drugs, medicaments and biological substances status: Secondary | ICD-10-CM

## 2017-10-04 DIAGNOSIS — I5031 Acute diastolic (congestive) heart failure: Secondary | ICD-10-CM | POA: Diagnosis not present

## 2017-10-04 DIAGNOSIS — Z515 Encounter for palliative care: Secondary | ICD-10-CM | POA: Diagnosis not present

## 2017-10-04 DIAGNOSIS — M059 Rheumatoid arthritis with rheumatoid factor, unspecified: Secondary | ICD-10-CM | POA: Diagnosis not present

## 2017-10-04 DIAGNOSIS — I440 Atrioventricular block, first degree: Secondary | ICD-10-CM | POA: Diagnosis present

## 2017-10-04 DIAGNOSIS — R0682 Tachypnea, not elsewhere classified: Secondary | ICD-10-CM | POA: Diagnosis not present

## 2017-10-04 DIAGNOSIS — J9690 Respiratory failure, unspecified, unspecified whether with hypoxia or hypercapnia: Secondary | ICD-10-CM | POA: Diagnosis not present

## 2017-10-04 DIAGNOSIS — I4940 Unspecified premature depolarization: Secondary | ICD-10-CM | POA: Diagnosis present

## 2017-10-04 DIAGNOSIS — D638 Anemia in other chronic diseases classified elsewhere: Secondary | ICD-10-CM | POA: Diagnosis present

## 2017-10-04 DIAGNOSIS — Z79891 Long term (current) use of opiate analgesic: Secondary | ICD-10-CM

## 2017-10-04 DIAGNOSIS — I471 Supraventricular tachycardia: Secondary | ICD-10-CM | POA: Diagnosis not present

## 2017-10-04 DIAGNOSIS — Z882 Allergy status to sulfonamides status: Secondary | ICD-10-CM

## 2017-10-04 DIAGNOSIS — R600 Localized edema: Secondary | ICD-10-CM | POA: Diagnosis not present

## 2017-10-04 DIAGNOSIS — T45515A Adverse effect of anticoagulants, initial encounter: Secondary | ICD-10-CM | POA: Diagnosis not present

## 2017-10-04 DIAGNOSIS — Z452 Encounter for adjustment and management of vascular access device: Secondary | ICD-10-CM | POA: Diagnosis not present

## 2017-10-04 DIAGNOSIS — M25511 Pain in right shoulder: Secondary | ICD-10-CM | POA: Diagnosis not present

## 2017-10-04 DIAGNOSIS — M069 Rheumatoid arthritis, unspecified: Secondary | ICD-10-CM | POA: Diagnosis present

## 2017-10-04 DIAGNOSIS — Z8249 Family history of ischemic heart disease and other diseases of the circulatory system: Secondary | ICD-10-CM

## 2017-10-04 DIAGNOSIS — F419 Anxiety disorder, unspecified: Secondary | ICD-10-CM | POA: Diagnosis present

## 2017-10-04 DIAGNOSIS — R7881 Bacteremia: Secondary | ICD-10-CM | POA: Diagnosis not present

## 2017-10-04 DIAGNOSIS — K2971 Gastritis, unspecified, with bleeding: Secondary | ICD-10-CM | POA: Diagnosis not present

## 2017-10-04 DIAGNOSIS — K140 Glossitis: Secondary | ICD-10-CM | POA: Diagnosis not present

## 2017-10-04 DIAGNOSIS — Z91041 Radiographic dye allergy status: Secondary | ICD-10-CM

## 2017-10-04 DIAGNOSIS — D649 Anemia, unspecified: Secondary | ICD-10-CM | POA: Diagnosis not present

## 2017-10-04 DIAGNOSIS — Z79899 Other long term (current) drug therapy: Secondary | ICD-10-CM

## 2017-10-04 DIAGNOSIS — Z818 Family history of other mental and behavioral disorders: Secondary | ICD-10-CM

## 2017-10-04 HISTORY — DX: Anemia, unspecified: D64.9

## 2017-10-04 HISTORY — DX: Rheumatoid arthritis, unspecified: M06.9

## 2017-10-04 LAB — URINALYSIS, ROUTINE W REFLEX MICROSCOPIC
Bilirubin Urine: NEGATIVE
GLUCOSE, UA: NEGATIVE mg/dL
KETONES UR: NEGATIVE mg/dL
Leukocytes, UA: NEGATIVE
NITRITE: NEGATIVE
PROTEIN: 30 mg/dL — AB
Specific Gravity, Urine: 1.016 (ref 1.005–1.030)
pH: 5 (ref 5.0–8.0)

## 2017-10-04 LAB — BASIC METABOLIC PANEL
ANION GAP: 13 (ref 5–15)
BUN: 53 mg/dL — AB (ref 6–20)
CALCIUM: 8.5 mg/dL — AB (ref 8.9–10.3)
CO2: 19 mmol/L — ABNORMAL LOW (ref 22–32)
CREATININE: 2.32 mg/dL — AB (ref 0.44–1.00)
Chloride: 97 mmol/L — ABNORMAL LOW (ref 101–111)
GFR calc Af Amer: 22 mL/min — ABNORMAL LOW (ref >60)
GFR, EST NON AFRICAN AMERICAN: 19 mL/min — AB (ref >60)
GLUCOSE: 82 mg/dL (ref 65–99)
Potassium: 4.9 mmol/L (ref 3.5–5.1)
Sodium: 129 mmol/L — ABNORMAL LOW (ref 135–145)

## 2017-10-04 LAB — CBC
HCT: 26.5 % — ABNORMAL LOW (ref 36.0–46.0)
HEMOGLOBIN: 8.8 g/dL — AB (ref 12.0–15.0)
MCH: 32.1 pg (ref 26.0–34.0)
MCHC: 33.2 g/dL (ref 30.0–36.0)
MCV: 96.7 fL (ref 78.0–100.0)
PLATELETS: 294 10*3/uL (ref 150–400)
RBC: 2.74 MIL/uL — ABNORMAL LOW (ref 3.87–5.11)
RDW: 14.5 % (ref 11.5–15.5)
WBC: 10.5 10*3/uL (ref 4.0–10.5)

## 2017-10-04 LAB — I-STAT TROPONIN, ED: TROPONIN I, POC: 0.04 ng/mL (ref 0.00–0.08)

## 2017-10-04 LAB — PROTIME-INR
INR: 1.14
PROTHROMBIN TIME: 14.5 s (ref 11.4–15.2)

## 2017-10-04 LAB — RAPID URINE DRUG SCREEN, HOSP PERFORMED
AMPHETAMINES: NOT DETECTED
Barbiturates: NOT DETECTED
Benzodiazepines: NOT DETECTED
COCAINE: NOT DETECTED
OPIATES: NOT DETECTED
Tetrahydrocannabinol: NOT DETECTED

## 2017-10-04 LAB — APTT: aPTT: 22 seconds — ABNORMAL LOW (ref 24–36)

## 2017-10-04 LAB — LACTIC ACID, PLASMA: Lactic Acid, Venous: 1.3 mmol/L (ref 0.5–1.9)

## 2017-10-04 LAB — PROCALCITONIN: Procalcitonin: 15.94 ng/mL

## 2017-10-04 LAB — TROPONIN I: Troponin I: 0.05 ng/mL (ref <0.03)

## 2017-10-04 LAB — I-STAT ARTERIAL BLOOD GAS, ED
ACID-BASE DEFICIT: 7 mmol/L — AB (ref 0.0–2.0)
BICARBONATE: 17.1 mmol/L — AB (ref 20.0–28.0)
O2 Saturation: 97 %
TCO2: 18 mmol/L — AB (ref 22–32)
pCO2 arterial: 29.8 mmHg — ABNORMAL LOW (ref 32.0–48.0)
pH, Arterial: 7.365 (ref 7.350–7.450)
pO2, Arterial: 94 mmHg (ref 83.0–108.0)

## 2017-10-04 LAB — CORTISOL: Cortisol, Plasma: 32.1 ug/dL

## 2017-10-04 LAB — BRAIN NATRIURETIC PEPTIDE: B NATRIURETIC PEPTIDE 5: 417.6 pg/mL — AB (ref 0.0–100.0)

## 2017-10-04 LAB — TSH: TSH: 2.502 u[IU]/mL (ref 0.350–4.500)

## 2017-10-04 LAB — I-STAT CG4 LACTIC ACID, ED: Lactic Acid, Venous: 1.71 mmol/L (ref 0.5–1.9)

## 2017-10-04 MED ORDER — SODIUM CHLORIDE 0.9 % IV SOLN
1.0000 g | Freq: Three times a day (TID) | INTRAVENOUS | Status: DC
  Administered 2017-10-04: 1 g via INTRAVENOUS
  Filled 2017-10-04: qty 1

## 2017-10-04 MED ORDER — SODIUM CHLORIDE 0.9 % IV SOLN
1.0000 g | Freq: Three times a day (TID) | INTRAVENOUS | Status: DC
  Filled 2017-10-04: qty 1

## 2017-10-04 MED ORDER — FENTANYL CITRATE (PF) 100 MCG/2ML IJ SOLN
25.0000 ug | INTRAMUSCULAR | Status: DC | PRN
  Administered 2017-10-04 – 2017-10-07 (×2): 25 ug via INTRAVENOUS
  Administered 2017-10-08 – 2017-10-09 (×4): 50 ug via INTRAVENOUS
  Administered 2017-10-09 – 2017-10-10 (×2): 100 ug via INTRAVENOUS
  Filled 2017-10-04 (×8): qty 2

## 2017-10-04 MED ORDER — SODIUM CHLORIDE 0.9 % IV BOLUS: 1000.0000 mL | Freq: Once | INTRAVENOUS | Status: DC

## 2017-10-04 MED ORDER — SODIUM CHLORIDE 0.9 % IV BOLUS (SEPSIS): 1000.0000 mL | Freq: Once | INTRAVENOUS | Status: DC

## 2017-10-04 MED ORDER — METHYLPREDNISOLONE SODIUM SUCC 40 MG IJ SOLR
40.0000 mg | Freq: Once | INTRAMUSCULAR | Status: AC
  Administered 2017-10-04: 40 mg via INTRAVENOUS
  Filled 2017-10-04: qty 1

## 2017-10-04 MED ORDER — HEPARIN BOLUS VIA INFUSION
4000.0000 [IU] | Freq: Once | INTRAVENOUS | Status: AC
  Administered 2017-10-04: 4000 [IU] via INTRAVENOUS
  Filled 2017-10-04: qty 4000

## 2017-10-04 MED ORDER — SODIUM CHLORIDE 0.9 % IV BOLUS
500.0000 mL | Freq: Once | INTRAVENOUS | Status: AC
  Administered 2017-10-04: 500 mL via INTRAVENOUS

## 2017-10-04 MED ORDER — VANCOMYCIN HCL IN DEXTROSE 1-5 GM/200ML-% IV SOLN
1000.0000 mg | INTRAVENOUS | Status: DC
  Filled 2017-10-04: qty 200

## 2017-10-04 MED ORDER — ALBUTEROL SULFATE (2.5 MG/3ML) 0.083% IN NEBU
2.5000 mg | INHALATION_SOLUTION | RESPIRATORY_TRACT | Status: DC | PRN
  Administered 2017-10-08 – 2017-10-12 (×5): 2.5 mg via RESPIRATORY_TRACT
  Filled 2017-10-04 (×6): qty 3

## 2017-10-04 MED ORDER — NOREPINEPHRINE BITARTRATE 1 MG/ML IV SOLN
5.0000 ug/min | INTRAVENOUS | Status: DC
  Filled 2017-10-04: qty 4

## 2017-10-04 MED ORDER — VANCOMYCIN HCL 10 G IV SOLR
2000.0000 mg | Freq: Once | INTRAVENOUS | Status: AC
  Administered 2017-10-04: 2000 mg via INTRAVENOUS
  Filled 2017-10-04: qty 2000

## 2017-10-04 MED ORDER — SODIUM CHLORIDE 0.9 % IV SOLN
500.0000 mg | INTRAVENOUS | Status: AC
  Administered 2017-10-04 – 2017-10-07 (×4): 500 mg via INTRAVENOUS
  Filled 2017-10-04 (×4): qty 500

## 2017-10-04 MED ORDER — BUDESONIDE 0.5 MG/2ML IN SUSP
0.5000 mg | Freq: Two times a day (BID) | RESPIRATORY_TRACT | Status: DC
  Administered 2017-10-05 – 2017-10-16 (×22): 0.5 mg via RESPIRATORY_TRACT
  Filled 2017-10-04 (×24): qty 2

## 2017-10-04 MED ORDER — FUROSEMIDE 10 MG/ML IJ SOLN
40.0000 mg | Freq: Four times a day (QID) | INTRAMUSCULAR | Status: DC
  Administered 2017-10-04 (×2): 40 mg via INTRAVENOUS
  Filled 2017-10-04: qty 4

## 2017-10-04 MED ORDER — LEVOTHYROXINE SODIUM 100 MCG PO TABS: 100.0000 ug | ORAL_TABLET | Freq: Every day | ORAL | Status: DC

## 2017-10-04 MED ORDER — HYDROCORTISONE NA SUCCINATE PF 100 MG IJ SOLR: 50.0000 mg | Freq: Four times a day (QID) | INTRAMUSCULAR | Status: DC

## 2017-10-04 MED ORDER — SODIUM CHLORIDE 0.9 % IV SOLN
0.0300 [IU]/min | INTRAVENOUS | Status: DC
  Filled 2017-10-04: qty 2

## 2017-10-04 MED ORDER — HEPARIN (PORCINE) IN NACL 100-0.45 UNIT/ML-% IJ SOLN
1450.0000 [IU]/h | INTRAMUSCULAR | Status: DC
  Administered 2017-10-04: 1300 [IU]/h via INTRAVENOUS
  Administered 2017-10-05: 1450 [IU]/h via INTRAVENOUS
  Administered 2017-10-06: 1650 [IU]/h via INTRAVENOUS
  Administered 2017-10-07 – 2017-10-08 (×2): 1700 [IU]/h via INTRAVENOUS
  Filled 2017-10-04 (×6): qty 250

## 2017-10-04 MED ORDER — SODIUM CHLORIDE 0.9 % IV SOLN
1.0000 g | Freq: Two times a day (BID) | INTRAVENOUS | Status: DC
  Administered 2017-10-04 – 2017-10-05 (×3): 1 g via INTRAVENOUS
  Filled 2017-10-04 (×3): qty 1

## 2017-10-04 NOTE — ED Notes (Signed)
Cardiology at bedside.

## 2017-10-04 NOTE — ED Triage Notes (Signed)
Pt arrives by [facility] from home where she lives with her husband. Per husband at 0100 he found his wife sitting in a chair with sob and trouble breathing. Husband then woke up at 0800 to find his wife still sitting in chair with worse sob, and less alert. Pt is alert on arrival oriented to person and place.  Pt sounds very congested with congested cough.

## 2017-10-04 NOTE — ED Notes (Signed)
MD Lynelle Doctor at bedside to place [address]

## 2017-10-04 NOTE — ED Notes (Signed)
Family at bedside. 

## 2017-10-04 NOTE — H&P (Signed)
PULMONARY / CRITICAL CARE MEDICINE   Name: Tonya Mcgee MRN: [id number] DOB: 1940/09/25    ADMISSION DATE:  10/04/2017  REFERRING MD:  Lynelle Doctor (EDP)   CHIEF COMPLAINT:  Respiratory distress   HISTORY OF PRESENT ILLNESS:   77 year old female with minimal past medical history other than hypertension resented 4/24 with respiratory distress.  According to her husband she has had 1 day history of shortness of breath, congestion, orthopnea.  She got out of bed last night to go sleep in the recliner due to orthopnea and when husband awoke this morning she was minimally responsive.  EMS placed her on BiPAP.  In ER she has had a labile blood pressure with SBP 60s at times.  Normal WBC, afebrile, BNP 417, normal lactate.  She remains on BiPAP, PCCM consulted for ICU admission.    PAST MEDICAL HISTORY :  She  has a past medical history of History of chicken pox, History of measles, and History of mumps.  PAST SURGICAL HISTORY: She  has a past surgical history that includes Carpal tunnel release (Right, 1990); Knee surgery (Left, 1989); Appendectomy (1960); Tonsillectomy (1946); Carotid doppler ultrasound (11/18/2013); pap smear (2012); mammogram screening (2012); Bone Density Study (2011); and Colonoscopy Screening (2011).  Allergies  Allergen Reactions  . Iodine Hives    ANTISEPTICS & DISINFECTANTS Throat swelling  . Sulfa Antibiotics     Vomiting and diarrhea  . Amoxicillin Hives  . Ciprofloxacin Hives  . Codeine Other (See Comments) and Hives    Weird dreams  . Keflex  [Cephalexin]   . Penicillins Hives  . Pravastatin Nausea Only  . Tape     skin red irritated    No current facility-administered medications on file prior to encounter.    Current Outpatient Medications on File Prior to Encounter  Medication Sig  . albuterol (PROVENTIL HFA;VENTOLIN HFA) 108 (90 Base) MCG/ACT inhaler Inhale 2 puffs into the lungs every 6 (six) hours as needed for wheezing or shortness of breath.  Marland Kitchen  aspirin 81 MG tablet Take 1 tablet by mouth daily.  Marland Kitchen atorvastatin (LIPITOR) 10 MG tablet TAKE 1 TABLET DAILY  . Ferrous Sulfate (IRON) 325 (65 Fe) MG TABS Take 1 tablet by mouth daily.  . furosemide (LASIX) 20 MG tablet Take 1 tablet (20 mg total) by mouth daily.  . hydroxychloroquine (PLAQUENIL) 200 MG tablet Take 2 tablets by mouth daily.  Marland Kitchen levothyroxine (SYNTHROID, LEVOTHROID) 100 MCG tablet TAKE ONE TABLET DAILY  . lisinopril-hydrochlorothiazide (PRINZIDE,ZESTORETIC) 10-12.5 MG tablet TAKE 1 TABLET DAILY  . Multiple Vitamins-Minerals (MULTIVITAMIN ADULT PO) Take 1 tablet by mouth daily.  . Omega 3 1200 MG CAPS Take 1 capsule by mouth daily.  . cyclobenzaprine (FLEXERIL) 5 MG tablet Take 1 tablet (5 mg total) by mouth at bedtime.  . naproxen (NAPROSYN) 500 MG tablet Take 1 tablet (500 mg total) by mouth 2 (two) times daily as needed.  . traMADol (ULTRAM) 50 MG tablet Take 1 tablet (50 mg total) by mouth every 8 (eight) hours as needed.    FAMILY HISTORY:  Her indicated that her mother is deceased. She indicated that her father is deceased. She indicated that her sister is alive. She indicated that her brother is alive.   SOCIAL HISTORY: She  reports that she quit smoking about 39 years ago. Her smoking use included cigarettes. She has a 7.50 pack-year smoking history. She has never used smokeless tobacco. She reports that she drinks alcohol. She reports that she does not use drugs.  REVIEW  OF SYSTEMS:   As per HPI obtained from records, husband, RN- All other systems reviewed and were neg.    SUBJECTIVE:    VITAL SIGNS: BP (!) 108/50   Pulse 95   Temp 98.9 F (37.2 C) (Tympanic)   Resp 20   SpO2 92%   HEMODYNAMICS:    VENTILATOR SETTINGS: FiO2 (%):  [50 %] 50 %  INTAKE / OUTPUT: No intake/output data recorded.  PHYSICAL EXAMINATION: General:  Chronically ill appearing female, moderate respiratory distress Neuro:  Lethargic but easily arousable and moving all ext to  command HEENT:  [place]/AT, PERRL, EOM-I and MMM Cardiovascular:  RRR, Nl S1/S2 and -M/R/G Lungs:  Diffuse crackles R>L Abdomen:  Soft, distended, NT and +BS Musculoskeletal:  2+ edema and -tenderness Skin:  Intact but thin  LABS:  BMET Recent Labs  Lab 10/04/17 0958  NA 129*  K 4.9  CL 97*  CO2 19*  BUN 53*  CREATININE 2.32*  GLUCOSE 82    Electrolytes Recent Labs  Lab 10/04/17 0958  CALCIUM 8.5*    CBC Recent Labs  Lab 10/04/17 0958  WBC 10.5  HGB 8.8*  HCT 26.5*  PLT 294    Coag's No results for input(s): APTT, INR in the last 168 hours.  Sepsis Markers Recent Labs  Lab 10/04/17 1008  LATICACIDVEN 1.71    ABG Recent Labs  Lab 10/04/17 1038  PHART 7.365  PCO2ART 29.8*  PO2ART 94.0    Liver Enzymes No results for input(s): AST, ALT, ALKPHOS, BILITOT, ALBUMIN in the last 168 hours.  Cardiac Enzymes No results for input(s): TROPONINI, PROBNP in the last 168 hours.  Glucose No results for input(s): GLUCAP in the last 168 hours.  Imaging Dg Chest Port 1 View  Result Date: 10/04/2017 CLINICAL DATA:  Shortness of breath EXAM: PORTABLE CHEST 1 VIEW COMPARISON:  July 11, 2016 FINDINGS: There is extensive airspace consolidation throughout portions of the right upper lobe and right middle lobe. There is fibrosis in both lung bases. There is no edema or consolidation on the left. Heart size and pulmonary vascular normal. No adenopathy. There is aortic atherosclerosis. No evident bone lesions. IMPRESSION: Extensive airspace consolidation on the right involving several segmental regions, consistent with pneumonia. 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 No edema evident. Heart size normal. There is aortic atherosclerosis. No evident adenopathy. Aortic Atherosclerosis (ICD10-I70.0). Electronically Signed   By: Bretta Bang III M.D.   On: 10/04/2017 10:42     STUDIES:  2D echo 4/24>>>    CULTURES: BC x2 4/24>>> Sputum 4/24>>> Urine  4/24>>>  ANTIBIOTICS: Meropenem 4/24>>> Azithro 4/24>>> vanc 4/24>>>  SIGNIFICANT EVENTS:   LINES/TUBES: R rad aline 4/24>>> R IJ CVL 4/24>>>   DISCUSSION: 77 year old female with questionable PMH of CHF presenting to the ED with respiratory failure, hypoxemia, BiPAP and septic shock from a RLL PNA.  On exam, diffuse crackles R>L.  I reviewed CXR myself, diffuse pulmonary edema and RLL infiltrate.  Discussed with PCCM-NP.  ASSESSMENT / PLAN:  PULMONARY A: Acute on chronic respiratory failure from CHF and CAP P:   - BiPAP for now - If further deterioration then intubated - Titrate O2 for sat 88-92% - Abx as below - If BP is stable then   CARDIOVASCULAR A:  Septic shock ?CHF P:  - 2D echo - IVF gently - CVP q shift - EKG - Abx as below  RENAL A:   Acute renal failure P:   - KVO IVF - Lasix 40 mg  IV q6 x3 doses if BP permits - BMET in AM - Replace electrolytes as indicated  GASTROINTESTINAL A:   No active issues P:   - NPO - Protonix  HEMATOLOGIC A:   Leukocytosis P:  - See ID section - Transfuse per ICU protocol - CBC in AM  INFECTIOUS A:   CAP P:   - Merrem - Vanc - Zithromax - Pan culture - F/u on culture - PCT protocol  ENDOCRINE A:   Hypothyroid   P:   - One half dose of home PO thyroxin - TSH and T4  NEUROLOGIC A:   AMS due to sepsis P:   RASS goal: N/A - Hold sedating medications - Monitor clinically  FAMILY  - Updates: Family updated bedside, full code status.  Husband bringing living will now.  - Inter-disciplinary family meet or Palliative Care meeting due by:  day 7  The patient is critically ill with multiple organ systems failure and requires high complexity decision making for assessment and support, frequent evaluation and titration of therapies, application of advanced monitoring technologies and extensive interpretation of multiple databases.   Critical Care Time devoted to patient care services described  in this note is  95  Minutes. This time reflects time of care of this signee Dr Koren Bound. This critical care time does not reflect procedure time, or teaching time or supervisory time of PA/NP/Med student/Med Resident etc but could involve care discussion time.  Alyson Reedy, M.D. [facility] Pulmonary/Critical Care Medicine. Pager: [phone]. After hours pager: [phone].  10/04/2017, 12:47 PM

## 2017-10-04 NOTE — ED Notes (Signed)
Pt was given 125 soul medrol 5mg  albuterol and 0.5 Atrovent by EMS.

## 2017-10-04 NOTE — Progress Notes (Signed)
  Echocardiogram 2D Echocardiogram has been performed.  Tonya Mcgee F 10/04/2017, 1:55 PM

## 2017-10-04 NOTE — ED Provider Notes (Signed)
[facility] EMERGENCY DEPARTMENT Provider Note   CSN: [id number] Arrival date & time: 10/04/17  9892     History   Chief Complaint Chief Complaint  Patient presents with  . Respiratory Distress    HPI Tonya Mcgee is a 77 y.o. female.  HPI Patient presents emergency room for evaluation of shortness of breath.  Patient started having trouble with shortness of breath last evening .  Patient has also noticed leg swelling.  Patient's husband called EMS this morning.  According to the EMS report the husband noted that the patient was having shortness of breath at about 1 AM.  She was sitting up in a chair having difficulty with her breathing.  When the husband woke up at 8 AM she appeared worse.  She is also not as alert.  EMS was called.  They noted that she was very congested.  She was given Solu-Medrol and albuterol.  Denies any chest pain.  She denies any fevers.  Past Medical History:  Diagnosis Date  . History of chicken pox   . History of measles   . History of mumps     Patient Active Problem List   Diagnosis Date Noted  . Back pain 02/07/2017  . Aortic atherosclerosis (HCC) 10/14/2015  . Arthralgia 08/11/2015  . Osteoarthritis 07/16/2015  . Rheumatoid arthritis (HCC) 07/16/2015  . Iron deficiency anemia 07/16/2015  . Antiphospholipid syndrome (HCC) 07/15/2015  . Arthritis 07/15/2015  . Carotid bruit 07/15/2015  . 1st degree AV block 07/15/2015  . History of blood clots 07/15/2015  . History of Graves' disease 07/15/2015  . Hypercholesterolemia 07/15/2015  . Hypertension 07/15/2015  . 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 Joint swelling 07/15/2015    Past Surgical History:  Procedure Laterality Date  . APPENDECTOMY  1960  . Bone Density Study  2011   within normal limits per patient report  . Carotid doppler ultrasound  11/18/2013   mild bilateral plaque formation. No hemodynamically significant stenosis  . CARPAL TUNNEL RELEASE Right 1990    . Colonoscopy Screening  2011   normal per patient report  . KNEE SURGERY Left 1989   arthroscopic  . mammogram screening  2012   normal per patient  . pap smear  2012   normal per patient  . TONSILLECTOMY  1946     OB History    Gravida  3   Para  2   Term      Preterm      AB      Living        SAB      TAB      Ectopic      Multiple      Live Births               Home Medications    Prior to Admission medications   Medication Sig Start Date End Date Taking? Authorizing Provider  albuterol (PROVENTIL HFA;VENTOLIN HFA) 108 (90 Base) MCG/ACT inhaler Inhale 2 puffs into the lungs every 6 (six) hours as needed for wheezing or shortness of breath. 12/16/16  Yes Malva Limes, MD  aspirin 81 MG tablet Take 1 tablet by mouth daily.   Yes [provider]  atorvastatin (LIPITOR) 10 MG tablet TAKE 1 TABLET DAILY 08/14/17  Yes Malva Limes, MD  Ferrous Sulfate (IRON) 325 (65 Fe) MG TABS Take 1 tablet by mouth daily. 11/16/13  Yes [provider]  furosemide (LASIX) 20 MG tablet Take 1 tablet (  20 mg total) by mouth daily. 02/06/17  Yes Malva Limes, MD  hydroxychloroquine (PLAQUENIL) 200 MG tablet Take 2 tablets by mouth daily. 11/16/13  Yes [provider]  levothyroxine (SYNTHROID, LEVOTHROID) 100 MCG tablet TAKE ONE TABLET DAILY 08/20/17  Yes Malva Limes, MD  lisinopril-hydrochlorothiazide (PRINZIDE,ZESTORETIC) 10-12.5 MG tablet TAKE 1 TABLET DAILY 07/08/17  Yes Malva Limes, MD  Multiple Vitamins-Minerals (MULTIVITAMIN ADULT PO) Take 1 tablet by mouth daily.   Yes [provider]  Omega 3 1200 MG CAPS Take 1 capsule by mouth daily.   Yes [provider]  cyclobenzaprine (FLEXERIL) 5 MG tablet Take 1 tablet (5 mg total) by mouth at bedtime. 02/06/17   Malva Limes, MD  naproxen (NAPROSYN) 500 MG tablet Take 1 tablet (500 mg total) by mouth 2 (two) times daily as needed. 10/06/15   Malva Limes, MD   traMADol (ULTRAM) 50 MG tablet Take 1 tablet (50 mg total) by mouth every 8 (eight) hours as needed. 02/06/17   Malva Limes, MD    Family History Family History  Problem Relation Age of Onset  . Congestive Heart Failure Mother   . Liver cancer Father   . Depression Sister     Social History Social History   Tobacco Use  . Smoking status: Former Smoker    Packs/day: 0.50    Years: 15.00    Pack years: 7.50    Types: Cigarettes    Last attempt to quit: 06/13/1978    Years since quitting: 39.3  . Smokeless tobacco: Never Used  Substance Use Topics  . Alcohol use: Yes    Alcohol/week: 0.0 oz    Comment: occasional use; 0-3 a month  . Drug use: No     Allergies   Iodine; Sulfa antibiotics; Amoxicillin; Ciprofloxacin; Codeine; Keflex  [cephalexin]; Penicillins; Pravastatin; and Tape   Review of Systems Review of Systems  All other systems reviewed and are negative.    Physical Exam Updated Vital Signs BP 105/65   Pulse 96   Temp 98.9 F (37.2 C) (Tympanic)   Resp (!) 25   SpO2 90%   Physical Exam  Constitutional: She appears distressed.  HENT:  Head: Normocephalic and atraumatic.  Right Ear: External ear normal.  Left Ear: External ear normal.  Eyes: Conjunctivae are normal. Right eye exhibits no discharge. Left eye exhibits no discharge. No scleral icterus.  Neck: Neck supple. No tracheal deviation present.  Cardiovascular: Normal rate, regular rhythm and intact distal pulses.  Pulmonary/Chest: No stridor. She is in respiratory distress. She has wheezes. She has rales.  Abdominal: Soft. Bowel sounds are normal. She exhibits no distension. There is no tenderness. There is no rebound and no guarding.  Musculoskeletal: She exhibits edema. She exhibits no tenderness.  Diffuse edema in her flank and her extremities  Neurological: She is alert. She has normal strength. No cranial nerve deficit (no facial droop, extraocular movements intact, no slurred speech)  or sensory deficit. She exhibits normal muscle tone. She displays no seizure activity. Coordination normal.  Skin: Skin is warm and dry. No rash noted.  Psychiatric: She has a normal mood and affect.  Nursing note and vitals reviewed.    ED Treatments / Results  Labs (all labs ordered are listed, but only abnormal results are displayed) Labs Reviewed  BASIC METABOLIC PANEL - Abnormal; Notable for the following components:      Result Value   Sodium 129 (*)    Chloride 97 (*)  CO2 19 (*)    BUN 53 (*)    Creatinine, Ser 2.32 (*)    Calcium 8.5 (*)    GFR calc non Af Amer 19 (*)    GFR calc Af Amer 22 (*)    All other components within normal limits  CBC - Abnormal; Notable for the following components:   RBC 2.74 (*)    Hemoglobin 8.8 (*)    HCT 26.5 (*)    All other components within normal limits  BRAIN NATRIURETIC PEPTIDE - Abnormal; Notable for the following components:   B Natriuretic Peptide 417.6 (*)    All other components within normal limits  I-STAT ARTERIAL BLOOD GAS, ED - Abnormal; Notable for the following components:   pCO2 arterial 29.8 (*)    Bicarbonate 17.1 (*)    TCO2 18 (*)    Acid-base deficit 7.0 (*)    All other components within normal limits  CULTURE, BLOOD (ROUTINE X 2)  CULTURE, BLOOD (ROUTINE X 2)  I-STAT TROPONIN, ED  I-STAT CG4 LACTIC ACID, ED  I-STAT CG4 LACTIC ACID, ED    EKG EKG Interpretation  Date/Time:  Wednesday October 04 2017 09:51:02 EDT Ventricular Rate:  102 PR Interval:    QRS Duration: 96 QT Interval:  319 QTC Calculation: 416 R Axis:   58 Text Interpretation:  Sinus tachycardia RSR' in V1 or V2, right VCD or RVH No old tracing to compare Confirmed by Linwood Dibbles [phone]) on 10/04/2017 10:01:20 AM Also confirmed by Linwood Dibbles [phone]), editor [place], Tamera Punt ([id number])  on 10/04/2017 10:14:12 AM   Radiology Dg Chest Port 1 View  Result Date: 10/04/2017 CLINICAL DATA:  Shortness of breath EXAM: PORTABLE CHEST 1 VIEW  COMPARISON:  July 11, 2016 FINDINGS: There is extensive airspace consolidation throughout portions of the right upper lobe and right middle lobe. There is fibrosis in both lung bases. There is no edema or consolidation on the left. Heart size and pulmonary vascular normal. No adenopathy. There is aortic atherosclerosis. No evident bone lesions. IMPRESSION: Extensive airspace consolidation on the right involving several segmental regions, consistent with pneumonia. 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 No edema evident. Heart size normal. There is aortic atherosclerosis. No evident adenopathy. Aortic Atherosclerosis (ICD10-I70.0). Electronically Signed   By: Bretta Bang III M.D.   On: 10/04/2017 10:42    Procedures .Critical Care Performed by: Linwood Dibbles, MD Authorized by: Linwood Dibbles, MD   Critical care provider statement:    Critical care time (minutes):  45   Critical care was necessary to treat or prevent imminent or life-threatening deterioration of the following conditions:  Shock   Critical care was time spent personally by me on the following activities:  Discussions with consultants, evaluation of patient's response to treatment, examination of patient, ordering and performing treatments and interventions, ordering and review of laboratory studies, ordering and review of radiographic studies, pulse oximetry, re-evaluation of patient's condition, obtaining history from patient or surrogate and review of old charts .Central Line Date/Time: 10/04/2017 12:42 PM Performed by: Linwood Dibbles, MD Authorized by: Linwood Dibbles, MD   Consent:    Consent obtained:  Verbal and emergent situation   Consent given by:  Patient   Risks discussed:  Arterial puncture, bleeding, incorrect placement, infection and nerve damage   Alternatives discussed:  Alternative treatment Universal protocol:    Procedure explained and questions answered to patient or proxy's satisfaction: yes     Relevant documents present and  verified: yes     Test results  available and properly labeled: yes     Imaging studies available: yes     Required blood products, implants, devices, and special equipment available: yes     Site/side marked: yes     Immediately prior to procedure, a time out was called: yes     Patient identity confirmed:  Verbally with patient Pre-procedure details:    Hand hygiene: Hand hygiene performed prior to insertion     Sterile barrier technique: All elements of maximal sterile technique followed     Skin preparation:  2% chlorhexidine   Skin preparation agent: Skin preparation agent completely dried prior to procedure   Anesthesia (see MAR for exact dosages):    Anesthesia method:  Local infiltration   Local anesthetic:  Lidocaine 1% w/o epi Procedure details:    Location:  R internal jugular   Patient position:  Flat   Procedural supplies:  Triple lumen   Landmarks identified: yes     Ultrasound guidance: yes     Sterile ultrasound techniques: Sterile gel and sterile probe covers were used     Number of attempts:  1   Successful placement: yes   Post-procedure details:    Post-procedure:  Dressing applied and line sutured   Assessment:  Blood return through all ports, free fluid flow and placement verified by x-ray   Patient tolerance of procedure:  Tolerated well, no immediate complications   (including critical care time)  Medications Ordered in ED Medications  aztreonam (AZACTAM) 1 g in sodium chloride 0.9 % 100 mL IVPB (has no administration in time range)  sodium chloride 0.9 % bolus 500 mL (0 mLs Intravenous Stopped 10/04/17 1238)     Initial Impression / Assessment and Plan / ED Course  I have reviewed the triage vital signs and the nursing notes.  Pertinent labs & imaging results that were available during my care of the patient were reviewed by me and considered in my medical decision making (see chart for details).  Clinical Course as of Oct 04 1240  Wed Oct 04, 2017   1004 Patient presents with significant difficulty in her breathing.  Blood pressure is also low.  I will hold off on any fluid resuscitation right now as I think she is in severe pulmonary edema.  Doubt acute infection/sepsis at this time although we will still need to monitor for that.  We will check chest x-rays and monitor closely.   [JK]  1103 Chest x-ray interpretation suggests pneumonia.  However the patient does not have a lactic acidosis or leukocytosis.  Her physical exam suggests a large amount of peripheral edema.  I am still suspicious for atypical pulmonary edema.  With her low blood pressures I will start her on fluids.   [JK]  1111 SBP is now above 100.   Pt appears to be breathing more easily.   [JK]  1112 Will hold off on full fluid bolus with the diagnositic uncertainty.  Normal CBC and lactic acid argues against sepsis   [JK]  1127 Discussed with pharmacy regarding abx regimen.   [JK]  1128 Hgb stable  CBC(!) [JK]  1128 AKI noted  Basic metabolic panel(!) [JK]  1236 BP is very labile.  Up and down.  Central line placed in anticipation of possible pressors.  Pt will also need an a line for better BP monitoring.   [JK]    Clinical Course User Index [JK] Linwood Dibbles, MD    Patient presented to the emergency room for acute shortness  of breath.  Her evaluation is concerning for the possibility of pneumonia versus congestive heart failure or a combination of the 2.  Patient has diffuse peripheral edema.  She has distended neck veins.  Patient has acute kidney injury associated with elevated BNP.  Chest x-ray is more suggestive of pneumonia.  However wonder if this may be asymmetric edema.  Stat Echocardiogram has been ordered.  Patient's blood pressure has been into the 80s and then back up into the 100s.  I have ordered an epinephrine infusion to maintain her blood pressure.  Patient has been given IV fluids but have held off on large fluid boluses considering her congestive heart  failure component, lack of lactic acidosis, fever or elevated white blood cell count.  Etiology was consulted.  Appreciate their input.  Echocardiogram is pending.  I have also consulted with pulmonary critical care for admission. Final Clinical Impressions(s) / ED Diagnoses   Final diagnoses:  Acute congestive heart failure, unspecified heart failure type (HCC)  Pulmonary infiltrate      Linwood Dibbles, MD 10/04/17 1243

## 2017-10-04 NOTE — Progress Notes (Signed)
RT NOTE:  MD @ bedside requesting Heated High Flow. Pt does not tolerate BIPAP.  Current setting of 30L flow, 100% FiO2. Dr. Ardeth Perfect request FiO2 remain @ 100% continuous, only titrate flow. Pt tolerating well. Pt encouraged to cough.

## 2017-10-04 NOTE — ED Notes (Addendum)
CCM at bedside speaking with patient and family. RT is setting up for Arterial Line due to frequent fluctuations in blood pressure and to determine treatment plan.

## 2017-10-04 NOTE — Progress Notes (Signed)
ANTICOAGULATION CONSULT NOTE - Initial Consult  Pharmacy Consult for heparin drip Indication: Antiphospholipid antibody syndrome  Allergies  Allergen Reactions  . Iodine Hives    ANTISEPTICS & DISINFECTANTS Throat swelling  . Sulfa Antibiotics     Vomiting and diarrhea  . Amoxicillin Hives  . Ciprofloxacin Hives  . Codeine Other (See Comments) and Hives    Weird dreams  . Keflex  [Cephalexin]   . Penicillins Hives  . Pravastatin Nausea Only  . Tape     skin red irritated    Patient Measurements:   Heparin Dosing Weight: 95 kg  Vital Signs: Temp: 99.5 F (37.5 C) (04/24 2030) Temp Source: Axillary (04/24 2030) BP: 124/91 (04/24 2100) Pulse Rate: 117 (04/24 2100)  Labs: Recent Labs    10/04/17 0958 10/04/17 1408  HGB 8.8*  --   HCT 26.5*  --   PLT 294  --   APTT  --  22*  LABPROT  --  14.5  INR  --  1.14  CREATININE 2.32*  --   TROPONINI  --  0.05*    CrCl cannot be calculated (Unknown ideal weight.).   Medical History: Past Medical History:  Diagnosis Date  . History of chicken pox   . History of measles   . History of mumps      Assessment: Tonya Mcgee is a 77 y.o. female admitted on 10/04/2017 with sepsis, PNA. Patient has a history of positive antiphospholipid antibody syndrome. Patient does not appear to be taking any anticoagulation for this PTA, but was apparently on warfarin in the past which she stop taking for non-hemorrhagic reasons. Baseline aPTT 22.    Goal of Therapy:  Heparin level 0.3-0.7 units/ml Monitor platelets by anticoagulation protocol: Yes   Plan:  Heparin bolus 4000 units Heparin drip at 1300 units/hr HL in 8 hours Daily CBC and heparin level  [name] A. Jeanella Craze, PharmD, BCPS Clinical Pharmacist [place] Pager: [phone]  10/04/2017,9:43 PM

## 2017-10-04 NOTE — Consult Note (Signed)
Cardiology Consultation:   Patient ID: Tonya Mcgee; [id number]; 1940/12/04   Admit date: 10/04/2017 Date of Consult: 10/04/2017  Primary Care Provider: Malva Limes, MD Primary Cardiologist: No primary care provider on file. New   Patient Profile:   Tonya Mcgee is a 77 y.o. female with no prior cardiac history  who is being seen today for the evaluation of acute respiratory failure  at the request of Dr Lynelle Doctor.  History of Present Illness:   Ms. Fate reports sudden onset of SOB yesterday. Had a cough for 3 days but this seemed to get better yesterday. No chest pain. No increased swelling. Noted progressive SOB and presented to ED today. Now on Bipap. She does report some swelling in the past and uses lasix on a prn basis. She has a history of HTN on lisinopril HCT. History of hypercholesterolemia on lipitor. Does take Naprosyn regularly.  Past Medical History:  Diagnosis Date  . History of chicken pox   . History of measles   . History of mumps     Past Surgical History:  Procedure Laterality Date  . APPENDECTOMY  1960  . Bone Density Study  2011   within normal limits per patient report  . Carotid doppler ultrasound  11/18/2013   mild bilateral plaque formation. No hemodynamically significant stenosis  . CARPAL TUNNEL RELEASE Right 1990  . Colonoscopy Screening  2011   normal per patient report  . KNEE SURGERY Left 1989   arthroscopic  . mammogram screening  2012   normal per patient  . pap smear  2012   normal per patient  . TONSILLECTOMY  1946     Home Medications:  Prior to Admission medications   Medication Sig Start Date End Date Taking? Authorizing Provider  albuterol (PROVENTIL HFA;VENTOLIN HFA) 108 (90 Base) MCG/ACT inhaler Inhale 2 puffs into the lungs every 6 (six) hours as needed for wheezing or shortness of breath. 12/16/16  Yes Malva Limes, MD  aspirin 81 MG tablet Take 1 tablet by mouth daily.   Yes [provider]  atorvastatin  (LIPITOR) 10 MG tablet TAKE 1 TABLET DAILY 08/14/17  Yes Malva Limes, MD  Ferrous Sulfate (IRON) 325 (65 Fe) MG TABS Take 1 tablet by mouth daily. 11/16/13  Yes [provider]  furosemide (LASIX) 20 MG tablet Take 1 tablet (20 mg total) by mouth daily. 02/06/17  Yes Malva Limes, MD  hydroxychloroquine (PLAQUENIL) 200 MG tablet Take 2 tablets by mouth daily. 11/16/13  Yes [provider]  levothyroxine (SYNTHROID, LEVOTHROID) 100 MCG tablet TAKE ONE TABLET DAILY 08/20/17  Yes Malva Limes, MD  lisinopril-hydrochlorothiazide (PRINZIDE,ZESTORETIC) 10-12.5 MG tablet TAKE 1 TABLET DAILY 07/08/17  Yes Malva Limes, MD  Multiple Vitamins-Minerals (MULTIVITAMIN ADULT PO) Take 1 tablet by mouth daily.   Yes [provider]  Omega 3 1200 MG CAPS Take 1 capsule by mouth daily.   Yes [provider]  cyclobenzaprine (FLEXERIL) 5 MG tablet Take 1 tablet (5 mg total) by mouth at bedtime. 02/06/17   Malva Limes, MD  naproxen (NAPROSYN) 500 MG tablet Take 1 tablet (500 mg total) by mouth 2 (two) times daily as needed. 10/06/15   Malva Limes, MD  traMADol (ULTRAM) 50 MG tablet Take 1 tablet (50 mg total) by mouth every 8 (eight) hours as needed. 02/06/17   Malva Limes, MD    Inpatient Medications: Scheduled Meds:  Continuous Infusions: . aztreonam    . sodium chloride 500  mL (10/04/17 1121)   PRN Meds:   Allergies:    Allergies  Allergen Reactions  . Iodine Hives    ANTISEPTICS & DISINFECTANTS Throat swelling  . Sulfa Antibiotics     Vomiting and diarrhea  . Amoxicillin Hives  . Ciprofloxacin Hives  . Codeine Other (See Comments) and Hives    Weird dreams  . Keflex  [Cephalexin]   . Penicillins Hives  . Pravastatin Nausea Only  . Tape     skin red irritated    Social History:   Social History   Socioeconomic History  . Marital status: Married    Spouse name: Not on file  . Number of children: Not on file  . Years of education:  Not on file  . Highest education level: Not on file  Occupational History  . Occupation: Retired    Comment: Educational psychologist  Social Needs  . Financial resource strain: Not on file  . Food insecurity:    Worry: Not on file    Inability: Not on file  . Transportation needs:    Medical: Not on file    Non-medical: Not on file  Tobacco Use  . Smoking status: Former Smoker    Packs/day: 0.50    Years: 15.00    Pack years: 7.50    Types: Cigarettes    Last attempt to quit: 06/13/1978    Years since quitting: 39.3  . Smokeless tobacco: Never Used  Substance and Sexual Activity  . Alcohol use: Yes    Alcohol/week: 0.0 oz    Comment: occasional use; 0-3 a month  . Drug use: No  . Sexual activity: Not on file  Lifestyle  . Physical activity:    Days per week: Not on file    Minutes per session: Not on file  . Stress: Not on file  Relationships  . Social connections:    Talks on phone: Not on file    Gets together: Not on file    Attends religious service: Not on file    Active member of club or organization: Not on file    Attends meetings of clubs or organizations: Not on file    Relationship status: Not on file  . Intimate partner violence:    Fear of current or ex partner: Not on file    Emotionally abused: Not on file    Physically abused: Not on file    Forced sexual activity: Not on file  Other Topics Concern  . Not on file  Social History Narrative  . Not on file    Family History:    Family History  Problem Relation Age of Onset  . Congestive Heart Failure Mother   . Liver cancer Father   . Depression Sister      ROS:  Please see the history of present illness.   All other ROS reviewed and negative.     Physical Exam/Data:   Vitals:   10/04/17 1015 10/04/17 1030 10/04/17 1100 10/04/17 1115  BP: (!) 89/47 (!) 82/56 (!) 116/56 (!) 119/102  Pulse: 99 94 92 97  Resp: (!) 23 (!) 21 (!) 22 17  Temp:      TempSrc:      SpO2: 93% 95% 97% 97%   No intake  or output data in the 24 hours ending 10/04/17 1129 There were no vitals filed for this visit. There is no height or weight on file to calculate BMI.  General:  Well nourished, obese, in no acute distress  on Bipap HEENT: normal Lymph: no adenopathy Neck: no JVD Endocrine:  No thryomegaly Vascular: No carotid bruits; FA pulses 2+ bilaterally without bruits  Cardiac:  normal S1, S2; RRR; no murmur  Lungs:  Decreased BS and rales in right lung Abd: soft, nontender, no hepatomegaly  Ext: no edema, skin in lower extremities is thickened and sclerotic Musculoskeletal:  No deformities, BUE and BLE strength normal and equal Skin: warm and dry  Neuro:  CNs 2-12 intact, no focal abnormalities noted Psych:  Normal affect   EKG:  The EKG was personally reviewed and demonstrates:  NSR with no significant ST-T changes.  Telemetry:  Telemetry was personally reviewed and demonstrates:  NSR  Relevant CV Studies: none  Laboratory Data:  Chemistry Recent Labs  Lab 10/04/17 0958  NA 129*  K 4.9  CL 97*  CO2 19*  GLUCOSE 82  BUN 53*  CREATININE 2.32*  CALCIUM 8.5*  GFRNONAA 19*  GFRAA 22*  ANIONGAP 13    No results for input(s): PROT, ALBUMIN, AST, ALT, ALKPHOS, BILITOT in the last 168 hours. Hematology Recent Labs  Lab 10/04/17 0958  WBC 10.5  RBC 2.74*  HGB 8.8*  HCT 26.5*  MCV 96.7  MCH 32.1  MCHC 33.2  RDW 14.5  PLT 294   Cardiac EnzymesNo results for input(s): TROPONINI in the last 168 hours.  Recent Labs  Lab 10/04/17 1006  TROPIPOC 0.04    BNP Recent Labs  Lab 10/04/17 0954  BNP 417.6*    DDimer No results for input(s): DDIMER in the last 168 hours.  Radiology/Studies:  Dg Chest Port 1 View  Result Date: 10/04/2017 CLINICAL DATA:  Shortness of breath EXAM: PORTABLE CHEST 1 VIEW COMPARISON:  July 11, 2016 FINDINGS: There is extensive airspace consolidation throughout portions of the right upper lobe and right middle lobe. There is fibrosis in both lung  bases. There is no edema or consolidation on the left. Heart size and pulmonary vascular normal. No adenopathy. There is aortic atherosclerosis. No evident bone lesions. IMPRESSION: Extensive airspace consolidation on the right involving several segmental regions, consistent with pneumonia. 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 No edema evident. Heart size normal. There is aortic atherosclerosis. No evident adenopathy. Aortic Atherosclerosis (ICD10-I70.0). Electronically Signed   By: Bretta Bang III M.D.   On: 10/04/2017 10:42    Assessment and Plan:   1. Acute respiratory failure. Etiology is most likely PNA with very asymmetric pulmonary infiltrates involving the right upper and mid lung zones. Doubt acute CHF. Echo currently being done- by my interpretation LV function looks normal. No significant valvular abnormality. Elevated BNP probably due to RV strain with respiratory failure. Plan Bipap support and IV antibiotics per CCM. Would use diuretics judiciously in setting of ARF.  2. Acute renal failure. Creatinine 2.32. Aggravated by NSAID use and hypotension.  3. Anemia etiology unclear. Heme check stool. Follow.    For questions or updates, please contact [facility] Please consult [email or web address] for contact info under Cardiology/STEMI.   Signed, Jimel Myler [place], MD  10/04/2017 11:29 AM

## 2017-10-04 NOTE — ED Notes (Signed)
ED Provider at bedside. 

## 2017-10-04 NOTE — ED Notes (Signed)
Pt placed on BiPAP

## 2017-10-04 NOTE — Progress Notes (Signed)
[facility]Link Physician-Brief Progress Note Patient Name: Tonya Mcgee DOB: [birth year] MRN: [id number]   Date of Service  10/04/2017  HPI/Events of Note  nursse called saying patient restless and in pain , requesting iv pain med  eICU Interventions  Camera in, patient on bipap. At risk of aspiration. Ordered fentanyl iv prn.      Intervention Category Intermediate Interventions: Pain - evaluation and management  Wilber Oliphant 10/04/2017, 8:09 PM

## 2017-10-04 NOTE — Progress Notes (Addendum)
Pharmacy Antibiotic Note  Tonya Mcgee is a 77 y.o. female admitted on 10/04/2017 with sepsis, PNA.  Pharmacy has been consulted for meropenem/vancomycin dosing. AFeb, WBC 10.5. AKI - SCr 2.32 on admit, normalized CrCl~20-25. Noted hives allergies listed for amoxicillin and Cipro and unspecified keflex allergy. Started on Aztreonam in the ED - spoke with CCM NP, ok to start meropenem with allergy history and have RN monitor closely.  Plan: D/c aztreonam > Meropenem 1g IV q12h Vancomycin 2g IV x 1; then 1g IV q24h Azithromycin 500mg  IV q24h per CCM Monitor clinical progress, c/s, renal function F/u de-escalation plan/LOT, vancomycin trough as indicated     Temp (24hrs), Avg:98.9 F (37.2 C), Min:98.9 F (37.2 C), Max:98.9 F (37.2 C)  Recent Labs  Lab 10/04/17 0958 10/04/17 1008  WBC 10.5  --   CREATININE 2.32*  --   LATICACIDVEN  --  1.71    CrCl cannot be calculated (Unknown ideal weight.).    Allergies  Allergen Reactions  . Iodine Hives    ANTISEPTICS & DISINFECTANTS Throat swelling  . Sulfa Antibiotics     Vomiting and diarrhea  . Amoxicillin Hives  . Ciprofloxacin Hives  . Codeine Other (See Comments) and Hives    Weird dreams  . Keflex  [Cephalexin]   . Penicillins Hives  . Pravastatin Nausea Only  . Tape     skin red irritated    10/06/17, PharmD, BCPS Clinical Pharmacist 10/04/2017 1:14 PM

## 2017-10-04 NOTE — ED Notes (Signed)
Dr. Vassie Loll w/ CCM paged to Dr. Lynelle Doctor @ [phone].

## 2017-10-04 NOTE — Procedures (Signed)
Arterial Catheter Insertion Procedure Note Tonya Mcgee [id number] 03/21/1941  Procedure: Insertion of Arterial Catheter  Indications: Blood pressure monitoring and Frequent blood sampling  Procedure Details Consent: Unable to obtain consent because of emergent medical necessity. Time Out: Verified patient identification, verified procedure, site/side was marked, verified correct patient position, special equipment/implants available, medications/allergies/relevent history reviewed, required imaging and test results available.  Performed  Maximum sterile technique was used including antiseptics, cap, gloves, gown, hand hygiene, mask and sheet. Skin prep: Chlorhexidine; local anesthetic administered 20 gauge catheter was inserted into right radial artery using the Seldinger technique. ULTRASOUND GUIDANCE USED: NO Evaluation Blood flow good; BP tracing good. Complications: No apparent complications.   Koren Bound 10/04/2017

## 2017-10-04 NOTE — Progress Notes (Addendum)
OVERNIGHT COVERAGE PROGRESS NOTE  Called to see patient regarding increased work of breathing.  Patient is currently on BiPAP 14/8, FiO2 60%.  Respirations 22.  SPO2 92%.  Frequent coughing.  Audible rattle.  Patient in good spirits.  Does not like the BiPAP.  Review of records reveals patient has rheumatoid arthritis and antiphospholipid syndrome.  Not currently on DVT prophylaxis.  Apparently, not on chronic anticoagulation at home either.  Patient indicates she used to be on warfarin.  She decided to discontinue therapy, although it was not for hemorrhagic complications.  On exam, awake, alert and oriented.  There is no acute distress.  Frequent coughing.  Diminished air entry.  Coarse rales and rhonchi.  2D echo (4/24, 1355): LVEF 65-70%.  Normal wall motion.  Grade 1 diastolic dysfunction.  Chest x-ray: Bilateral airspace disease, most notably with right middle lobe infiltrate and early consolidation.  Active Problems:   Community acquired pneumonia   Antiphospholipid syndrome (HCC)   Rheumatoid arthritis (HCC)  Stop BiPAP. Try heated high flow cannula.  I did speak to the patient about the potential for endotracheal intubation and mechanical ventilatory support.  She is agreeable, if needed. Albuterol q 4 hr PRN for wheezing. Budesonide 0.5 mg neb q 12. Pulmonary toilet.  Encourage coughing.  Incentive spirometry. Stop diuretics. Continue meropenem/vancomycin. Obtain sputum for Gram stain, culture/sensitivity. Start heparin gtt. I have advised the patient to allow anticoagulation during her hospitalization.   She is a former smoker without history of COPD/emphysema.  May need to consider bronchodilator and inhaled corticosteroid therapy. Eventually will need chest CT evaluation of lung parenchyma (possible rheumatoid arthritis associated interstitial lung disease).  CODE STATUS: Full code.    Marcelle Smiling, MD Board Certified by the ABIM, Pulmonary Diseases & Critical Care  Medicine  Critical care time: 30 minutes. The treatment and management of the patient's condition was required based on the threat of imminent deterioration. This time reflects time spent by the physician evaluating, providing care and managing the critically ill patient's care. The time was spent at the immediate bedside (or on the same floor/unit and dedicated to this patient's care). Time involved in separately billable procedures is NOT included int he critical care time indicated above. Family meeting and update time may be included above if and only if the patient is unable/incompetent to participate in clinical interview and/or decision making, and the discussion was necessary to determining treatment decisions.  Marcelle Smiling, MD

## 2017-10-04 NOTE — ED Notes (Signed)
Bedside echo at bedside.

## 2017-10-04 NOTE — ED Notes (Signed)
MD aware of bp 89/40.

## 2017-10-05 ENCOUNTER — Telehealth: Payer: Self-pay | Admitting: Family Medicine

## 2017-10-05 ENCOUNTER — Other Ambulatory Visit: Payer: Self-pay

## 2017-10-05 DIAGNOSIS — I509 Heart failure, unspecified: Secondary | ICD-10-CM

## 2017-10-05 DIAGNOSIS — R7881 Bacteremia: Secondary | ICD-10-CM

## 2017-10-05 DIAGNOSIS — R0602 Shortness of breath: Secondary | ICD-10-CM

## 2017-10-05 DIAGNOSIS — J189 Pneumonia, unspecified organism: Secondary | ICD-10-CM

## 2017-10-05 DIAGNOSIS — N179 Acute kidney failure, unspecified: Secondary | ICD-10-CM

## 2017-10-05 DIAGNOSIS — R918 Other nonspecific abnormal finding of lung field: Secondary | ICD-10-CM

## 2017-10-05 LAB — MRSA PCR SCREENING: MRSA BY PCR: NEGATIVE

## 2017-10-05 LAB — BASIC METABOLIC PANEL
ANION GAP: 15 (ref 5–15)
ANION GAP: 16 — AB (ref 5–15)
BUN: 63 mg/dL — ABNORMAL HIGH (ref 6–20)
BUN: 65 mg/dL — ABNORMAL HIGH (ref 6–20)
CALCIUM: 8.1 mg/dL — AB (ref 8.9–10.3)
CALCIUM: 8.1 mg/dL — AB (ref 8.9–10.3)
CO2: 16 mmol/L — ABNORMAL LOW (ref 22–32)
CO2: 16 mmol/L — AB (ref 22–32)
CREATININE: 2.32 mg/dL — AB (ref 0.44–1.00)
Chloride: 104 mmol/L (ref 101–111)
Chloride: 104 mmol/L (ref 101–111)
Creatinine, Ser: 2.41 mg/dL — ABNORMAL HIGH (ref 0.44–1.00)
GFR, EST AFRICAN AMERICAN: 21 mL/min — AB (ref >60)
GFR, EST AFRICAN AMERICAN: 22 mL/min — AB (ref >60)
GFR, EST NON AFRICAN AMERICAN: 18 mL/min — AB (ref >60)
GFR, EST NON AFRICAN AMERICAN: 19 mL/min — AB (ref >60)
GLUCOSE: 63 mg/dL — AB (ref 65–99)
Glucose, Bld: 102 mg/dL — ABNORMAL HIGH (ref 65–99)
POTASSIUM: 5.9 mmol/L — AB (ref 3.5–5.1)
Potassium: 5.1 mmol/L (ref 3.5–5.1)
SODIUM: 135 mmol/L (ref 135–145)
SODIUM: 136 mmol/L (ref 135–145)

## 2017-10-05 LAB — POCT I-STAT 3, ART BLOOD GAS (G3+)
ACID-BASE DEFICIT: 9 mmol/L — AB (ref 0.0–2.0)
Acid-base deficit: 8 mmol/L — ABNORMAL HIGH (ref 0.0–2.0)
Acid-base deficit: 9 mmol/L — ABNORMAL HIGH (ref 0.0–2.0)
BICARBONATE: 16.3 mmol/L — AB (ref 20.0–28.0)
BICARBONATE: 15.6 mmol/L — AB (ref 20.0–28.0)
Bicarbonate: 17.4 mmol/L — ABNORMAL LOW (ref 20.0–28.0)
O2 SAT: 97 %
O2 Saturation: 98 %
O2 Saturation: 96 %
PCO2 ART: 34.7 mmHg (ref 32.0–48.0)
PO2 ART: 98 mmHg (ref 83.0–108.0)
PO2 ART: 86 mmHg (ref 83.0–108.0)
Patient temperature: 98.7
TCO2: 18 mmol/L — AB (ref 22–32)
TCO2: 17 mmol/L — ABNORMAL LOW (ref 22–32)
TCO2: 17 mmol/L — ABNORMAL LOW (ref 22–32)
pCO2 arterial: 31.6 mmHg — ABNORMAL LOW (ref 32.0–48.0)
pCO2 arterial: 29.1 mmHg — ABNORMAL LOW (ref 32.0–48.0)
pH, Arterial: 7.308 — ABNORMAL LOW (ref 7.350–7.450)
pH, Arterial: 7.322 — ABNORMAL LOW (ref 7.350–7.450)
pH, Arterial: 7.339 — ABNORMAL LOW (ref 7.350–7.450)
pO2, Arterial: 105 mmHg (ref 83.0–108.0)

## 2017-10-05 LAB — BLOOD CULTURE ID PANEL (REFLEXED)
Acinetobacter baumannii: NOT DETECTED
CANDIDA GLABRATA: NOT DETECTED
CANDIDA KRUSEI: NOT DETECTED
CANDIDA PARAPSILOSIS: NOT DETECTED
Candida albicans: NOT DETECTED
Candida tropicalis: NOT DETECTED
ENTEROBACTER CLOACAE COMPLEX: NOT DETECTED
ESCHERICHIA COLI: NOT DETECTED
Enterobacteriaceae species: NOT DETECTED
Enterococcus species: NOT DETECTED
Haemophilus influenzae: DETECTED — AB
KLEBSIELLA OXYTOCA: NOT DETECTED
Klebsiella pneumoniae: NOT DETECTED
LISTERIA MONOCYTOGENES: NOT DETECTED
Neisseria meningitidis: NOT DETECTED
PROTEUS SPECIES: NOT DETECTED
Pseudomonas aeruginosa: NOT DETECTED
SERRATIA MARCESCENS: NOT DETECTED
STAPHYLOCOCCUS AUREUS BCID: NOT DETECTED
STAPHYLOCOCCUS SPECIES: NOT DETECTED
STREPTOCOCCUS PNEUMONIAE: NOT DETECTED
STREPTOCOCCUS PYOGENES: NOT DETECTED
Streptococcus agalactiae: NOT DETECTED
Streptococcus species: NOT DETECTED

## 2017-10-05 LAB — GLUCOSE, CAPILLARY
Glucose-Capillary: 67 mg/dL (ref 65–99)
Glucose-Capillary: 93 mg/dL (ref 65–99)
Glucose-Capillary: 108 mg/dL — ABNORMAL HIGH (ref 65–99)
Glucose-Capillary: 117 mg/dL — ABNORMAL HIGH (ref 65–99)

## 2017-10-05 LAB — URINE CULTURE: CULTURE: NO GROWTH

## 2017-10-05 LAB — CBC
HCT: 25.9 % — ABNORMAL LOW (ref 36.0–46.0)
HEMOGLOBIN: 8.5 g/dL — AB (ref 12.0–15.0)
MCH: 31.7 pg (ref 26.0–34.0)
MCHC: 32.8 g/dL (ref 30.0–36.0)
MCV: 96.6 fL (ref 78.0–100.0)
PLATELETS: 305 10*3/uL (ref 150–400)
RBC: 2.68 MIL/uL — AB (ref 3.87–5.11)
RDW: 14.6 % (ref 11.5–15.5)
WBC: 23.8 10*3/uL — ABNORMAL HIGH (ref 4.0–10.5)

## 2017-10-05 LAB — PROCALCITONIN: Procalcitonin: 23.83 ng/mL

## 2017-10-05 LAB — HEPARIN LEVEL (UNFRACTIONATED)
Heparin Unfractionated: 0.25 IU/mL — ABNORMAL LOW (ref 0.30–0.70)
Heparin Unfractionated: 0.2 IU/mL — ABNORMAL LOW (ref 0.30–0.70)

## 2017-10-05 LAB — MAGNESIUM: Magnesium: 2.3 mg/dL (ref 1.7–2.4)

## 2017-10-05 MED ORDER — STERILE WATER FOR INJECTION IV SOLN
INTRAVENOUS | Status: DC
  Administered 2017-10-05 – 2017-10-06 (×2): via INTRAVENOUS
  Filled 2017-10-05 (×3): qty 850

## 2017-10-05 MED ORDER — DILTIAZEM HCL 25 MG/5ML IV SOLN
10.0000 mg | Freq: Once | INTRAVENOUS | Status: AC
  Administered 2017-10-05: 10 mg via INTRAVENOUS
  Filled 2017-10-05: qty 5

## 2017-10-05 MED ORDER — HEPARIN BOLUS VIA INFUSION
1500.0000 [IU] | Freq: Once | INTRAVENOUS | Status: AC
  Administered 2017-10-05: 1500 [IU] via INTRAVENOUS
  Filled 2017-10-05: qty 1500

## 2017-10-05 MED ORDER — DILTIAZEM HCL 60 MG PO TABS: 30.0000 mg | ORAL_TABLET | Freq: Three times a day (TID) | ORAL | Status: DC

## 2017-10-05 MED ORDER — METHYLPREDNISOLONE SODIUM SUCC 40 MG IJ SOLR
40.0000 mg | Freq: Two times a day (BID) | INTRAMUSCULAR | Status: DC
  Administered 2017-10-05 – 2017-10-08 (×6): 40 mg via INTRAVENOUS
  Filled 2017-10-05 (×6): qty 1

## 2017-10-05 MED ORDER — ESMOLOL HCL-SODIUM CHLORIDE 2000 MG/100ML IV SOLN
25.0000 ug/kg/min | INTRAVENOUS | Status: DC
  Administered 2017-10-05 (×2): 25 ug/kg/min via INTRAVENOUS
  Filled 2017-10-05 (×2): qty 100

## 2017-10-05 MED ORDER — SODIUM CHLORIDE 0.9 % IV BOLUS: 1000.0000 mL | Freq: Once | INTRAVENOUS | Status: DC

## 2017-10-05 MED ORDER — SODIUM CHLORIDE 0.9 % IV SOLN
1.0000 g | Freq: Three times a day (TID) | INTRAVENOUS | Status: DC
  Administered 2017-10-05 – 2017-10-15 (×30): 1 g via INTRAVENOUS
  Filled 2017-10-05 (×32): qty 1

## 2017-10-05 MED ORDER — ONDANSETRON HCL 4 MG/2ML IJ SOLN
4.0000 mg | Freq: Three times a day (TID) | INTRAMUSCULAR | Status: DC | PRN
  Administered 2017-10-05 – 2017-10-09 (×5): 4 mg via INTRAVENOUS
  Filled 2017-10-05 (×5): qty 2

## 2017-10-05 MED ORDER — HEPARIN BOLUS VIA INFUSION
1000.0000 [IU] | Freq: Once | INTRAVENOUS | Status: AC
  Administered 2017-10-05: 1000 [IU] via INTRAVENOUS
  Filled 2017-10-05: qty 1000

## 2017-10-05 MED ORDER — DEXTROSE-NACL 5-0.45 % IV SOLN
INTRAVENOUS | Status: DC
  Administered 2017-10-05 – 2017-10-06 (×3): via INTRAVENOUS

## 2017-10-05 MED ORDER — HYDROXYCHLOROQUINE SULFATE 200 MG PO TABS
400.0000 mg | ORAL_TABLET | Freq: Every day | ORAL | Status: DC
  Administered 2017-10-05 – 2017-10-12 (×7): 400 mg via ORAL
  Filled 2017-10-05 (×9): qty 2

## 2017-10-05 NOTE — Progress Notes (Signed)
    Called by RN as patient developed elevated HR and worsening respiratory distress. Now back on Bipap, suspect she may need intubation but she wants to wait for her family to arrive. In review of telemetry with DOD, she appears to be transitioning between Afib and Aflutter with rates in the 130-160s. Given a Dilt 20mg  bolus by PCCM but blood pressures became soft. Initial thought was to give amiodarone gtt but patient with iodine allergy, throat swelling. Hesitant to give at this time given allergy though sounds more like a contrast allergy in talking with the patient. Reviewed patient with EP and suggested esmolol gtt instead given her allergy. PCCM MD notes study favoring esmolol in septic patients. Will start low dose esmolol gtt and monitor blood pressures. Getting IVFs as well. Last BP 99 systolic. Have added patient back to rounding list as well.   Signed , NP-C 10/05/2017, 12:17 PM Pager: [phone]

## 2017-10-05 NOTE — Progress Notes (Signed)
ANTICOAGULATION CONSULT NOTE   Pharmacy Consult for heparin drip Indication: Antiphospholipid antibody syndrome  Allergies  Allergen Reactions  . Iodine Hives    ANTISEPTICS & DISINFECTANTS Throat swelling  . Sulfa Antibiotics     Vomiting and diarrhea  . Amoxicillin Hives  . Ciprofloxacin Hives  . Codeine Other (See Comments) and Hives    Weird dreams  . Keflex  [Cephalexin]   . Penicillins Hives  . Pravastatin Nausea Only  . Tape     skin red irritated    Patient Measurements: Height: 5\' 2"  (157.5 cm) Weight: 209 lb 7 oz (95 kg) IBW/kg (Calculated) : 50.1 Heparin Dosing Weight: 72.3kg   Vital Signs: Temp: 97.9 F (36.6 C) (04/25 2022) Temp Source: Axillary (04/25 2022) BP: 101/69 (04/25 2100) Pulse Rate: 79 (04/25 2100)  Labs: Recent Labs    10/04/17 0958 10/04/17 1408 10/05/17 0534 10/05/17 0710 10/05/17 0823 10/05/17 1100 10/05/17 2042  HGB 8.8*  --  8.5*  --   --   --   --   HCT 26.5*  --  25.9*  --   --   --   --   PLT 294  --  305  --   --   --   --   APTT  --  22*  --   --   --   --   --   LABPROT  --  14.5  --   --   --   --   --   INR  --  1.14  --   --   --   --   --   HEPARINUNFRC  --   --   --   --  0.25*  --  0.20*  CREATININE 2.32*  --   --  2.41*  --  2.32*  --   TROPONINI  --  0.05*  --   --   --   --   --     Estimated Creatinine Clearance: 21.5 mL/min (A) (by C-G formula based on SCr of 2.32 mg/dL (H)).   Medical History: Past Medical History:  Diagnosis Date  . History of chicken pox   . History of measles   . History of mumps      Assessment: Tonya Mcgee is a 77 y.o. female admitted on 10/04/2017 with sepsis, PNA. Patient has a history of positive antiphospholipid antibody syndrome. Patient does not appear to be taking any anticoagulation for this PTA, but was apparently on warfarin in the past which she stop taking for non-hemorrhagic reasons.  Heparin level this evening is subtherapeutic at 0.2. No issues with heparin  drip per RN Hemoglobin and platelets are stable. No concerns for bleeding at this time.   Goal of Therapy:  Heparin level 0.3-0.7 units/ml Monitor platelets by anticoagulation protocol: Yes   Plan:  Heparin bolus 1500 units x 1  Increase heparin infusion to 1650 units  HL in 8 hours Daily CBC and heparin level  Tonya Mcgee A. 10/06/2017, PharmD, BCPS Clinical Pharmacist [place] Pager: [phone]  10/05/2017 9:51 PM

## 2017-10-05 NOTE — Progress Notes (Signed)
Pt was agitated and very anxious at beginning of shift 1900 complaining of hip pain, RR 30's to 40's, HR 120's, contacted [facility] and they order pain meds, gave pain meds.  MD's came to assess pt and took pt off bipap and on high flow Adrian.  A couple of hours ltr pt was still very restless, anxious and agitated with some unclear speech.  Contact [facility] again and sent MD over to look at pt and recommended back on bipap with ABG and continue to  Watch pt.

## 2017-10-05 NOTE — Telephone Encounter (Signed)
'  s husband called saying his wife was in the hospital and he would like to know which antibiotic Dr. Sherrie Mustache prescribed for her last.  His call back is [phone]  Thanks teri

## 2017-10-05 NOTE — Progress Notes (Signed)
ANTICOAGULATION CONSULT NOTE   Pharmacy Consult for heparin drip Indication: Antiphospholipid antibody syndrome  Allergies  Allergen Reactions  . Iodine Hives    ANTISEPTICS & DISINFECTANTS Throat swelling  . Sulfa Antibiotics     Vomiting and diarrhea  . Amoxicillin Hives  . Ciprofloxacin Hives  . Codeine Other (See Comments) and Hives    Weird dreams  . Keflex  [Cephalexin]   . Penicillins Hives  . Pravastatin Nausea Only  . Tape     skin red irritated    Patient Measurements:   Heparin Dosing Weight: 72.3kg   Vital Signs: Temp: 96.9 F (36.1 C) (04/25 0803) Temp Source: Axillary (04/25 0803) BP: 106/79 (04/25 0800) Pulse Rate: 114 (04/25 0800)  Labs: Recent Labs    10/04/17 0958 10/04/17 1408 10/05/17 0534 10/05/17 0710 10/05/17 0823  HGB 8.8*  --  8.5*  --   --   HCT 26.5*  --  25.9*  --   --   PLT 294  --  305  --   --   APTT  --  22*  --   --   --   LABPROT  --  14.5  --   --   --   INR  --  1.14  --   --   --   HEPARINUNFRC  --   --   --   --  0.25*  CREATININE 2.32*  --   --  2.41*  --   TROPONINI  --  0.05*  --   --   --     CrCl cannot be calculated (Unknown ideal weight.).   Medical History: Past Medical History:  Diagnosis Date  . History of chicken pox   . History of measles   . History of mumps      Assessment: Tonya Mcgee is a 77 y.o. female admitted on 10/04/2017 with sepsis, PNA. Patient has a history of positive antiphospholipid antibody syndrome. Patient does not appear to be taking any anticoagulation for this PTA, but was apparently on warfarin in the past which she stop taking for non-hemorrhagic reasons.  Heparin level this morning subtherapeutic at 0.25. Hemoglobin and platelets are stable. No concerns for bleeding at this time.   Goal of Therapy:  Heparin level 0.3-0.7 units/ml Monitor platelets by anticoagulation protocol: Yes   Plan:  Heparin bolus 1000 units x 1  Increase heparin infusion to 1450 units  HL in 8  hours Daily CBC and heparin level  Blake Divine, Pharm.D. PGY1 Pharmacy Resident 10/05/2017 9:23 AM Main Pharmacy: [phone]

## 2017-10-05 NOTE — Progress Notes (Signed)
Patient followed up in Afternoon. Looks better still on BIPAP. ABG was ok and K improved to 5.1 and cr is coming down. HR is better with Esmolol and patient settling down.  As patient is very borderline with hypoxia off bipap, Extensive PNA on right and afib spoke at length AGAIN with patient and family about intubation and placing on mechanical ventilator. Patient adamantly refused it again she is not suggesting DNR or DNI but she only wish to do in case of only emergency and worsening condition or bipap failure.  Plan of care discussed with nursing Will closely monitor in ICU  Abas Leicht [facility] Pulmonary Critical Care & Sleep Medicine

## 2017-10-05 NOTE — Progress Notes (Signed)
PHARMACY - PHYSICIAN COMMUNICATION CRITICAL VALUE ALERT - BLOOD CULTURE IDENTIFICATION (BCID)  Tonya Mcgee is an 77 y.o. female who presented to [facility] on 10/04/2017 with a chief complaint of respiratory distress  Assessment:  76 YOF on antibiotics for CAP and now with 1 of 4 blood cultures with gram stain corrected do show gram negative coccobacilli and BCID detecting Haemophilus influenzae  Name of physician (or Provider) Contacted: CCM  Current antibiotics: Azithromycin + Vancomycin + Meropenem  Changes to prescribed antibiotics recommended: The patient has limited options due to allergies (hives with penicillins, unspecified with keflex - discussed with patient/son this admission and still cannot recall reaction, hives with ciprofloxacin). Would recommend to d/c Vancomycin and Azithromycin and narrow Meropenem to Aztreonam.  Results for orders placed or performed during the hospital encounter of 10/04/17  Blood Culture ID Panel (Reflexed) (Collected: 10/04/2017 10:31 AM)  Result Value Ref Range   Enterococcus species NOT DETECTED NOT DETECTED   Listeria monocytogenes NOT DETECTED NOT DETECTED   Staphylococcus species NOT DETECTED NOT DETECTED   Staphylococcus aureus NOT DETECTED NOT DETECTED   Streptococcus species NOT DETECTED NOT DETECTED   Streptococcus agalactiae NOT DETECTED NOT DETECTED   Streptococcus pneumoniae NOT DETECTED NOT DETECTED   Streptococcus pyogenes NOT DETECTED NOT DETECTED   Acinetobacter baumannii NOT DETECTED NOT DETECTED   Enterobacteriaceae species NOT DETECTED NOT DETECTED   Enterobacter cloacae complex NOT DETECTED NOT DETECTED   Escherichia coli NOT DETECTED NOT DETECTED   Klebsiella oxytoca NOT DETECTED NOT DETECTED   Klebsiella pneumoniae NOT DETECTED NOT DETECTED   Proteus species NOT DETECTED NOT DETECTED   Serratia marcescens NOT DETECTED NOT DETECTED   Haemophilus influenzae DETECTED (A) NOT DETECTED   Neisseria meningitidis NOT DETECTED  NOT DETECTED   Pseudomonas aeruginosa NOT DETECTED NOT DETECTED   Candida albicans NOT DETECTED NOT DETECTED   Candida glabrata NOT DETECTED NOT DETECTED   Candida krusei NOT DETECTED NOT DETECTED   Candida parapsilosis NOT DETECTED NOT DETECTED   Candida tropicalis NOT DETECTED NOT DETECTED    Tonya Mcgee 10/05/2017  9:24 AM

## 2017-10-05 NOTE — Telephone Encounter (Signed)
Returned Russ's call and advised.

## 2017-10-05 NOTE — Progress Notes (Signed)
Patient still very tachycardic. Cardiology followed up plan to start Amiodarone for possible Afib Aflutter. She is on BIPAP and sats are in 90s Spoke at length for intubation as I think she needs to be intubated due to her respiratory status and heart rate with sepsis and pneumonia Patient does not wish to be intubated till her husband comes here. Spoke with husband and son at length over phone, Husband suggested to respect patient wishes and will be there this afternoon and if she is still not doing well will consider intubation at that point. Patient and family does understand delaying intubation might result in cardiac arrest or worsening respiratory failure. Will respect patient wishes  Tonya Mcgee Pulmonary Critical Care & Sleep Medicine

## 2017-10-05 NOTE — Progress Notes (Signed)
PULMONARY / CRITICAL CARE MEDICINE   Name: Tonya Mcgee MRN: [id number] DOB: 01-20-1941    ADMISSION DATE:  10/04/2017  REFERRING MD:  Lynelle Doctor (EDP)   CHIEF COMPLAINT:  Respiratory distress   HISTORY OF PRESENT ILLNESS:   77 year old female with minimal past medical history other than hypertension resented 4/24 with respiratory distress.  According to her husband she has had 1 day history of shortness of breath, congestion, orthopnea.  She got out of bed last night to go sleep in the recliner due to orthopnea and when husband awoke this morning she was minimally responsive.  EMS placed her on BiPAP.  In ER she has had a labile blood pressure with SBP 60s at times.  Normal WBC, afebrile, BNP 417, normal lactate.  She remains on BiPAP, PCCM consulted for ICU admission.  SUBJECTIVE: Overnight little bit restless, But more awake and comfortable on high flow. K is 5.9 and Cr went up to 1.4, Tmax was 100.5 overnight, ABG ok but still on bicarb drip. Early am low blood sugars in 60s noted., WBC is 23 and Blood culture is positive for H influenza A line was removed in am Overnight attending started on Heparin Drip due to h/o multiple blood clot and antiphospholipid syndrome  VITAL SIGNS: BP (!) 129/58   Pulse (!) 107   Temp (!) 96.9 F (36.1 C) (Axillary)   Resp (!) 30   Ht 5\' 2"  (1.575 m)   Wt 95 kg (209 lb 7 oz)   SpO2 96%   BMI 38.31 kg/m   HEMODYNAMICS: CVP:  [7 mmHg-17 mmHg] 13 mmHg  VENTILATOR SETTINGS: FiO2 (%):  [36 %-100 %] 100 %  INTAKE / OUTPUT: I/O last 3 completed shifts: In: 1561.2 [I.V.:111.2; IV Piggyback:1450] Out: 1075 [Urine:1075]  PHYSICAL EXAMINATION: General:  Chronically ill appearing female, looks comfortable in am Neuro:More awake and moving all ext to command HEENT:  [place]/AT, PERRL, EOM-I and MMM Cardiovascular:  RRR, Nl S1/S2 and -M/R/G Lungs:  Diffuse crackles R>L Abdomen:  Soft, distended, NT and +BS Musculoskeletal:  2+ edema and -tenderness Skin:   Intact but thin  LABS:  BMET Recent Labs  Lab 10/04/17 0958 10/05/17 0710  NA 129* 135  K 4.9 5.9*  CL 97* 104  CO2 19* 16*  BUN 53* 63*  CREATININE 2.32* 2.41*  GLUCOSE 82 63*    Electrolytes Recent Labs  Lab 10/04/17 0958 10/05/17 0710  CALCIUM 8.5* 8.1*    CBC Recent Labs  Lab 10/04/17 0958 10/05/17 0534  WBC 10.5 23.8*  HGB 8.8* 8.5*  HCT 26.5* 25.9*  PLT 294 305    Coag's Recent Labs  Lab 10/04/17 1408  APTT 22*  INR 1.14    Sepsis Markers Recent Labs  Lab 10/04/17 1008 10/04/17 1408 10/04/17 1704 10/05/17 0710  LATICACIDVEN 1.71  --  1.3  --   PROCALCITON  --  15.94  --  23.83    ABG Recent Labs  Lab 10/04/17 1038 10/05/17 0108 10/05/17 0346  PHART 7.365 7.308* 7.322*  PCO2ART 29.8* 34.7 31.6*  PO2ART 94.0 98.0 105.0    Liver Enzymes No results for input(s): AST, ALT, ALKPHOS, BILITOT, ALBUMIN in the last 168 hours.  Cardiac Enzymes Recent Labs  Lab 10/04/17 1408  TROPONINI 0.05*    Glucose Recent Labs  Lab 10/05/17 0910  GLUCAP 67    Imaging 10/07/17 Renal  Result Date: 10/04/2017 CLINICAL DATA:  Renal failure EXAM: RENAL / URINARY TRACT ULTRASOUND COMPLETE COMPARISON:  None. FINDINGS: Right Kidney: Length:  9.7 cm. Echogenicity within normal limits. No mass or hydronephrosis visualized. Left Kidney: Length: 9.2 cm. Areas of cortical thinning. Normal echotexture. No hydronephrosis or mass. Bladder: Appears normal for degree of bladder distention. IMPRESSION: No acute findings.  No hydronephrosis. Electronically Signed   By: Charlett Nose M.D.   On: 10/04/2017 18:09   Dg Chest Portable 1 View  Result Date: 10/04/2017 CLINICAL DATA:  Status post central line placement today. EXAM: PORTABLE CHEST 1 VIEW COMPARISON:  Single view of the chest 10/04/2017. FINDINGS: Right IJ catheter is in place with the tip projecting in the mid to lower superior vena cava. No pneumothorax. Right much worse than left airspace disease is unchanged.  Heart size is upper normal. IMPRESSION: Right IJ catheter projects in the mid to lower superior vena cava. No pneumothorax. No change in right much worse than left airspace disease most consistent with multifocal pneumonia. Electronically Signed   By: Drusilla Kanner M.D.   On: 10/04/2017 13:19     STUDIES:  2D echo 4/24>>> EF 70%    CULTURES: BC x2 4/24>>> H influenza  Sputum 4/24>>> Urine 4/24>>>  ANTIBIOTICS: Meropenem 4/24>>>Stopped 4/25 Azithro 4/24>>> vanc 4/24>>>Stopped 4/25 Azactam started on 4/25   SIGNIFICANT EVENTS: Overnight some distress now off bipap   LINES/TUBES: R rad aline 4/24>>> Removed on 4/25 R IJ CVL 4/24>>>   DISCUSSION: 77 year old female with questionable PMH of CHF presenting to the ED with respiratory failure, hypoxemia, BiPAP and septic shock from a RLL PNA.  On exam, diffuse crackles R>L.  I reviewed CXR myself, diffuse pulmonary edema and RLL infiltrate.  Discussed with PCCM-NP.  ASSESSMENT / PLAN:  PULMONARY A: Acute on chronic respiratory failure from CHF and CAP P:   - BiPAP PRN on high flow  - If further deterioration then intubated - Titrate O2 for sat 88-92% -  - Abx as below - BP is better  - HR is going up monitor on albuterol - Add budesonide  CARDIOVASCULAR A:  Septic shock Agree with cardiology clinical picture is of sepsis Patient HR is going up reported PAC by cardiology but going as high as 140s to 180s  P:  - Echo followed up - Add cardizem - Continue heparin now <more so for anticoagulation related to antiphospholipid if doing ok change to lovenox or NOVA> - CVP q shift - Abx as below  RENAL A:   Acute renal failure- Worsening noted- Check bladder scan if >200 consider foley catheter P:   - Start iv fluids - DC lasix - On bicarb drip ? Due to acidosis but Anion gap is normal, No diarrhoea per nursing  - Monitor K, Recheck in afternoon if continue to remain elevated consider insulin/kayexalate - BMET in  AM - Replace electrolytes as indicated - Renal consult if needed  GASTROINTESTINAL A:   No active issues P:   - Start diet  - Protonix  HEMATOLOGIC A:   Leukocytosis P:  - See ID section - Transfuse per ICU protocol - CBC in AM  INFECTIOUS A:   CAP P:   - Azactam 14 days from negative culture< Allergy to cipro> - Zithromax - RPT culture in am to see clearing - PCT protocol  ENDOCRINE A:   Hypothyroid   P:   - One half dose of home PO thyroxin - TSH is ok  NEUROLOGIC A:   AMS due to sepsis P:   RASS goal: N/A - Hold sedating medications - Monitor clinically   Skin/Wound:  chronic changes   Electrolytes: Replace electrolytes per ICU electrolyte replacement protocol.   IVF: none  Nutrition: Diet as tolerated   Prophylaxis: DVT Prophylaxis with heparin,. GI Prophylaxis.   Restraints: None  PT/OT eval and treat. OOB when appropriate.   Lines/Tubes:  Puravic foley  R CVC 10/04/17 central line.  ADVANCE DIRECTIVE:Full code  FAMILY DISCUSSION:Spoke with patient and son  Quality Care: PPI, DVT prophylaxis, HOB elevated, Infection control all reviewed and addressed.  Events and notes from last 24 hours reviewed. Care plan discussed on multidisciplinary rounds  CC TIME:40 min    Old records reviewed discussed results and management plan with patient  Images personally reviewed and results and labs reviewed and discussed with patient.  All medication reviewed and adjusted  Further management depending on test results and work up as outlined above.    Roseanne Reno, M.D  Addendum: Later on patiente was found to be tachycardic cardizem iv and po ordered patient continue remain tachypnic and started becoming hypoxic on high flow so placed back on BIPAP. Spoke about intubation but she wanted to see how she does on bipap before deciding on that.   RPT abg in 1 hr  Rumeal Cullipher [facility] Pulmonary Critical Care & Sleep Medicine

## 2017-10-05 NOTE — Progress Notes (Signed)
Pt's HR sustaining 170s-190s, CCM MD at bedside. 12 lead EKG done, 20 mg IV Cardizem once ordered and given without relief. Cardiology paged and coming to bedside. BP 80s/40s. Pt placed back on Bipap due to desaturations.

## 2017-10-05 NOTE — Progress Notes (Addendum)
Pharmacy Antibiotic Note  Tonya Mcgee is a 77 y.o. female admitted on 10/04/2017 with sepsis due to PNA.   Pharmacy has been consulted for aztreonam dosing. Patient previously on vancomycin, meropenem and azithromycin.  BCID identifying H. Influenzae. Given multiple allergies, will narrow to Aztreonam while continuing azithromycin.  WBC 23.8, febrile to 100.5, Scr 2.41, CXR extensive consolidation.  Plan: Aztreonam 1g IV every 8 hours  Continue azithromycin 500mg  every 24 hours.  Monitor for Scr, c/s, clinical resolution. F/u de-escalation plan/LOT   Height: 5\' 2"  (157.5 cm) Weight: 209 lb 7 oz (95 kg) IBW/kg (Calculated) : 50.1  Temp (24hrs), Avg:98.7 F (37.1 C), Min:96.9 F (36.1 C), Max:100.5 F (38.1 C)  Recent Labs  Lab 10/04/17 0958 10/04/17 1008 10/04/17 1704 10/05/17 0534 10/05/17 0710  WBC 10.5  --   --  23.8*  --   CREATININE 2.32*  --   --   --  2.41*  LATICACIDVEN  --  1.71 1.3  --   --     Estimated Creatinine Clearance: 20.7 mL/min (A) (by C-G formula based on SCr of 2.41 mg/dL (H)).    Allergies  Allergen Reactions  . Iodine Hives    ANTISEPTICS & DISINFECTANTS Throat swelling  . Sulfa Antibiotics     Vomiting and diarrhea  . Amoxicillin Hives  . Ciprofloxacin Hives  . Codeine Other (See Comments) and Hives    Weird dreams  . Keflex  [Cephalexin]   . Penicillins Hives  . Pravastatin Nausea Only  . Tape     skin red irritated    Antimicrobials this admission: vancomycin 4/24 >> 4/25 Meropenem 4/24 >> 4/25 Azithromycin 4/24> Aztreonam 4/24 one dose, 4/25>>    Microbiology results: 4/24 BCx: H influenzae 4/24 UCx: IP 4/25 MRSA PCR: negative  Thank you for allowing pharmacy to be a part of this patient's care.  5/24, PharmD Candidate 10/05/2017 10:53 AM

## 2017-10-05 NOTE — Progress Notes (Signed)
CCM MD made aware of hypoglycemia - orders received for D5 1/2NS infusion @ 40cc/hr.

## 2017-10-05 NOTE — Progress Notes (Signed)
Progress Note  Patient Name: Tonya Mcgee Date of Encounter: 10/05/2017  Primary Cardiologist: New  Subjective   Complains of discomfort in face from wearing Bipap. Notes a lot of coughing. No chest pain.  Inpatient Medications    Scheduled Meds: . budesonide (PULMICORT) nebulizer solution  0.5 mg Nebulization BID  . levothyroxine  100 mcg Oral QAC breakfast   Continuous Infusions: . azithromycin Stopped (10/04/17 1956)  . heparin 1,300 Units/hr (10/05/17 0600)  . meropenem (MERREM) IV Stopped (10/04/17 2329)  .  sodium bicarbonate (isotonic) infusion in sterile water 50 mL/hr at 10/05/17 0636  . sodium chloride    . vancomycin     PRN Meds: albuterol, fentaNYL (SUBLIMAZE) injection   Vital Signs    Vitals:   10/05/17 0358 10/05/17 0400 10/05/17 0500 10/05/17 0600  BP:  (!) 119/57 128/68 (!) 122/108  Pulse:  (!) 102 97 (!) 103  Resp:  (!) 32 (!) 29 (!) 26  Temp: 97.7 F (36.5 C) 97.7 F (36.5 C)    TempSrc: Oral Oral    SpO2:  100% 90% 98%    Intake/Output Summary (Last 24 hours) at 10/05/2017 0725 Last data filed at 10/05/2017 0600 Gross per 24 hour  Intake 1528.22 ml  Output 1075 ml  Net 453.22 ml   There were no vitals filed for this visit.  Telemetry    NSR/sinus tachycardia with frequent PACs, short bursts of PAT- Personally Reviewed  ECG    none  Physical Exam   GEN: overweight WF on Bipap Neck: No JVD Cardiac: IRRR, no murmurs, rubs, or gallops.  Respiratory: bilateral rales R>L GI: Soft, nontender, non-distended  MS: No edema; No deformity. Neuro:  Nonfocal  Psych: Normal affect   Labs    Chemistry Recent Labs  Lab 10/04/17 0958  NA 129*  K 4.9  CL 97*  CO2 19*  GLUCOSE 82  BUN 53*  CREATININE 2.32*  CALCIUM 8.5*  GFRNONAA 19*  GFRAA 22*  ANIONGAP 13     Hematology Recent Labs  Lab 10/04/17 0958 10/05/17 0534  WBC 10.5 23.8*  RBC 2.74* 2.68*  HGB 8.8* 8.5*  HCT 26.5* 25.9*  MCV 96.7 96.6  MCH 32.1 31.7  MCHC  33.2 32.8  RDW 14.5 14.6  PLT 294 305    Cardiac Enzymes Recent Labs  Lab 10/04/17 1408  TROPONINI 0.05*    Recent Labs  Lab 10/04/17 1006  TROPIPOC 0.04     BNP Recent Labs  Lab 10/04/17 0954  BNP 417.6*     DDimer No results for input(s): DDIMER in the last 168 hours.   Radiology    US Renal  Result Date: 10/04/2017 CLINICAL DATA:  Renal failure EXAM: RENAL / URINARY TRACT ULTRASOUND COMPLETE COMPARISON:  None. FINDINGS: Right Kidney: Length: 9.7 cm. Echogenicity within normal limits. No mass or hydronephrosis visualized. Left Kidney: Length: 9.2 cm. Areas of cortical thinning. Normal echotexture. No hydronephrosis or mass. Bladder: Appears normal for degree of bladder distention. IMPRESSION: No acute findings.  No hydronephrosis. Electronically Signed   By: Charlett Nose M.D.   On: 10/04/2017 18:09   Dg Chest Portable 1 View  Result Date: 10/04/2017 CLINICAL DATA:  Status post central line placement today. EXAM: PORTABLE CHEST 1 VIEW COMPARISON:  Single view of the chest 10/04/2017. FINDINGS: Right IJ catheter is in place with the tip projecting in the mid to lower superior vena cava. No pneumothorax. Right much worse than left airspace disease is unchanged. Heart size is upper normal.  IMPRESSION: Right IJ catheter projects in the mid to lower superior vena cava. No pneumothorax. No change in right much worse than left airspace disease most consistent with multifocal pneumonia. Electronically Signed   By: Drusilla Kanner M.D.   On: 10/04/2017 13:19   Dg Chest Port 1 View  Result Date: 10/04/2017 CLINICAL DATA:  Shortness of breath EXAM: PORTABLE CHEST 1 VIEW COMPARISON:  July 11, 2016 FINDINGS: There is extensive airspace consolidation throughout portions of the right upper lobe and right middle lobe. There is fibrosis in both lung bases. There is no edema or consolidation on the left. Heart size and pulmonary vascular normal. No adenopathy. There is aortic  atherosclerosis. No evident bone lesions. IMPRESSION: Extensive airspace consolidation on the right involving several segmental regions, consistent with pneumonia. 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 No edema evident. Heart size normal. There is aortic atherosclerosis. No evident adenopathy. Aortic Atherosclerosis (ICD10-I70.0). Electronically Signed   By: Bretta Bang III M.D.   On: 10/04/2017 10:42    Cardiac Studies   Echo 10/04/17: Study Conclusions  - Left ventricle: The cavity size was normal. Systolic function was   vigorous. The estimated ejection fraction was in the range of 65%   to 70%. Wall motion was normal; there were no regional wall   motion abnormalities. Doppler parameters are consistent with   abnormal left ventricular relaxation (grade 1 diastolic   dysfunction). Doppler parameters are consistent with high   ventricular filling pressure. - Aortic valve: Valve area (VTI): 2.57 cm^2. Valve area (Vmax):   2.64 cm^2. Valve area (Vmean): 2.35 cm^2. - Mitral valve: Calcified annulus. - Left atrium: The atrium was mildly dilated.  Impressions:  - Vigorous LV systolic function; mild diastolic dysfunction with   elevated LV filling pressure; mildly elevated LVOT velocity of   2.3 m/s likely related to vigorous LV function; mild LAE; trace   MR and TR.   Patient Profile     77 y.o. female with no prior cardiac history presents with acute respiratory failure and pulmonary infiltrates.   Assessment & Plan    1. Acute hypoxic respiratory failure secondary to multilobar PNA. No evidence of CHF. Very vigorous LV function by echo. Elevated BNP due to RV strain with respiratory failure. Continue Bipap support and antibiotics per CCM. 2. ARF 3. Anemia  4. Minimally elevated troponin c/w sepsis syndrome 5. PACs with short runs of PAT- again secondary to acute stress of PNA/sepsis.  6. Bacteremia with gram positive Cocci.   Patient acutely ill with sepsis/PNA. Management per  CCM. Nothing further to add from a cardiac standpoint. Will sign off. Please call with questions.   For questions or updates, please contact [facility] Please consult [email or web address] for contact info under Cardiology/STEMI.      Signed, Saliyah Gillin [place], MD  10/05/2017, 7:25 AM

## 2017-10-06 ENCOUNTER — Inpatient Hospital Stay (HOSPITAL_COMMUNITY): Payer: Medicare Other

## 2017-10-06 DIAGNOSIS — A413 Sepsis due to Hemophilus influenzae: Principal | ICD-10-CM

## 2017-10-06 DIAGNOSIS — I4891 Unspecified atrial fibrillation: Secondary | ICD-10-CM

## 2017-10-06 LAB — GLUCOSE, CAPILLARY
GLUCOSE-CAPILLARY: 146 mg/dL — AB (ref 65–99)
Glucose-Capillary: 164 mg/dL — ABNORMAL HIGH (ref 65–99)
Glucose-Capillary: 150 mg/dL — ABNORMAL HIGH (ref 65–99)
Glucose-Capillary: 148 mg/dL — ABNORMAL HIGH (ref 65–99)

## 2017-10-06 LAB — BASIC METABOLIC PANEL
Anion gap: 13 (ref 5–15)
BUN: 77 mg/dL — AB (ref 6–20)
CALCIUM: 7.3 mg/dL — AB (ref 8.9–10.3)
CO2: 21 mmol/L — ABNORMAL LOW (ref 22–32)
CREATININE: 1.85 mg/dL — AB (ref 0.44–1.00)
Chloride: 104 mmol/L (ref 101–111)
GFR calc Af Amer: 29 mL/min — ABNORMAL LOW (ref >60)
GFR, EST NON AFRICAN AMERICAN: 25 mL/min — AB (ref >60)
GLUCOSE: 145 mg/dL — AB (ref 65–99)
POTASSIUM: 4 mmol/L (ref 3.5–5.1)
Sodium: 138 mmol/L (ref 135–145)

## 2017-10-06 LAB — HEPARIN LEVEL (UNFRACTIONATED): Heparin Unfractionated: 0.4 IU/mL (ref 0.30–0.70)

## 2017-10-06 LAB — POCT I-STAT 3, ART BLOOD GAS (G3+)
ACID-BASE DEFICIT: 6 mmol/L — AB (ref 0.0–2.0)
Bicarbonate: 18.8 mmol/L — ABNORMAL LOW (ref 20.0–28.0)
O2 Saturation: 99 %
PO2 ART: 134 mmHg — AB (ref 83.0–108.0)
Patient temperature: 98.2
TCO2: 20 mmol/L — ABNORMAL LOW (ref 22–32)
pCO2 arterial: 31.6 mmHg — ABNORMAL LOW (ref 32.0–48.0)
pH, Arterial: 7.382 (ref 7.350–7.450)

## 2017-10-06 LAB — PHOSPHORUS: Phosphorus: 5.3 mg/dL — ABNORMAL HIGH (ref 2.5–4.6)

## 2017-10-06 LAB — HEPATIC FUNCTION PANEL
ALK PHOS: 94 U/L (ref 38–126)
ALT: 31 U/L (ref 14–54)
AST: 45 U/L — ABNORMAL HIGH (ref 15–41)
Albumin: 1.8 g/dL — ABNORMAL LOW (ref 3.5–5.0)
BILIRUBIN TOTAL: 0.6 mg/dL (ref 0.3–1.2)
Bilirubin, Direct: <0.1 mg/dL — ABNORMAL LOW (ref 0.1–0.5)
TOTAL PROTEIN: 5.5 g/dL — AB (ref 6.5–8.1)

## 2017-10-06 LAB — POCT I-STAT, CHEM 8
BUN: 58 mg/dL — AB (ref 6–20)
CALCIUM ION: 1.05 mmol/L — AB (ref 1.15–1.40)
CREATININE: 1.7 mg/dL — AB (ref 0.44–1.00)
Chloride: 105 mmol/L (ref 101–111)
Glucose, Bld: 176 mg/dL — ABNORMAL HIGH (ref 65–99)
HCT: 34 % — ABNORMAL LOW (ref 36.0–46.0)
Hemoglobin: 11.6 g/dL — ABNORMAL LOW (ref 12.0–15.0)
Potassium: 4.3 mmol/L (ref 3.5–5.1)
SODIUM: 137 mmol/L (ref 135–145)
TCO2: 20 mmol/L — AB (ref 22–32)

## 2017-10-06 LAB — CBC WITH DIFFERENTIAL/PLATELET
Basophils Absolute: 0 10*3/uL (ref 0.0–0.1)
Basophils Relative: 0 %
EOS ABS: 0 10*3/uL (ref 0.0–0.7)
Eosinophils Relative: 0 %
HCT: 23.2 % — ABNORMAL LOW (ref 36.0–46.0)
HEMOGLOBIN: 7.7 g/dL — AB (ref 12.0–15.0)
LYMPHS ABS: 1.4 10*3/uL (ref 0.7–4.0)
LYMPHS PCT: 6 %
MCH: 31.4 pg (ref 26.0–34.0)
MCHC: 33.2 g/dL (ref 30.0–36.0)
MCV: 94.7 fL (ref 78.0–100.0)
Monocytes Absolute: 0.9 10*3/uL (ref 0.1–1.0)
Monocytes Relative: 4 %
NEUTROS ABS: 20.3 10*3/uL — AB (ref 1.7–7.7)
Neutrophils Relative %: 90 %
Platelets: 276 10*3/uL (ref 150–400)
RBC: 2.45 MIL/uL — ABNORMAL LOW (ref 3.87–5.11)
RDW: 14.7 % (ref 11.5–15.5)
Smear Review: ADEQUATE
WBC: 22.6 10*3/uL — ABNORMAL HIGH (ref 4.0–10.5)

## 2017-10-06 LAB — PROTIME-INR
INR: 1.26
PROTHROMBIN TIME: 15.7 s — AB (ref 11.4–15.2)

## 2017-10-06 LAB — MAGNESIUM: Magnesium: 2.4 mg/dL (ref 1.7–2.4)

## 2017-10-06 LAB — APTT: APTT: 52 s — AB (ref 24–36)

## 2017-10-06 LAB — BRAIN NATRIURETIC PEPTIDE: B Natriuretic Peptide: 986.7 pg/mL — ABNORMAL HIGH (ref 0.0–100.0)

## 2017-10-06 LAB — PROCALCITONIN: PROCALCITONIN: 16.63 ng/mL

## 2017-10-06 LAB — LACTIC ACID, PLASMA: LACTIC ACID, VENOUS: 1.7 mmol/L (ref 0.5–1.9)

## 2017-10-06 MED ORDER — KCL IN DEXTROSE-NACL 20-5-0.45 MEQ/L-%-% IV SOLN
INTRAVENOUS | Status: DC
  Administered 2017-10-06 – 2017-10-07 (×2): via INTRAVENOUS
  Administered 2017-10-07: 100 mL/h via INTRAVENOUS
  Administered 2017-10-07 – 2017-10-10 (×5): via INTRAVENOUS
  Filled 2017-10-06 (×10): qty 1000

## 2017-10-06 MED ORDER — DIGOXIN 0.25 MG/ML IJ SOLN
0.5000 mg | Freq: Once | INTRAMUSCULAR | Status: AC
  Administered 2017-10-06: 0.5 mg via INTRAVENOUS
  Filled 2017-10-06: qty 2

## 2017-10-06 MED ORDER — DIGOXIN 125 MCG PO TABS
0.1250 mg | ORAL_TABLET | Freq: Every day | ORAL | Status: DC
  Administered 2017-10-07: 0.125 mg via ORAL
  Filled 2017-10-06: qty 1

## 2017-10-06 MED ORDER — DIPHENHYDRAMINE HCL 50 MG/ML IJ SOLN: 25.0000 mg | INTRAMUSCULAR | Status: DC | PRN

## 2017-10-06 MED ORDER — AMIODARONE IV BOLUS ONLY 150 MG/100ML
150.0000 mg | Freq: Once | INTRAVENOUS | Status: AC
  Administered 2017-10-06: 150 mg via INTRAVENOUS
  Filled 2017-10-06: qty 100

## 2017-10-06 MED ORDER — LEVOTHYROXINE SODIUM 100 MCG IV SOLR
50.0000 ug | Freq: Every day | INTRAVENOUS | Status: DC
  Administered 2017-10-06 – 2017-10-08 (×3): 50 ug via INTRAVENOUS
  Filled 2017-10-06 (×3): qty 5

## 2017-10-06 MED ORDER — DEXTROSE 5 % IV SOLN
INTRAVENOUS | Status: DC
  Administered 2017-10-06: 02:00:00 via INTRAVENOUS
  Filled 2017-10-06 (×2): qty 150

## 2017-10-06 MED ORDER — AMIODARONE HCL IN DEXTROSE 360-4.14 MG/200ML-% IV SOLN
60.0000 mg/h | INTRAVENOUS | Status: AC
  Administered 2017-10-06 (×2): 60 mg/h via INTRAVENOUS
  Filled 2017-10-06: qty 200

## 2017-10-06 MED ORDER — AMIODARONE HCL IN DEXTROSE 360-4.14 MG/200ML-% IV SOLN
30.0000 mg/h | INTRAVENOUS | Status: AC
  Administered 2017-10-06 – 2017-10-09 (×9): 30 mg/h via INTRAVENOUS
  Filled 2017-10-06 (×8): qty 200

## 2017-10-06 MED ORDER — ACETAMINOPHEN 10 MG/ML IV SOLN
1000.0000 mg | Freq: Four times a day (QID) | INTRAVENOUS | Status: AC | PRN
  Administered 2017-10-06 – 2017-10-07 (×4): 1000 mg via INTRAVENOUS
  Filled 2017-10-06 (×4): qty 100

## 2017-10-06 MED ORDER — DIGOXIN 0.25 MG/ML IJ SOLN
0.2500 mg | Freq: Once | INTRAMUSCULAR | Status: AC
  Administered 2017-10-06: 0.25 mg via INTRAVENOUS
  Filled 2017-10-06: qty 2

## 2017-10-06 MED ORDER — AMIODARONE LOAD VIA INFUSION
150.0000 mg | Freq: Once | INTRAVENOUS | Status: AC
  Administered 2017-10-06: 150 mg via INTRAVENOUS
  Filled 2017-10-06: qty 83.34

## 2017-10-06 NOTE — Progress Notes (Signed)
Progress Note  Patient Name: Tonya Mcgee Date of Encounter: 10/06/2017  Primary Cardiologist: New  Subjective   Complains of cough. Breathing is OK on Bipap. Denies chest pain or dizziness.  Inpatient Medications    Scheduled Meds: . budesonide (PULMICORT) nebulizer solution  0.5 mg Nebulization BID  . digoxin  0.25 mg Intravenous Once  . digoxin  0.5 mg Intravenous Once  . [START ON 10/07/2017] digoxin  0.125 mg Oral Daily  . hydroxychloroquine  400 mg Oral Daily  . levothyroxine  100 mcg Oral QAC breakfast  . methylPREDNISolone (SOLU-MEDROL) injection  40 mg Intravenous Q12H   Continuous Infusions: . acetaminophen Stopped (10/06/17 0425)  . amiodarone    . azithromycin Stopped (10/05/17 1407)  . aztreonam Stopped (10/06/17 0615)  . esmolol Stopped (10/06/17 0553)  . heparin 1,650 Units/hr (10/06/17 0728)  .  sodium bicarbonate  infusion 1000 mL 100 mL/hr at 10/06/17 0728   PRN Meds: acetaminophen, albuterol, fentaNYL (SUBLIMAZE) injection, ondansetron   Vital Signs    Vitals:   10/06/17 0615 10/06/17 0630 10/06/17 0645 10/06/17 0700  BP: (!) 92/59 97/64 (!) 99/55 108/87  Pulse: 90 (!) 161 (!) 108 (!) 105  Resp: (!) 26 (!) 21 (!) 25 (!) 27  Temp:      TempSrc:      SpO2: 98% 97% 98% 98%  Weight:      Height:        Intake/Output Summary (Last 24 hours) at 10/06/2017 0752 Last data filed at 10/06/2017 0728 Gross per 24 hour  Intake 3614.86 ml  Output 1050 ml  Net 2564.86 ml   Filed Weights   10/05/17 0900  Weight: 209 lb 7 oz (95 kg)    Telemetry    Afib with RVR rate 180-190. Personally Reviewed  ECG    Afib with RVR, nonspecific ST abnormality.   Physical Exam   GEN: overweight WF on Bipap Neck: No JVD Cardiac: IRRR, tachy,  no murmurs, rubs, or gallops.  Respiratory: bilateral coarse rales R>L GI: Soft, nontender, non-distended  MS: No edema; No deformity. Neuro:  Nonfocal  Psych: Normal affect   Labs    Chemistry Recent Labs    Lab 10/04/17 0958 10/05/17 0710 10/05/17 1100 10/06/17 0328  NA 129* 135 136 137  K 4.9 5.9* 5.1 4.3  CL 97* 104 104 105  CO2 19* 16* 16*  --   GLUCOSE 82 63* 102* 176*  BUN 53* 63* 65* 58*  CREATININE 2.32* 2.41* 2.32* 1.70*  CALCIUM 8.5* 8.1* 8.1*  --   GFRNONAA 19* 18* 19*  --   GFRAA 22* 21* 22*  --   ANIONGAP 13 15 16*  --      Hematology Recent Labs  Lab 10/04/17 0958 10/05/17 0534 10/06/17 0328  WBC 10.5 23.8*  --   RBC 2.74* 2.68*  --   HGB 8.8* 8.5* 11.6*  HCT 26.5* 25.9* 34.0*  MCV 96.7 96.6  --   MCH 32.1 31.7  --   MCHC 33.2 32.8  --   RDW 14.5 14.6  --   PLT 294 305  --     Cardiac Enzymes Recent Labs  Lab 10/04/17 1408  TROPONINI 0.05*    Recent Labs  Lab 10/04/17 1006  TROPIPOC 0.04     BNP Recent Labs  Lab 10/04/17 0954  BNP 417.6*     DDimer No results for input(s): DDIMER in the last 168 hours.   Radiology    US Renal  Result Date:  10/04/2017 CLINICAL DATA:  Renal failure EXAM: RENAL / URINARY TRACT ULTRASOUND COMPLETE COMPARISON:  None. FINDINGS: Right Kidney: Length: 9.7 cm. Echogenicity within normal limits. No mass or hydronephrosis visualized. Left Kidney: Length: 9.2 cm. Areas of cortical thinning. Normal echotexture. No hydronephrosis or mass. Bladder: Appears normal for degree of bladder distention. IMPRESSION: No acute findings.  No hydronephrosis. Electronically Signed   By: Charlett Nose M.D.   On: 10/04/2017 18:09   Dg Chest Portable 1 View  Result Date: 10/04/2017 CLINICAL DATA:  Status post central line placement today. EXAM: PORTABLE CHEST 1 VIEW COMPARISON:  Single view of the chest 10/04/2017. FINDINGS: Right IJ catheter is in place with the tip projecting in the mid to lower superior vena cava. No pneumothorax. Right much worse than left airspace disease is unchanged. Heart size is upper normal. IMPRESSION: Right IJ catheter projects in the mid to lower superior vena cava. No pneumothorax. No change in right much  worse than left airspace disease most consistent with multifocal pneumonia. Electronically Signed   By: Drusilla Kanner M.D.   On: 10/04/2017 13:19   Dg Chest Port 1 View  Result Date: 10/04/2017 CLINICAL DATA:  Shortness of breath EXAM: PORTABLE CHEST 1 VIEW COMPARISON:  July 11, 2016 FINDINGS: There is extensive airspace consolidation throughout portions of the right upper lobe and right middle lobe. There is fibrosis in both lung bases. There is no edema or consolidation on the left. Heart size and pulmonary vascular normal. No adenopathy. There is aortic atherosclerosis. No evident bone lesions. IMPRESSION: Extensive airspace consolidation on the right involving several segmental regions, consistent with pneumonia. 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 No edema evident. Heart size normal. There is aortic atherosclerosis. No evident adenopathy. Aortic Atherosclerosis (ICD10-I70.0). Electronically Signed   By: Bretta Bang III M.D.   On: 10/04/2017 10:42    Cardiac Studies   Echo 10/04/17: Study Conclusions  - Left ventricle: The cavity size was normal. Systolic function was   vigorous. The estimated ejection fraction was in the range of 65%   to 70%. Wall motion was normal; there were no regional wall   motion abnormalities. Doppler parameters are consistent with   abnormal left ventricular relaxation (grade 1 diastolic   dysfunction). Doppler parameters are consistent with high   ventricular filling pressure. - Aortic valve: Valve area (VTI): 2.57 cm^2. Valve area (Vmax):   2.64 cm^2. Valve area (Vmean): 2.35 cm^2. - Mitral valve: Calcified annulus. - Left atrium: The atrium was mildly dilated.  Impressions:  - Vigorous LV systolic function; mild diastolic dysfunction with   elevated LV filling pressure; mildly elevated LVOT velocity of   2.3 m/s likely related to vigorous LV function; mild LAE; trace   MR and TR.   Patient Profile     77 y.o. female with no prior cardiac  history presents with acute respiratory failure and pulmonary infiltrates.   Assessment & Plan    1. Acute hypoxic respiratory failure secondary to multilobar PNA/ sepsis with Haemophilus influenzae. No evidence of CHF. Very vigorous LV function by echo. Elevated BNP due to RV strain with respiratory failure. Continue Bipap support and antibiotics per CCM. 2. Afib with RVR. Patient given IV cardizem but developed hypotension. On Esmolol but still developed hypotension. Now BP 80 systolic. This severely limits medical therapy to control rate. DCCV alone will not help since she would just go back into Afib with multiple stressors. Amiodarone appears to be the best option. Concern about history of iodine allergy but  in speaking to the patient it is not clear what it was she had an allergic response to. This was years ago in [place]. While amiodarone does have a small amount of iodine review of literature does not show reaction to amiodarone is a population of patients with iodine allergy. After discussion with care team and pharmacy we have decided to start amiodarone. Will infuse slowly initially and monitor closely for any reaction. She is on steroids would should help will potential allergic response. We can also load with IV digoxin.  3. Anemia  4. Minimally elevated troponin c/w sepsis syndrome 5. ARF creatinine improving.  6. H flu Bacteremia   Patient acutely ill with sepsis/PNA. Management per CCM. Nothing further to add from a cardiac standpoint. Will sign off. Please call with questions.   For questions or updates, please contact [facility] Please consult [email or web address] for contact info under Cardiology/STEMI.      Signed, Xian Apostol [place], MD  10/06/2017, 7:52 AM

## 2017-10-06 NOTE — Progress Notes (Signed)
ANTICOAGULATION CONSULT NOTE   Pharmacy Consult for Heparin Indication: Antiphospholipid antibody syndrome  Allergies  Allergen Reactions  . Iodine Hives    ANTISEPTICS & DISINFECTANTS Throat swelling  . Sulfa Antibiotics     Vomiting and diarrhea  . Amoxicillin Hives  . Ciprofloxacin Hives  . Codeine Other (See Comments) and Hives    Weird dreams  . Keflex  [Cephalexin]   . Penicillins Hives  . Pravastatin Nausea Only  . Tape     skin red irritated   Patient Measurements: Height: 5\' 2"  (157.5 cm) Weight: 209 lb 7 oz (95 kg) IBW/kg (Calculated) : 50.1 Heparin Dosing Weight: 72.3kg   Vital Signs: Temp: 96.9 F (36.1 C) (04/26 0806) Temp Source: Axillary (04/26 0806) BP: 104/63 (04/26 0900) Pulse Rate: 145 (04/26 0900)  Labs: Recent Labs    10/04/17 0958 10/04/17 1408 10/05/17 0534  10/05/17 0823 10/05/17 1100 10/05/17 2042 10/06/17 0328 10/06/17 0650 10/06/17 0709  HGB 8.8*  --  8.5*  --   --   --   --  11.6*  --  7.7*  HCT 26.5*  --  25.9*  --   --   --   --  34.0*  --  23.2*  PLT 294  --  305  --   --   --   --   --   --  276  APTT  --  22*  --   --   --   --   --   --   --  52*  LABPROT  --  14.5  --   --   --   --   --   --   --  15.7*  INR  --  1.14  --   --   --   --   --   --   --  1.26  HEPARINUNFRC  --   --   --   --  0.25*  --  0.20*  --  0.40  --   CREATININE 2.32*  --   --    < >  --  2.32*  --  1.70*  --  1.85*  TROPONINI  --  0.05*  --   --   --   --   --   --   --   --    < > = values in this interval not displayed.   Estimated Creatinine Clearance: 26.9 mL/min (A) (by C-G formula based on SCr of 1.85 mg/dL (H)).  Assessment: 63 yoF female admitted on 10/04/2017 with sepsis. Patient has a history of positive antiphospholipid antibody syndrome. Not currently on anticoagulation PTA, but has been on warfarin in the past which she stopped taking for non-hemorrhagic reasons. Heparin level this AM is within goal range at 0.40. Hgb down to 7.7, pltc  WNL. No bleeding noted.   Goal of Therapy:  Heparin level 0.3-0.7 units/ml Monitor platelets by anticoagulation protocol: Yes   Plan:  Continue heparin gtt at 1650 units/hr  Daily CBC and heparin level Monitor for s/sx of bleeding  Erin N. 10/06/2017, PharmD PGY1 Pharmacy Resident Pager: [phone] 10/06/2017 11:01 AM

## 2017-10-06 NOTE — Progress Notes (Signed)
At 0300 Pt went into afib RVR with rates 190's. Cardiology called and came to beside. EKG was completed. Cardiology fellow stated to only keep esmolol on if SBP >100. CCM NP later rounded and stated not to take her off the esmolol drip at all.  Pt still having labile BP and HR.  Cardiology called again and came to bedside, stated since BP so low turn off the esmolol since it is not affected HR (heart rate still in 150-180's) and SBP is ranging from 70-90's.

## 2017-10-06 NOTE — Progress Notes (Signed)
PULMONARY / CRITICAL CARE MEDICINE   Name: Tonya Mcgee MRN: [id number] DOB: [birth year]    ADMISSION DATE:  10/04/2017  REFERRING MD:  Lynelle Doctor  CHIEF COMPLAINT:  Respiratory distress  HISTORY OF PRESENT ILLNESS:   This is a 77 year old female with minimal past medical history other than hypertension who presented 4/24 with respiratory distress.  According to her husband she had 1 day history of shortness of breath, congestion, orthopnea.  She got out of bed that evening to go sleep in the recliner due to orthopnea and when husband awoke on the AM of admission she was minimally responsive.  EMS placed her on BiPAP.  In ER she had a labile blood pressure with SBP 60s at times.  Normal WBC, afebrile, BNP 417, normal lactate.  She has been maintained on BiPaP as she declines intubation unless she arrests. She denies prior OHD. Blood cultures are positive for H. Influ., for which she is on IV Zithromax and Azactam (beta-lactam allergy).  PAST MEDICAL HISTORY :  She  has a past medical history of History of chicken pox, History of measles, and History of mumps.  PAST SURGICAL HISTORY: She  has a past surgical history that includes Carpal tunnel release (Right, 1990); Knee surgery (Left, 1989); Appendectomy (1960); Tonsillectomy (1946); Carotid doppler ultrasound (11/18/2013); pap smear (2012); mammogram screening (2012); Bone Density Study (2011); and Colonoscopy Screening (2011).  Allergies  Allergen Reactions  . Iodine Hives    ANTISEPTICS & DISINFECTANTS Throat swelling  . Sulfa Antibiotics     Vomiting and diarrhea  . Amoxicillin Hives  . Ciprofloxacin Hives  . Codeine Other (See Comments) and Hives    Weird dreams  . Keflex  [Cephalexin]   . Penicillins Hives  . Pravastatin Nausea Only  . Tape     skin red irritated    No current facility-administered medications on file prior to encounter.    Current Outpatient Medications on File Prior to Encounter  Medication Sig  .  albuterol (PROVENTIL HFA;VENTOLIN HFA) 108 (90 Base) MCG/ACT inhaler Inhale 2 puffs into the lungs every 6 (six) hours as needed for wheezing or shortness of breath.  Marland Kitchen aspirin 81 MG tablet Take 1 tablet by mouth daily.  Marland Kitchen atorvastatin (LIPITOR) 10 MG tablet TAKE 1 TABLET DAILY  . Ferrous Sulfate (IRON) 325 (65 Fe) MG TABS Take 1 tablet by mouth daily.  . furosemide (LASIX) 20 MG tablet Take 1 tablet (20 mg total) by mouth daily.  . hydroxychloroquine (PLAQUENIL) 200 MG tablet Take 2 tablets by mouth daily.  Marland Kitchen levothyroxine (SYNTHROID, LEVOTHROID) 100 MCG tablet TAKE ONE TABLET DAILY  . lisinopril-hydrochlorothiazide (PRINZIDE,ZESTORETIC) 10-12.5 MG tablet TAKE 1 TABLET DAILY  . Multiple Vitamins-Minerals (MULTIVITAMIN ADULT PO) Take 1 tablet by mouth daily.  . Omega 3 1200 MG CAPS Take 1 capsule by mouth daily.  . cyclobenzaprine (FLEXERIL) 5 MG tablet Take 1 tablet (5 mg total) by mouth at bedtime.  . naproxen (NAPROSYN) 500 MG tablet Take 1 tablet (500 mg total) by mouth 2 (two) times daily as needed.  . traMADol (ULTRAM) 50 MG tablet Take 1 tablet (50 mg total) by mouth every 8 (eight) hours as needed.    FAMILY HISTORY:  Her indicated that her mother is deceased. She indicated that her father is deceased. She indicated that her sister is alive. She indicated that her brother is alive.   SOCIAL HISTORY: She  reports that she quit smoking about 39 years ago. Her smoking use included cigarettes. She has a  7.50 pack-year smoking history. She has never used smokeless tobacco. She reports that she drinks alcohol. She reports that she does not use drugs.  REVIEW OF SYSTEMS:   Per HPI  SUBJECTIVE:  Denies acute distress. Would like to come off BiPaP so she can eat.  VITAL SIGNS: BP 103/76   Pulse (!) 142   Temp 98.1 F (36.7 C) (Axillary)   Resp (!) 28   Ht 5\' 2"  (1.575 m)   Wt 95 kg (209 lb 7 oz)   SpO2 100%   BMI 38.31 kg/m   HEMODYNAMICS: CVP:  [9 mmHg-19 mmHg] 9  mmHg  VENTILATOR SETTINGS: FiO2 (%):  [60 %] 60 %  INTAKE / OUTPUT: I/O last 3 completed shifts: In: 3771.7 [I.V.:3121.7; IV Piggyback:650] Out: 1825 [Urine:1825]  PHYSICAL EXAMINATION: General:  WD/WN WF mild resp distress Neuro:  [place]/AT A&O x 3. No focal deficits HEENT:  [place]/AT PRRL EOMI Cardiovascular:  Tachy but reg, no mumurs Lungs:  Bilat crackles R>L with bronchovesicular BS on R posteriorly Abdomen:  Supple, no guarding; no definite H-S-megaly Musculoskeletal:  No active joints Skin:  No edema  LABS:  BMET Recent Labs  Lab 10/05/17 0710 10/05/17 1100 10/06/17 0328 10/06/17 0709  NA 135 136 137 138  K 5.9* 5.1 4.3 4.0  CL 104 104 105 104  CO2 16* 16*  --  21*  BUN 63* 65* 58* 77*  CREATININE 2.41* 2.32* 1.70* 1.85*  GLUCOSE 63* 102* 176* 145*    Electrolytes Recent Labs  Lab 10/05/17 0710 10/05/17 1100 10/06/17 0709  CALCIUM 8.1* 8.1* 7.3*  MG  --  2.3 2.4  PHOS  --   --  5.3*    CBC Recent Labs  Lab 10/04/17 0958 10/05/17 0534 10/06/17 0328 10/06/17 0709  WBC 10.5 23.8*  --  22.6*  HGB 8.8* 8.5* 11.6* 7.7*  HCT 26.5* 25.9* 34.0* 23.2*  PLT 294 305  --  276    Coag's Recent Labs  Lab 10/04/17 1408 10/06/17 0709  APTT 22* 52*  INR 1.14 1.26    Sepsis Markers Recent Labs  Lab 10/04/17 1008 10/04/17 1408 10/04/17 1704 10/05/17 0710 10/06/17 0655 10/06/17 0709  LATICACIDVEN 1.71  --  1.3  --  1.7  --   PROCALCITON  --  15.94  --  23.83  --  16.63    ABG Recent Labs  Lab 10/05/17 0346 10/05/17 1226 10/06/17 0325  PHART 7.322* 7.339* 7.382  PCO2ART 31.6* 29.1* 31.6*  PO2ART 105.0 86.0 134.0*    Liver Enzymes Recent Labs  Lab 10/06/17 0709  AST 45*  ALT 31  ALKPHOS 94  BILITOT 0.6  ALBUMIN 1.8*    Cardiac Enzymes Recent Labs  Lab 10/04/17 1408  TROPONINI 0.05*    Glucose Recent Labs  Lab 10/05/17 0910 10/05/17 1143 10/05/17 1647 10/05/17 1952 10/06/17 0407 10/06/17 0805  GLUCAP 67 93 108* 117* 164*  150*    Imaging Dg Chest Port 1 View  Result Date: 10/06/2017 CLINICAL DATA:  Shortness of breath. EXAM: PORTABLE CHEST 1 VIEW COMPARISON:  Radiograph of October 04, 2017. FINDINGS: Stable cardiomediastinal silhouette. Atherosclerosis of thoracic aorta is noted. No pneumothorax is noted. Right internal jugular catheter is noted with distal tip in expected position of the SVC. Stable right upper lobe airspace opacity is noted most consistent with pneumonia. Stable mild left basilar atelectasis or infiltrate is noted. Bony thorax is unremarkable. IMPRESSION: Stable right upper lobe airspace opacity is noted concerning for pneumonia. Stable mild left basilar  subsegmental atelectasis or infiltrate. Aortic Atherosclerosis (ICD10-I70.0). Electronically Signed   By: Lupita Raider, M.D.   On: 10/06/2017 09:15     STUDIES:  PCXR personally viewed and I agree with above 2D ECHO eith EF>70%  CULTURES: Blood + H influ Urine neg  ANTIBIOTICS: IV Zithromax and Azactam  SIGNIFICANT EVENTS: None  LINES/TUBES: RIJ  DISCUSSION: 77 year old female with no prior hx OHD presented to the ED with respiratory failure, hypoxemia, BiPAP and septic shock from a RLL PNA.  Blood cultures pos for H influ. Was placed on bicarb infusion for reason not totally clear as  ABGs never showed a severe acidemia.  ASSESSMENT / PLAN:  PULMONARY A: Likely H. influ CAP with bacteremia. Gas exchange appears to be improving P:   Cont current ABx. Will give a trial of NRM to see if she can tolerate without BiPaP.  CARDIOVASCULAR A:  AF with RVR P:  Cont Amiodarone + Dig per Cardiol  RENAL A:   Azotemia with pre-renal indices. Increasing BUN with declining Creat may in part be related to syst CS. Good U/O P:   Follow trend, Will d/c bicarb drip and replace with D5 1/2NS  HEMATOLOGIC A:   [place] anemia likely related to this acute process. P:  Will hemoccult stools however. Trend CBC  INFECTIOUS A:   H  influ CAP with bacteremia P:   See Pulm A/P above  ENDOCRINE A:   Hx hypothyroidism   P:   On Synthroid  NEUROLOGIC A:   Altered MS resolved P:   RASS goal: N/A Minimize sedation  Critical Care time 60 min  Pulmonary and Critical Care Medicine [facility] Pager: [phone]  10/06/2017, 12:00 PM

## 2017-10-06 NOTE — Progress Notes (Signed)
Pt converted from Afib/flutter RVR HR 130's to SR with PACs, HR 80's at 1744. Strip saved and placed in pt's chart.

## 2017-10-07 ENCOUNTER — Inpatient Hospital Stay (HOSPITAL_COMMUNITY): Payer: Medicare Other

## 2017-10-07 ENCOUNTER — Inpatient Hospital Stay: Payer: Self-pay

## 2017-10-07 DIAGNOSIS — I480 Paroxysmal atrial fibrillation: Secondary | ICD-10-CM

## 2017-10-07 DIAGNOSIS — I509 Heart failure, unspecified: Secondary | ICD-10-CM

## 2017-10-07 LAB — CBC WITH DIFFERENTIAL/PLATELET
Basophils Absolute: 0 10*3/uL (ref 0.0–0.1)
Basophils Relative: 0 %
EOS PCT: 0 %
Eosinophils Absolute: 0 10*3/uL (ref 0.0–0.7)
HEMATOCRIT: 22.3 % — AB (ref 36.0–46.0)
HEMOGLOBIN: 7.3 g/dL — AB (ref 12.0–15.0)
LYMPHS ABS: 1.3 10*3/uL (ref 0.7–4.0)
LYMPHS PCT: 5 %
MCH: 31.6 pg (ref 26.0–34.0)
MCHC: 32.7 g/dL (ref 30.0–36.0)
MCV: 96.5 fL (ref 78.0–100.0)
MONOS PCT: 5 %
Monocytes Absolute: 1.3 10*3/uL — ABNORMAL HIGH (ref 0.1–1.0)
Neutro Abs: 24.1 10*3/uL — ABNORMAL HIGH (ref 1.7–7.7)
Neutrophils Relative %: 90 %
Platelets: 240 10*3/uL (ref 150–400)
RBC: 2.31 MIL/uL — AB (ref 3.87–5.11)
RDW: 14.7 % (ref 11.5–15.5)
WBC: 26.7 10*3/uL — ABNORMAL HIGH (ref 4.0–10.5)

## 2017-10-07 LAB — HEPARIN LEVEL (UNFRACTIONATED): Heparin Unfractionated: 0.24 IU/mL — ABNORMAL LOW (ref 0.30–0.70)

## 2017-10-07 LAB — GLUCOSE, CAPILLARY
GLUCOSE-CAPILLARY: 140 mg/dL — AB (ref 65–99)
GLUCOSE-CAPILLARY: 156 mg/dL — AB (ref 65–99)
Glucose-Capillary: 130 mg/dL — ABNORMAL HIGH (ref 65–99)

## 2017-10-07 LAB — CULTURE, BLOOD (ROUTINE X 2): Special Requests: ADEQUATE

## 2017-10-07 LAB — CBC
HEMATOCRIT: 22.4 % — AB (ref 36.0–46.0)
HEMOGLOBIN: 7.2 g/dL — AB (ref 12.0–15.0)
MCH: 30.6 pg (ref 26.0–34.0)
MCHC: 32.1 g/dL (ref 30.0–36.0)
MCV: 95.3 fL (ref 78.0–100.0)
Platelets: 249 10*3/uL (ref 150–400)
RBC: 2.35 MIL/uL — AB (ref 3.87–5.11)
RDW: 14.4 % (ref 11.5–15.5)
WBC: 28.1 10*3/uL — ABNORMAL HIGH (ref 4.0–10.5)

## 2017-10-07 LAB — ABO/RH: ABO/RH(D): A POS

## 2017-10-07 MED ORDER — BOOST / RESOURCE BREEZE PO LIQD CUSTOM
1.0000 | Freq: Three times a day (TID) | ORAL | Status: DC
  Administered 2017-10-07 – 2017-10-13 (×11): 1 via ORAL

## 2017-10-07 MED ORDER — CHLORHEXIDINE GLUCONATE 0.12 % MT SOLN
15.0000 mL | Freq: Two times a day (BID) | OROMUCOSAL | Status: DC
  Administered 2017-10-07 – 2017-10-16 (×18): 15 mL via OROMUCOSAL
  Filled 2017-10-07 (×14): qty 15

## 2017-10-07 MED ORDER — ORAL CARE MOUTH RINSE
15.0000 mL | Freq: Two times a day (BID) | OROMUCOSAL | Status: DC
  Administered 2017-10-07 – 2017-10-15 (×12): 15 mL via OROMUCOSAL

## 2017-10-07 NOTE — Progress Notes (Addendum)
ANTICOAGULATION CONSULT NOTE   Pharmacy Consult for Heparin Indication: Antiphospholipid antibody syndrome / New Afib  Allergies  Allergen Reactions  . Iodine Hives    Tolerated amiodarone 09/2017 ANTISEPTICS & DISINFECTANTS Throat swelling  . Sulfa Antibiotics     Vomiting and diarrhea  . Amoxicillin Hives  . Ciprofloxacin Hives  . Codeine Other (See Comments) and Hives    Weird dreams  . Keflex  [Cephalexin]   . Penicillins Hives  . Pravastatin Nausea Only  . Tape     skin red irritated   Patient Measurements: Height: 5\' 2"  (157.5 cm) Weight: 209 lb 7 oz (95 kg) IBW/kg (Calculated) : 50.1 Heparin Dosing Weight: 72.3kg   Vital Signs: Temp: 97.6 F (36.4 C) (04/27 0734) Temp Source: Axillary (04/27 0734) BP: 154/84 (04/27 0900) Pulse Rate: 78 (04/27 0900)  Labs: Recent Labs    10/04/17 1408  10/05/17 0534  10/05/17 1100 10/05/17 2042 10/06/17 0328 10/06/17 0650 10/06/17 0709 10/07/17 0202  HGB  --    < > 8.5*  --   --   --  11.6*  --  7.7* 7.2*  HCT  --    < > 25.9*  --   --   --  34.0*  --  23.2* 22.4*  PLT  --   --  305  --   --   --   --   --  276 249  APTT 22*  --   --   --   --   --   --   --  52*  --   LABPROT 14.5  --   --   --   --   --   --   --  15.7*  --   INR 1.14  --   --   --   --   --   --   --  1.26  --   HEPARINUNFRC  --   --   --    < >  --  0.20*  --  0.40  --  0.24*  CREATININE  --   --   --    < > 2.32*  --  1.70*  --  1.85*  --   TROPONINI 0.05*  --   --   --   --   --   --   --   --   --    < > = values in this interval not displayed.   Estimated Creatinine Clearance: 26.9 mL/min (A) (by C-G formula based on SCr of 1.85 mg/dL (H)).  Assessment: 36 yoF female admitted on 10/04/2017 with sepsis. Patient has a history of positive antiphospholipid antibody syndrome. Not currently on anticoagulation PTA, but has been on warfarin in the past which she stopped taking for non-hemorrhagic reasons and has been stable on bASA for years.  New  Afib this admit in setting of sepsis now in SR on amiodarone drip. Heparin level 0.24 < goal this AM is on heparin drip rate 1650 uts/hr   Hgb down 8.8 > 7.2, pltc WNL. No bleeding noted.   Goal of Therapy:  Heparin level 0.3-0.7 units/ml Monitor platelets by anticoagulation protocol: Yes   Plan:  Increase heparin gtt 1700  units/hr  Daily CBC and heparin level Monitor for s/sx of bleeding  10/06/2017 Pharm.D. CPP, BCPS Clinical Pharmacist [phone] 10/07/2017 10:22 AM

## 2017-10-07 NOTE — Progress Notes (Signed)
PULMONARY / CRITICAL CARE MEDICINE   Name: Tonya Mcgee MRN: [id number] DOB: 1940/09/17    ADMISSION DATE:  10/04/2017  REFERRING MD:  Lynelle Doctor  CHIEF COMPLAINT:  Respiratory distress  HISTORY OF PRESENT ILLNESS:   This is a 77 year old female with minimal past medical history other than hypertension who presented 4/24 with respiratory distress. According to her husband she had 1 day history of shortness of breath, congestion, orthopnea. She got out of bed that evening to go sleep in the recliner due to orthopnea and when husband awoke on the AM of admission she was minimally responsive. EMS placed her on BiPAP. In ER she had a labile blood pressure with SBP 60s at times. Normal WBC, afebrile, BNP 417, normal lactate. She has been maintained on BiPaP as she declines intubation unless she arrests. She denies prior OHD. Blood cultures are positive for H. Influ., for which she is on IV Zithromax and Azactam (beta-lactam allergy).  PAST MEDICAL HISTORY :  She  has a past medical history of History of chicken pox, History of measles, and History of mumps.  PAST SURGICAL HISTORY: She  has a past surgical history that includes Carpal tunnel release (Right, 1990); Knee surgery (Left, 1989); Appendectomy (1960); Tonsillectomy (1946); Carotid doppler ultrasound (11/18/2013); pap smear (2012); mammogram screening (2012); Bone Density Study (2011); and Colonoscopy Screening (2011).  Allergies  Allergen Reactions  . Iodine Hives    Tolerated amiodarone 09/2017 ANTISEPTICS & DISINFECTANTS Throat swelling  . Sulfa Antibiotics     Vomiting and diarrhea  . Amoxicillin Hives  . Ciprofloxacin Hives  . Codeine Other (See Comments) and Hives    Weird dreams  . Keflex  [Cephalexin]   . Penicillins Hives  . Pravastatin Nausea Only  . Tape     skin red irritated    No current facility-administered medications on file prior to encounter.    Current Outpatient Medications on File Prior to Encounter   Medication Sig  . albuterol (PROVENTIL HFA;VENTOLIN HFA) 108 (90 Base) MCG/ACT inhaler Inhale 2 puffs into the lungs every 6 (six) hours as needed for wheezing or shortness of breath.  Marland Kitchen aspirin 81 MG tablet Take 1 tablet by mouth daily.  Marland Kitchen atorvastatin (LIPITOR) 10 MG tablet TAKE 1 TABLET DAILY  . Ferrous Sulfate (IRON) 325 (65 Fe) MG TABS Take 1 tablet by mouth daily.  . furosemide (LASIX) 20 MG tablet Take 1 tablet (20 mg total) by mouth daily.  . hydroxychloroquine (PLAQUENIL) 200 MG tablet Take 2 tablets by mouth daily.  Marland Kitchen levothyroxine (SYNTHROID, LEVOTHROID) 100 MCG tablet TAKE ONE TABLET DAILY  . lisinopril-hydrochlorothiazide (PRINZIDE,ZESTORETIC) 10-12.5 MG tablet TAKE 1 TABLET DAILY  . Multiple Vitamins-Minerals (MULTIVITAMIN ADULT PO) Take 1 tablet by mouth daily.  . Omega 3 1200 MG CAPS Take 1 capsule by mouth daily.  . cyclobenzaprine (FLEXERIL) 5 MG tablet Take 1 tablet (5 mg total) by mouth at bedtime.  . naproxen (NAPROSYN) 500 MG tablet Take 1 tablet (500 mg total) by mouth 2 (two) times daily as needed.  . traMADol (ULTRAM) 50 MG tablet Take 1 tablet (50 mg total) by mouth every 8 (eight) hours as needed.    FAMILY HISTORY:  Her indicated that her mother is deceased. She indicated that her father is deceased. She indicated that her sister is alive. She indicated that her brother is alive.   SOCIAL HISTORY: She  reports that she quit smoking about 39 years ago. Her smoking use included cigarettes. She has a 7.50 pack-year smoking  history. She has never used smokeless tobacco. She reports that she drinks alcohol. She reports that she does not use drugs.  SUBJECTIVE:  Thinks her breathing is better today c/w yesterday  VITAL SIGNS: BP (!) 141/62 (BP Location: Left Wrist)   Pulse 77   Temp 98.4 F (36.9 C) (Oral)   Resp 20   Ht 5\' 2"  (1.575 m)   Wt 95 kg (209 lb 7 oz)   SpO2 95%   BMI 38.31 kg/m   HEMODYNAMICS: CVP:  [12 mmHg] 12 mmHg  INTAKE / OUTPUT: I/O  last 3 completed shifts: In: 5741.9 [I.V.:4691.9; IV Piggyback:1050] Out: 1850 [Urine:1850]  PHYSICAL EXAMINATION: General:  WD/WN WF mild resp distress Neuro:  A&O. No focal deficits HEENT:  [place]/AT PERRL EOMI OP-neg Cardiovascular:  RRR no m/r/g Lungs:  Markedly decreased/absent BS on R with (? Hyper) resonant percussion note Abdomen:  Supple, NT +BS Musculoskeletal:  No active joints Skin:  No C/C/E  LABS:  BMET Recent Labs  Lab 10/05/17 0710 10/05/17 1100 10/06/17 0328 10/06/17 0709  NA 135 136 137 138  K 5.9* 5.1 4.3 4.0  CL 104 104 105 104  CO2 16* 16*  --  21*  BUN 63* 65* 58* 77*  CREATININE 2.41* 2.32* 1.70* 1.85*  GLUCOSE 63* 102* 176* 145*    Electrolytes Recent Labs  Lab 10/05/17 0710 10/05/17 1100 10/06/17 0709  CALCIUM 8.1* 8.1* 7.3*  MG  --  2.3 2.4  PHOS  --   --  5.3*    CBC Recent Labs  Lab 10/06/17 0709 10/07/17 0202 10/07/17 1225  WBC 22.6* 28.1* 26.7*  HGB 7.7* 7.2* 7.3*  HCT 23.2* 22.4* 22.3*  PLT 276 249 240    Coag's Recent Labs  Lab 10/04/17 1408 10/06/17 0709  APTT 22* 52*  INR 1.14 1.26    Sepsis Markers Recent Labs  Lab 10/04/17 1008 10/04/17 1408 10/04/17 1704 10/05/17 0710 10/06/17 0655 10/06/17 0709  LATICACIDVEN 1.71  --  1.3  --  1.7  --   PROCALCITON  --  15.94  --  23.83  --  16.63    ABG Recent Labs  Lab 10/05/17 0346 10/05/17 1226 10/06/17 0325  PHART 7.322* 7.339* 7.382  PCO2ART 31.6* 29.1* 31.6*  PO2ART 105.0 86.0 134.0*    Liver Enzymes Recent Labs  Lab 10/06/17 0709  AST 45*  ALT 31  ALKPHOS 94  BILITOT 0.6  ALBUMIN 1.8*    Cardiac Enzymes Recent Labs  Lab 10/04/17 1408  TROPONINI 0.05*    Glucose Recent Labs  Lab 10/06/17 0805 10/06/17 1150 10/06/17 1643 10/06/17 2351 10/07/17 0353 10/07/17 0733  GLUCAP 150* 148* 146* 130* 140* 156*    Imaging No results found.   STUDIES:  Stat PCXR  CULTURES: Blood + H influ Urine neg  ANTIBIOTICS: IV Zithromax and  Azactam  SIGNIFICANT EVENTS: None  LINES/TUBES: RIJ was dislodged Periph IV  DISCUSSION: 77 year old female with no prior hx OHD presented to the ED with respiratory failure, hypoxemia, BiPAP and septic shock from a RLL PNA. Blood cultures pos for H influ. With decreased BS on R am concerned about mucous plug with atelectasis vs ptx.  ASSESSMENT / PLAN:  PULMONARY A: H influ CAP with bacteremia. Tolerating NRM OK P:   PCXR - reviewed personally - No ptx  CARDIOVASCULAR A:  AF with RVR P:  Cardiol plans to switch to po meds  RENAL A:   No labs today P:   Order BMP for  AM  GASTROINTESTINAL A:   Patient had been NPO, in part because of possible ned for intubation P:  As now on NRM will give trial of Ensure  HEMATOLOGIC A:   [place] anemia P:  CBC in AM  INFECTIOUS A:   H influ CAP with bacteremia P:   Cont Zithromax and Aztreonam  ENDOCRINE A:   Status   P:   Synthroid  Critical care time: 30 min   Pulmonary and Critical Care Medicine [facility] Pager: [phone]  10/07/2017, 4:37 PM

## 2017-10-07 NOTE — Progress Notes (Signed)
Spoke with Celenia RN re PICC placement in the AM.

## 2017-10-07 NOTE — Progress Notes (Signed)
Progress Note  Patient Name: Tonya Mcgee Date of Encounter: 10/07/2017  Primary Cardiologist: Ellis Parents ([place])  Subjective   No complaints overnight. Apparently had some bleeding. On Heparin and IV amiodarone. She is back in sinus rhythm.  Inpatient Medications    Scheduled Meds: . budesonide (PULMICORT) nebulizer solution  0.5 mg Nebulization BID  . chlorhexidine  15 mL Mouth Rinse BID  . digoxin  0.125 mg Oral Daily  . hydroxychloroquine  400 mg Oral Daily  . levothyroxine  50 mcg Intravenous Daily  . mouth rinse  15 mL Mouth Rinse q12n4p  . methylPREDNISolone (SOLU-MEDROL) injection  40 mg Intravenous Q12H   Continuous Infusions: . amiodarone 30 mg/hr (10/07/17 1000)  . azithromycin Stopped (10/06/17 1328)  . aztreonam Stopped (10/07/17 0530)  . dextrose 5 % and 0.45 % NaCl with KCl 20 mEq/L 100 mL/hr at 10/07/17 1026  . heparin 1,700 Units/hr (10/07/17 1024)   PRN Meds: albuterol, diphenhydrAMINE, fentaNYL (SUBLIMAZE) injection, ondansetron   Vital Signs    Vitals:   10/07/17 0800 10/07/17 0839 10/07/17 0900 10/07/17 1000  BP: (!) 149/77  (!) 154/84 (!) 143/72  Pulse: 77  78 75  Resp: (!) 21  (!) 28 18  Temp:      TempSrc:      SpO2: 99% 99% 100% 100%  Weight:      Height:        Intake/Output Summary (Last 24 hours) at 10/07/2017 1120 Last data filed at 10/07/2017 1024 Gross per 24 hour  Intake 3624.93 ml  Output 1300 ml  Net 2324.93 ml   Filed Weights   10/05/17 0900  Weight: 209 lb 7 oz (95 kg)    Telemetry    Sinus rhythm - personally reviewed  ECG    None today  Physical Exam   GEN: awake, NAD on NRB mask Neck: No JVD Cardiac: RRR, no murmur Respiratory: decreased BS bilaterally GI: Soft, nontender, non-distended  MS: No edema; No deformity. Neuro:  Nonfocal  Psych: Normal affect   Labs    Chemistry Recent Labs  Lab 10/05/17 0710 10/05/17 1100 10/06/17 0328 10/06/17 0709  NA 135 136 137 138  K 5.9* 5.1 4.3 4.0  CL 104 104  105 104  CO2 16* 16*  --  21*  GLUCOSE 63* 102* 176* 145*  BUN 63* 65* 58* 77*  CREATININE 2.41* 2.32* 1.70* 1.85*  CALCIUM 8.1* 8.1*  --  7.3*  PROT  --   --   --  5.5*  ALBUMIN  --   --   --  1.8*  AST  --   --   --  45*  ALT  --   --   --  31  ALKPHOS  --   --   --  94  BILITOT  --   --   --  0.6  GFRNONAA 18* 19*  --  25*  GFRAA 21* 22*  --  29*  ANIONGAP 15 16*  --  13     Hematology Recent Labs  Lab 10/05/17 0534 10/06/17 0328 10/06/17 0709 10/07/17 0202  WBC 23.8*  --  22.6* 28.1*  RBC 2.68*  --  2.45* 2.35*  HGB 8.5* 11.6* 7.7* 7.2*  HCT 25.9* 34.0* 23.2* 22.4*  MCV 96.6  --  94.7 95.3  MCH 31.7  --  31.4 30.6  MCHC 32.8  --  33.2 32.1  RDW 14.6  --  14.7 14.4  PLT 305  --  276 249  Cardiac Enzymes Recent Labs  Lab 10/04/17 1408  TROPONINI 0.05*    Recent Labs  Lab 10/04/17 1006  TROPIPOC 0.04     BNP Recent Labs  Lab 10/04/17 0954 10/06/17 0655  BNP 417.6* 986.7*     DDimer No results for input(s): DDIMER in the last 168 hours.   Radiology    Dg Chest Port 1 View  Result Date: 10/06/2017 CLINICAL DATA:  Shortness of breath. EXAM: PORTABLE CHEST 1 VIEW COMPARISON:  Radiograph of October 04, 2017. FINDINGS: Stable cardiomediastinal silhouette. Atherosclerosis of thoracic aorta is noted. No pneumothorax is noted. Right internal jugular catheter is noted with distal tip in expected position of the SVC. Stable right upper lobe airspace opacity is noted most consistent with pneumonia. Stable mild left basilar atelectasis or infiltrate is noted. Bony thorax is unremarkable. IMPRESSION: Stable right upper lobe airspace opacity is noted concerning for pneumonia. Stable mild left basilar subsegmental atelectasis or infiltrate. Aortic Atherosclerosis (ICD10-I70.0). Electronically Signed   By: Lupita Raider, M.D.   On: 10/06/2017 09:15    Cardiac Studies   Echo 10/04/17: Study Conclusions  - Left ventricle: The cavity size was normal. Systolic  function was   vigorous. The estimated ejection fraction was in the range of 65%   to 70%. Wall motion was normal; there were no regional wall   motion abnormalities. Doppler parameters are consistent with   abnormal left ventricular relaxation (grade 1 diastolic   dysfunction). Doppler parameters are consistent with high   ventricular filling pressure. - Aortic valve: Valve area (VTI): 2.57 cm^2. Valve area (Vmax):   2.64 cm^2. Valve area (Vmean): 2.35 cm^2. - Mitral valve: Calcified annulus. - Left atrium: The atrium was mildly dilated.  Impressions:  - Vigorous LV systolic function; mild diastolic dysfunction with   elevated LV filling pressure; mildly elevated LVOT velocity of   2.3 m/s likely related to vigorous LV function; mild LAE; trace   MR and TR.   Patient Profile     77 y.o. female with no prior cardiac history presents with acute respiratory failure and pulmonary infiltrates.   Assessment & Plan    1. Acute hypoxic respiratory failure secondary to multilobar PNA/ sepsis with Haemophilus influenzae. No evidence of CHF. Very vigorous LV function by echo. Elevated BNP due to RV strain with respiratory failure.   - weaned down to NRB mask, continue to wean per PCCM  2. Afib with RVR. Patient given IV cardizem but developed hypotension. On Esmolol but still developed hypotension. Now BP 80 systolic. This severely limits medical therapy to control rate. DCCV alone will not help since she would just go back into Afib with multiple stressors. Amiodarone appears to be the best option. Concern about history of iodine allergy but in speaking to the patient it is not clear what it was she had an allergic response to. This was years ago in [place]. While amiodarone does have a small amount of iodine review of literature does not show reaction to amiodarone is a population of patients with iodine allergy.  - back in sinus on IV amiodarone, remains NPO as she is on NRB mask.  - Can  transition to po amiodarone 400 mg daily once she is able to take po  - Would transition heparin ultimately to Eliquis per pharmacy once able to take po 3. Anemia  4. Minimally elevated troponin c/w sepsis syndrome 5. ARF creatinine improving.  6. H flu Bacteremia   Will continue to follow with you.  Chrystie Nose, MD, [facility], FACP  [place]  [facility]  Medical Director of the Advanced Lipid Disorders &  Cardiovascular Risk Reduction Clinic Diplomate of the American Board of Clinical Lipidology Attending Cardiologist  Direct Dial: [phone]  Fax: [phone]  Website:  [email or web address]  Chrystie Nose, MD  10/07/2017, 11:20 AM

## 2017-10-08 DIAGNOSIS — D6861 Antiphospholipid syndrome: Secondary | ICD-10-CM

## 2017-10-08 LAB — CBC
HEMATOCRIT: 20.1 % — AB (ref 36.0–46.0)
HEMOGLOBIN: 6.7 g/dL — AB (ref 12.0–15.0)
MCH: 32.1 pg (ref 26.0–34.0)
MCHC: 33.3 g/dL (ref 30.0–36.0)
MCV: 96.2 fL (ref 78.0–100.0)
Platelets: 253 10*3/uL (ref 150–400)
RBC: 2.09 MIL/uL — ABNORMAL LOW (ref 3.87–5.11)
RDW: 14.6 % (ref 11.5–15.5)
WBC: 23.8 10*3/uL — ABNORMAL HIGH (ref 4.0–10.5)

## 2017-10-08 LAB — BASIC METABOLIC PANEL
Anion gap: 8 (ref 5–15)
BUN: 90 mg/dL — AB (ref 6–20)
CHLORIDE: 106 mmol/L (ref 101–111)
CO2: 24 mmol/L (ref 22–32)
Calcium: 7 mg/dL — ABNORMAL LOW (ref 8.9–10.3)
Creatinine, Ser: 1.44 mg/dL — ABNORMAL HIGH (ref 0.44–1.00)
GFR calc Af Amer: 39 mL/min — ABNORMAL LOW (ref >60)
GFR calc non Af Amer: 34 mL/min — ABNORMAL LOW (ref >60)
Glucose, Bld: 165 mg/dL — ABNORMAL HIGH (ref 65–99)
POTASSIUM: 4.7 mmol/L (ref 3.5–5.1)
Sodium: 138 mmol/L (ref 135–145)

## 2017-10-08 LAB — OCCULT BLOOD X 1 CARD TO LAB, STOOL
Fecal Occult Bld: NEGATIVE
Fecal Occult Bld: POSITIVE — AB

## 2017-10-08 LAB — PREPARE RBC (CROSSMATCH)

## 2017-10-08 LAB — HEMOGLOBIN AND HEMATOCRIT, BLOOD
HEMATOCRIT: 27.8 % — AB (ref 36.0–46.0)
Hemoglobin: 9.1 g/dL — ABNORMAL LOW (ref 12.0–15.0)

## 2017-10-08 LAB — HEPARIN LEVEL (UNFRACTIONATED): HEPARIN UNFRACTIONATED: 0.85 [IU]/mL — AB (ref 0.30–0.70)

## 2017-10-08 MED ORDER — GUAIFENESIN ER 600 MG PO TB12
600.0000 mg | ORAL_TABLET | Freq: Two times a day (BID) | ORAL | Status: DC
  Administered 2017-10-08 – 2017-10-16 (×17): 600 mg via ORAL
  Filled 2017-10-08 (×17): qty 1

## 2017-10-08 MED ORDER — GUAIFENESIN-DM 100-10 MG/5ML PO SYRP
10.0000 mL | ORAL_SOLUTION | Freq: Four times a day (QID) | ORAL | Status: DC | PRN
  Filled 2017-10-08: qty 10

## 2017-10-08 MED ORDER — SODIUM CHLORIDE 0.9% FLUSH: 10.0000 mL | INTRAVENOUS | Status: DC | PRN

## 2017-10-08 MED ORDER — SODIUM CHLORIDE 0.9 % IV SOLN
80.0000 mg | Freq: Once | INTRAVENOUS | Status: AC
  Administered 2017-10-08: 80 mg via INTRAVENOUS
  Filled 2017-10-08: qty 80

## 2017-10-08 MED ORDER — CHLORHEXIDINE GLUCONATE CLOTH 2 % EX PADS
6.0000 | MEDICATED_PAD | Freq: Every day | CUTANEOUS | Status: DC
  Administered 2017-10-08 – 2017-10-16 (×8): 6 via TOPICAL

## 2017-10-08 MED ORDER — BISACODYL 10 MG RE SUPP
10.0000 mg | Freq: Once | RECTAL | Status: AC | PRN
  Administered 2017-10-08: 10 mg via RECTAL
  Filled 2017-10-08: qty 1

## 2017-10-08 MED ORDER — POLYETHYLENE GLYCOL 3350 17 G PO PACK: 17.0000 g | PACK | Freq: Every day | ORAL | Status: DC | PRN

## 2017-10-08 MED ORDER — SODIUM CHLORIDE 0.9 % IV SOLN
8.0000 mg/h | INTRAVENOUS | Status: DC
  Administered 2017-10-08 – 2017-10-10 (×4): 8 mg/h via INTRAVENOUS
  Filled 2017-10-08 (×5): qty 80

## 2017-10-08 MED ORDER — PANTOPRAZOLE SODIUM 40 MG IV SOLR: 40.0000 mg | Freq: Two times a day (BID) | INTRAVENOUS | Status: DC

## 2017-10-08 MED ORDER — ACETAMINOPHEN 325 MG PO TABS
325.0000 mg | ORAL_TABLET | Freq: Four times a day (QID) | ORAL | Status: DC | PRN
  Administered 2017-10-08: 325 mg via ORAL
  Administered 2017-10-10 – 2017-10-15 (×11): 650 mg via ORAL
  Filled 2017-10-08 (×9): qty 2
  Filled 2017-10-08: qty 1
  Filled 2017-10-08 (×2): qty 2

## 2017-10-08 MED ORDER — FUROSEMIDE 10 MG/ML IJ SOLN
40.0000 mg | Freq: Once | INTRAMUSCULAR | Status: AC
  Administered 2017-10-08: 40 mg via INTRAVENOUS
  Filled 2017-10-08: qty 4

## 2017-10-08 MED ORDER — SODIUM CHLORIDE 0.9% FLUSH
10.0000 mL | Freq: Two times a day (BID) | INTRAVENOUS | Status: DC
  Administered 2017-10-08 (×2): 10 mL
  Administered 2017-10-09: 20 mL
  Administered 2017-10-10: 18 mL
  Administered 2017-10-10 – 2017-10-12 (×4): 10 mL
  Administered 2017-10-12 – 2017-10-13 (×2): 20 mL
  Administered 2017-10-14 – 2017-10-15 (×3): 10 mL

## 2017-10-08 MED ORDER — SODIUM CHLORIDE 0.9 % IV SOLN
Freq: Once | INTRAVENOUS | Status: AC
  Administered 2017-10-08: 10:00:00 via INTRAVENOUS

## 2017-10-08 MED ORDER — METHYLPREDNISOLONE SODIUM SUCC 125 MG IJ SOLR
60.0000 mg | INTRAMUSCULAR | Status: DC
  Administered 2017-10-09 – 2017-10-12 (×4): 60 mg via INTRAVENOUS
  Filled 2017-10-08 (×4): qty 2

## 2017-10-08 MED ORDER — SODIUM CHLORIDE 0.9 % IV SOLN
Freq: Once | INTRAVENOUS | Status: AC
  Administered 2017-10-08: 15:00:00 via INTRAVENOUS

## 2017-10-08 MED ORDER — SENNOSIDES-DOCUSATE SODIUM 8.6-50 MG PO TABS
2.0000 | ORAL_TABLET | Freq: Every evening | ORAL | Status: DC | PRN
  Administered 2017-10-14: 2 via ORAL
  Filled 2017-10-08: qty 2

## 2017-10-08 MED ORDER — SORBITOL 70 % SOLN
30.0000 mL | Freq: Every day | Status: DC | PRN
Start: 2017-10-08 — End: 2017-10-16
  Administered 2017-10-08: 30 mL via ORAL
  Filled 2017-10-08 (×2): qty 30

## 2017-10-08 NOTE — Progress Notes (Signed)
Progress Note  Patient Name: Tonya Mcgee Date of Encounter: 10/08/2017  Primary Cardiologist: Ellis Parents ([place])  Subjective   Progressive anemia noted today, Hb <7. Plan for transfusion. On heparin. Would consider holding heparin at this point to allow further work-up. No BM in almost 1 week per nursing, may need to get FOBT via rectal exam. Short bursts of SVT overnight, but generally maintaining sinus on amiodarone. Confused - refusing bipap. Still on ventimask - NPO.  Inpatient Medications    Scheduled Meds: . budesonide (PULMICORT) nebulizer solution  0.5 mg Nebulization BID  . chlorhexidine  15 mL Mouth Rinse BID  . digoxin  0.125 mg Oral Daily  . feeding supplement  1 Container Oral TID BM  . hydroxychloroquine  400 mg Oral Daily  . levothyroxine  50 mcg Intravenous Daily  . mouth rinse  15 mL Mouth Rinse q12n4p  . methylPREDNISolone (SOLU-MEDROL) injection  40 mg Intravenous Q12H   Continuous Infusions: . sodium chloride    . amiodarone 30 mg/hr (10/08/17 0700)  . aztreonam Stopped (10/08/17 2751)  . dextrose 5 % and 0.45 % NaCl with KCl 20 mEq/L 100 mL/hr at 10/08/17 0700  . heparin 1,450 Units/hr (10/08/17 0700)   PRN Meds: albuterol, diphenhydrAMINE, fentaNYL (SUBLIMAZE) injection, ondansetron   Vital Signs    Vitals:   10/08/17 0500 10/08/17 0524 10/08/17 0600 10/08/17 0700  BP: (!) 147/67  99/69 122/64  Pulse: 80 79 71 74  Resp: 19 (!) 21 18 19   Temp:      TempSrc:      SpO2: 96% 100% 99% 96%  Weight:      Height:        Intake/Output Summary (Last 24 hours) at 10/08/2017 0802 Last data filed at 10/08/2017 0700 Gross per 24 hour  Intake 3815.87 ml  Output 1600 ml  Net 2215.87 ml   Filed Weights   10/05/17 0900  Weight: 209 lb 7 oz (95 kg)    Telemetry    Sinus rhythm, short bursts of SVT overnight - personally reviewed  ECG    None today  Physical Exam   GEN: awake, NAD on NRB mask Neck: No JVD Cardiac: RRR, no murmur Respiratory:  decreased BS bilaterally GI: Soft, nontender, non-distended  MS: No edema; No deformity. Neuro:  Nonfocal  Psych: Normal affect   Labs    Chemistry Recent Labs  Lab 10/05/17 1100 10/06/17 0328 10/06/17 0709 10/08/17 0425  NA 136 137 138 138  K 5.1 4.3 4.0 4.7  CL 104 105 104 106  CO2 16*  --  21* 24  GLUCOSE 102* 176* 145* 165*  BUN 65* 58* 77* 90*  CREATININE 2.32* 1.70* 1.85* 1.44*  CALCIUM 8.1*  --  7.3* 7.0*  PROT  --   --  5.5*  --   ALBUMIN  --   --  1.8*  --   AST  --   --  45*  --   ALT  --   --  31  --   ALKPHOS  --   --  94  --   BILITOT  --   --  0.6  --   GFRNONAA 19*  --  25* 34*  GFRAA 22*  --  29* 39*  ANIONGAP 16*  --  13 8     Hematology Recent Labs  Lab 10/07/17 0202 10/07/17 1225 10/08/17 0425  WBC 28.1* 26.7* 23.8*  RBC 2.35* 2.31* 2.09*  HGB 7.2* 7.3* 6.7*  HCT 22.4* 22.3* 20.1*  MCV 95.3 96.5 96.2  MCH 30.6 31.6 32.1  MCHC 32.1 32.7 33.3  RDW 14.4 14.7 14.6  PLT 249 240 253    Cardiac Enzymes Recent Labs  Lab 10/04/17 1408  TROPONINI 0.05*    Recent Labs  Lab 10/04/17 1006  TROPIPOC 0.04     BNP Recent Labs  Lab 10/04/17 0954 10/06/17 0655  BNP 417.6* 986.7*     DDimer No results for input(s): DDIMER in the last 168 hours.   Radiology    Dg Chest Port 1 View  Result Date: 10/07/2017 CLINICAL DATA:  Absent right-sided breath sounds. EXAM: PORTABLE CHEST 1 VIEW COMPARISON:  10/06/2017 FINDINGS: Interstitial airspace opacities in the right lung, predominantly in the upper lobe, and at the left lung base, are stable from the previous day's exam. Lung volumes are low. No new lung abnormalities. No convincing pleural effusion.  No pneumothorax. IMPRESSION: 1. No change in the interstitial airspace opacities, most evident in the right upper lobe, consistent with multifocal pneumonia. No new abnormalities. Electronically Signed   By: Amie Portland M.D.   On: 10/07/2017 17:21   [place] Ekg Site Rite  Result Date: 10/07/2017 If  Site Rite image not attached, placement could not be confirmed due to current cardiac rhythm.   Cardiac Studies   Echo 10/04/17: Study Conclusions  - Left ventricle: The cavity size was normal. Systolic function was   vigorous. The estimated ejection fraction was in the range of 65%   to 70%. Wall motion was normal; there were no regional wall   motion abnormalities. Doppler parameters are consistent with   abnormal left ventricular relaxation (grade 1 diastolic   dysfunction). Doppler parameters are consistent with high   ventricular filling pressure. - Aortic valve: Valve area (VTI): 2.57 cm^2. Valve area (Vmax):   2.64 cm^2. Valve area (Vmean): 2.35 cm^2. - Mitral valve: Calcified annulus. - Left atrium: The atrium was mildly dilated.  Impressions:  - Vigorous LV systolic function; mild diastolic dysfunction with   elevated LV filling pressure; mildly elevated LVOT velocity of   2.3 m/s likely related to vigorous LV function; mild LAE; trace   MR and TR.   Patient Profile     77 y.o. female with no prior cardiac history presents with acute respiratory failure and pulmonary infiltrates.   Assessment & Plan    1. Acute hypoxic respiratory failure secondary to multilobar PNA/ sepsis with Haemophilus influenzae. No evidence of CHF. Very vigorous LV function by echo. Elevated BNP due to RV strain with respiratory failure.   - weaned down to NRB mask, continue to wean per PCCM  2. Afib with RVR. Patient given IV cardizem but developed hypotension. On Esmolol but still developed hypotension. Now BP 80 systolic. This severely limits medical therapy to control rate. DCCV alone will not help since she would just go back into Afib with multiple stressors. Amiodarone appears to be the best option. Concern about history of iodine allergy but in speaking to the patient it is not clear what it was she had an allergic response to. This was years ago in [place]. While amiodarone does have a  small amount of iodine review of literature does not show reaction to amiodarone is a population of patients with iodine allergy.  - maintaining sinus for the most part on IV amidarone, continue  - progressive anemia overnight - will stop heparin. Transfuse per PCCM.  - decrease digoxin 3. Anemia   -progressively worse overnight plan to transfuse given hypoxia  -  hold IV Heparin  -will need work-up for bleeding, FOBT, ?GI consult - defer to PCCM 4. Minimally elevated troponin c/w sepsis syndrome 5. ARF creatinine improving.  6. H flu Bacteremia   Will continue to follow with you.  Chrystie Nose, MD, [facility], FACP  [place]  [facility]  Medical Director of the Advanced Lipid Disorders &  Cardiovascular Risk Reduction Clinic Diplomate of the American Board of Clinical Lipidology Attending Cardiologist  Direct Dial: [phone]  Fax: [phone]  Website:  [email or web address]  Chrystie Nose, MD  10/08/2017, 8:02 AM

## 2017-10-08 NOTE — Progress Notes (Signed)
Patient had large black BM, new hemoccult card sent and is positive for blood. Dr Roselyn Bering notified.

## 2017-10-08 NOTE — Progress Notes (Signed)
CRITICAL VALUE ALERT  Critical Value:  Hg 6.7  Date & Time Notied:  t 0520  Provider Notified: Dr Wray Kearns MD  Orders Received/Actions taken: Let day shift address after PICC placed

## 2017-10-08 NOTE — Progress Notes (Signed)
ANTICOAGULATION CONSULT NOTE   Pharmacy Consult for Heparin Indication: Antiphospholipid antibody syndrome / New Afib  Allergies  Allergen Reactions  . Iodine Hives    Tolerated amiodarone 09/2017 ANTISEPTICS & DISINFECTANTS Throat swelling  . Sulfa Antibiotics     Vomiting and diarrhea  . Amoxicillin Hives  . Ciprofloxacin Hives  . Codeine Other (See Comments) and Hives    Weird dreams  . Keflex  [Cephalexin]   . Penicillins Hives  . Pravastatin Nausea Only  . Tape     skin red irritated   Patient Measurements: Height: 5\' 2"  (157.5 cm) Weight: 209 lb 7 oz (95 kg) IBW/kg (Calculated) : 50.1 Heparin Dosing Weight: 72.3kg   Vital Signs: Temp: 97.2 F (36.2 C) (04/28 0803) Temp Source: Axillary (04/28 0803) BP: 139/70 (04/28 0800) Pulse Rate: 75 (04/28 0800)  Labs: Recent Labs    10/06/17 0328 10/06/17 0650  10/06/17 0709 10/07/17 0202 10/07/17 1225 10/08/17 0425  HGB 11.6*  --   --  7.7* 7.2* 7.3* 6.7*  HCT 34.0*  --   --  23.2* 22.4* 22.3* 20.1*  PLT  --   --    < > 276 249 240 253  APTT  --   --   --  52*  --   --   --   LABPROT  --   --   --  15.7*  --   --   --   INR  --   --   --  1.26  --   --   --   HEPARINUNFRC  --  0.40  --   --  0.24*  --  0.85*  CREATININE 1.70*  --   --  1.85*  --   --  1.44*   < > = values in this interval not displayed.   Estimated Creatinine Clearance: 34.6 mL/min (A) (by C-G formula based on SCr of 1.44 mg/dL (H)).  Assessment: 6 yoF female admitted on 10/04/2017 with sepsis. Patient has a history of positive antiphospholipid antibody syndrome. Not currently on anticoagulation PTA, but has been on warfarin in the past which she stopped taking for non-hemorrhagic reasons and has been stable on bASA for years.  New Afib this admit in setting of sepsis now in SR on amiodarone drip.  Heparin drip started but hgb slowly drifted down 8.8 > 6.7 - no bleeding noted but no stool since admit - hold heparin for today since in SR and  re-discuss in am and GI w/u   Goal of Therapy:  Heparin level 0.3-0.7 units/ml Monitor platelets by anticoagulation protocol: Yes   Plan:  Hold heparin for today Cbc in am  10/06/2017 Pharm.D. CPP, BCPS Clinical Pharmacist [phone] 10/08/2017 8:42 AM

## 2017-10-08 NOTE — Progress Notes (Addendum)
PULMONARY / CRITICAL CARE MEDICINE   Name: Tonya Mcgee MRN: [id number] DOB: 21-Jul-1940    ADMISSION DATE:  10/04/2017  CHIEF COMPLAINT:  Respiratory distress/H. influ CAP with bacteremia  HISTORY OF PRESENT ILLNESS:   This is a73 year old female with minimal past medical history other than hypertensionwho presented 4/24 with respiratory distress. According to her husband she had 1 day history of shortness of breath, congestion, orthopnea. She got out of bedthat eveningto go sleep in the recliner due to orthopnea and when husband awokeon the AM of admissionshe was minimally responsive. EMS placed her on BiPAP. In ER she had a labile blood pressure with SBP 60s at times. Normal WBC, afebrile, BNP 417, normal lactate.She has been maintained on BiPaP as she declines intubation unless she arrests. She denies prior OHD. Blood cultures are positive for H. Influ., for which she is on IV Zithromax and Azactam (beta-lactam allergy).  PAST MEDICAL HISTORY :  She  has a past medical history of History of chicken pox, History of measles, and History of mumps.  PAST SURGICAL HISTORY: She  has a past surgical history that includes Carpal tunnel release (Right, 1990); Knee surgery (Left, 1989); Appendectomy (1960); Tonsillectomy (1946); Carotid doppler ultrasound (11/18/2013); pap smear (2012); mammogram screening (2012); Bone Density Study (2011); and Colonoscopy Screening (2011).  Allergies  Allergen Reactions  . Iodine Hives    Tolerated amiodarone 09/2017 ANTISEPTICS & DISINFECTANTS Throat swelling  . Sulfa Antibiotics     Vomiting and diarrhea  . Amoxicillin Hives  . Ciprofloxacin Hives  . Codeine Other (See Comments) and Hives    Weird dreams  . Keflex  [Cephalexin]   . Penicillins Hives  . Pravastatin Nausea Only  . Tape     skin red irritated    No current facility-administered medications on file prior to encounter.    Current Outpatient Medications on File Prior to  Encounter  Medication Sig  . albuterol (PROVENTIL HFA;VENTOLIN HFA) 108 (90 Base) MCG/ACT inhaler Inhale 2 puffs into the lungs every 6 (six) hours as needed for wheezing or shortness of breath.  Marland Kitchen aspirin 81 MG tablet Take 1 tablet by mouth daily.  Marland Kitchen atorvastatin (LIPITOR) 10 MG tablet TAKE 1 TABLET DAILY  . Ferrous Sulfate (IRON) 325 (65 Fe) MG TABS Take 1 tablet by mouth daily.  . furosemide (LASIX) 20 MG tablet Take 1 tablet (20 mg total) by mouth daily.  . hydroxychloroquine (PLAQUENIL) 200 MG tablet Take 2 tablets by mouth daily.  Marland Kitchen levothyroxine (SYNTHROID, LEVOTHROID) 100 MCG tablet TAKE ONE TABLET DAILY  . lisinopril-hydrochlorothiazide (PRINZIDE,ZESTORETIC) 10-12.5 MG tablet TAKE 1 TABLET DAILY  . Multiple Vitamins-Minerals (MULTIVITAMIN ADULT PO) Take 1 tablet by mouth daily.  . Omega 3 1200 MG CAPS Take 1 capsule by mouth daily.  . cyclobenzaprine (FLEXERIL) 5 MG tablet Take 1 tablet (5 mg total) by mouth at bedtime.  . naproxen (NAPROSYN) 500 MG tablet Take 1 tablet (500 mg total) by mouth 2 (two) times daily as needed.  . traMADol (ULTRAM) 50 MG tablet Take 1 tablet (50 mg total) by mouth every 8 (eight) hours as needed.    FAMILY HISTORY:  Her indicated that her mother is deceased. She indicated that her father is deceased. She indicated that her sister is alive. She indicated that her brother is alive.   SOCIAL HISTORY: She  reports that she quit smoking about 39 years ago. Her smoking use included cigarettes. She has a 7.50 pack-year smoking history. She has never used smokeless tobacco. She  reports that she drinks alcohol. She reports that she does not use drugs.  SUBJECTIVE:  Slightly more SOB at moment following placement of PICC line  VITAL SIGNS: BP 139/69   Pulse 78   Temp (!) 97.2 F (36.2 C) (Axillary)   Resp (!) 21   Ht 5\' 2"  (1.575 m)   Wt 95 kg (209 lb 7 oz)   SpO2 100%   BMI 38.31 kg/m   INTAKE / OUTPUT: I/O last 3 completed shifts: In: 5761.1  [P.O.:360; I.V.:4551.1; IV Piggyback:850] Out: 2400 [Urine:2400]  PHYSICAL EXAMINATION: General:  WD/WN WF mild resp distress Neuro:  A&O. No focal deficits HEENT:  [place]/AT PERRL EOMI OP-neg Cardiovascular:  RRR, no m/r/g Lungs:  Crackles with inc BS posterior on right. Decreased BS R anteriorly; clear on left Abdomen:  Supple, NT, no masses +BS. Rectal w/o masses. Stool sent for hemoccult Musculoskeletal:  No active joints Skin:  No C/C/E  LABS:  BMET Recent Labs  Lab 10/05/17 1100 10/06/17 0328 10/06/17 0709 10/08/17 0425  NA 136 137 138 138  K 5.1 4.3 4.0 4.7  CL 104 105 104 106  CO2 16*  --  21* 24  BUN 65* 58* 77* 90*  CREATININE 2.32* 1.70* 1.85* 1.44*  GLUCOSE 102* 176* 145* 165*    Electrolytes Recent Labs  Lab 10/05/17 1100 10/06/17 0709 10/08/17 0425  CALCIUM 8.1* 7.3* 7.0*  MG 2.3 2.4  --   PHOS  --  5.3*  --     CBC Recent Labs  Lab 10/07/17 0202 10/07/17 1225 10/08/17 0425  WBC 28.1* 26.7* 23.8*  HGB 7.2* 7.3* 6.7*  HCT 22.4* 22.3* 20.1*  PLT 249 240 253    Coag's Recent Labs  Lab 10/04/17 1408 10/06/17 0709  APTT 22* 52*  INR 1.14 1.26    Sepsis Markers Recent Labs  Lab 10/04/17 1008 10/04/17 1408 10/04/17 1704 10/05/17 0710 10/06/17 0655 10/06/17 0709  LATICACIDVEN 1.71  --  1.3  --  1.7  --   PROCALCITON  --  15.94  --  23.83  --  16.63    ABG Recent Labs  Lab 10/05/17 0346 10/05/17 1226 10/06/17 0325  PHART 7.322* 7.339* 7.382  PCO2ART 31.6* 29.1* 31.6*  PO2ART 105.0 86.0 134.0*    Liver Enzymes Recent Labs  Lab 10/06/17 0709  AST 45*  ALT 31  ALKPHOS 94  BILITOT 0.6  ALBUMIN 1.8*    Cardiac Enzymes Recent Labs  Lab 10/04/17 1408  TROPONINI 0.05*    Glucose Recent Labs  Lab 10/06/17 0805 10/06/17 1150 10/06/17 1643 10/06/17 2351 10/07/17 0353 10/07/17 0733  GLUCAP 150* 148* 146* 130* 140* 156*    Imaging Dg Chest Port 1 View  Result Date: 10/07/2017 CLINICAL DATA:  Absent right-sided  breath sounds. EXAM: PORTABLE CHEST 1 VIEW COMPARISON:  10/06/2017 FINDINGS: Interstitial airspace opacities in the right lung, predominantly in the upper lobe, and at the left lung base, are stable from the previous day's exam. Lung volumes are low. No new lung abnormalities. No convincing pleural effusion.  No pneumothorax. IMPRESSION: 1. No change in the interstitial airspace opacities, most evident in the right upper lobe, consistent with multifocal pneumonia. No new abnormalities. Electronically Signed   By: Amie Portland M.D.   On: 10/07/2017 17:21   [place] Ekg Site Rite  Result Date: 10/07/2017 If Site Rite image not attached, placement could not be confirmed due to current cardiac rhythm.    STUDIES:  No recent  CULTURES: [facility] 4/26 NG  to date  ANTIBIOTICS: Zitrhomax - last dose today Aztreoman >5/7  SIGNIFICANT EVENTS: No recent  LINES/TUBES: PICC LUE  DISCUSSION: 77 year old female withno prior hx OHDpresentedto the ED with respiratory failure, hypoxemia, BiPAP and septic shock from a RLL PNA. Blood cultures pos for H influ. 4/26 BC neg to date  ASSESSMENT / PLAN:  PULMONARY A: H influ CAP with bacteremia. Tolerating NRM OK P:   Cont on NRM  CARDIOVASCULAR A:  AF with RVR. Maintained on Amiodarone P:  Continue as same  RENAL A:   Increasing BUN with dec Creat; lytes OK. Likely related to CS and +/- possible GIB P:   Trend BMP. Decrease SoluMedrol dose  GASTROINTESTINAL A:   Progressive anemia. R/o GIB P:   Stool sent for occult blood  HEMATOLOGIC A:   Declining H&H P:  Tx 2UPC  INFECTIOUS A:   H influ CAP P:   Aztreoman until 5/7  ENDOCRINE A:   Status   P:   Synthroid   Critical care time: 40 min  Pulmonary and Critical Care Medicine [facility] Pager: [phone]  10/08/2017, 10:11 AM   Addendum (17:25) Repeat stool for FOBT is positive. Believe the falling H&H that necessitated blood tx is related to GIB complicated  by previous heparin (which was held today) and systemic corticosteroids. Will start Protonix and have a call out to GI for consult.

## 2017-10-08 NOTE — Plan of Care (Signed)
Pt remains normothermic, Ordered antibiotics being administered.  Pt not tolerating BIPAP as ordered, Keeping O2 sats > 92 % on 15L NRB mask. Receiving nebs as ordered, cough remains congested and NP.  Pt respirations remain very tentative w/ dyspnea while repositioning in bed, cannot tolerated less than 30 degree recline.

## 2017-10-08 NOTE — Progress Notes (Signed)
ANTICOAGULATION CONSULT NOTE   Pharmacy Consult for Heparin Indication: Antiphospholipid antibody syndrome / New Afib  Allergies  Allergen Reactions  . Iodine Hives    Tolerated amiodarone 09/2017 ANTISEPTICS & DISINFECTANTS Throat swelling  . Sulfa Antibiotics     Vomiting and diarrhea  . Amoxicillin Hives  . Ciprofloxacin Hives  . Codeine Other (See Comments) and Hives    Weird dreams  . Keflex  [Cephalexin]   . Penicillins Hives  . Pravastatin Nausea Only  . Tape     skin red irritated   Patient Measurements: Height: 5\' 2"  (157.5 cm) Weight: 209 lb 7 oz (95 kg) IBW/kg (Calculated) : 50.1 Heparin Dosing Weight: 72.3kg   Vital Signs: Temp: 97.7 F (36.5 C) (04/28 0300) Temp Source: Axillary (04/28 0300) BP: 147/67 (04/28 0500) Pulse Rate: 79 (04/28 0524)  Labs: Recent Labs    10/06/17 0328 10/06/17 0650  10/06/17 0709 10/07/17 0202 10/07/17 1225 10/08/17 0425  HGB 11.6*  --   --  7.7* 7.2* 7.3* 6.7*  HCT 34.0*  --   --  23.2* 22.4* 22.3* 20.1*  PLT  --   --    < > 276 249 240 253  APTT  --   --   --  52*  --   --   --   LABPROT  --   --   --  15.7*  --   --   --   INR  --   --   --  1.26  --   --   --   HEPARINUNFRC  --  0.40  --   --  0.24*  --  0.85*  CREATININE 1.70*  --   --  1.85*  --   --  1.44*   < > = values in this interval not displayed.   Estimated Creatinine Clearance: 34.6 mL/min (A) (by C-G formula based on SCr of 1.44 mg/dL (H)).  Assessment: 77 y.o. female with Afib for heparin   Goal of Therapy:  Heparin level 0.3-0.7 units/ml Monitor platelets by anticoagulation protocol: Yes   Plan:  Decrease Heparin 1450 units/hr Check heparin level in 8 hours.  70, PharmD, BCPS  10/08/2017 5:44 AM

## 2017-10-08 NOTE — Progress Notes (Signed)
Pharmacy Antibiotic Note  Tonya Mcgee is a 77 y.o. female admitted on 10/04/2017 with sepsis, PNA.  Pharmacy had been consulted for meropenem/vancomycin dosing.  BCID grew H.Flu and ABX changed to aztreonam and azithromycin.   Currently Afebrile, WBC elevated 23 but also on methylprednisone, LA 1.7, PCT drop 23> 16 Cr improved 2.2>1.4   Plan: Azithromycin x5 days - last dose today Aztreonam 1gm iv q8h - 4/24> (5/7)  Height: 5\' 2"  (157.5 cm) Weight: 209 lb 7 oz (95 kg) IBW/kg (Calculated) : 50.1  Temp (24hrs), Avg:98 F (36.7 C), Min:97.2 F (36.2 C), Max:98.6 F (37 C)  Recent Labs  Lab 10/04/17 1008 10/04/17 1704 10/05/17 0534 10/05/17 0710 10/05/17 1100 10/06/17 0328 10/06/17 0655 10/06/17 0709 10/07/17 0202 10/07/17 1225 10/08/17 0425  WBC  --   --  23.8*  --   --   --   --  22.6* 28.1* 26.7* 23.8*  CREATININE  --   --   --  2.41* 2.32* 1.70*  --  1.85*  --   --  1.44*  LATICACIDVEN 1.71 1.3  --   --   --   --  1.7  --   --   --   --     Estimated Creatinine Clearance: 34.6 mL/min (A) (by C-G formula based on SCr of 1.44 mg/dL (H)).    Allergies  Allergen Reactions  . Iodine Hives    Tolerated amiodarone 09/2017 ANTISEPTICS & DISINFECTANTS Throat swelling  . Sulfa Antibiotics     Vomiting and diarrhea  . Amoxicillin Hives  . Ciprofloxacin Hives  . Codeine Other (See Comments) and Hives    Weird dreams  . Keflex  [Cephalexin]   . Penicillins Hives  . Pravastatin Nausea Only  . Tape     skin red irritated    10/2017 Pharm.D. CPP, BCPS Clinical Pharmacist [phone] 10/08/2017 8:51 AM

## 2017-10-08 NOTE — Progress Notes (Signed)
Peripherally Inserted Central Catheter/Midline Placement  The IV Nurse has discussed with the patient and/or persons authorized to consent for the patient, the purpose of this procedure and the potential benefits and risks involved with this procedure.  The benefits include less needle sticks, lab draws from the catheter, and the patient may be discharged home with the catheter. Risks include, but not limited to, infection, bleeding, blood clot (thrombus formation), and puncture of an artery; nerve damage and irregular heartbeat and possibility to perform a PICC exchange if needed/ordered by physician.  Alternatives to this procedure were also discussed.  Bard Power PICC patient education guide, fact sheet on infection prevention and patient information card has been provided to patient /or left at bedside.    PICC/Midline Placement Documentation  PICC Double Lumen 10/08/17 PICC Right Basilic 43 cm 0 cm (Active)  Indication for Insertion or Continuance of Line Vasoactive infusions 10/08/2017  9:32 AM  Exposed Catheter (cm) 0 cm 10/08/2017  9:32 AM  Site Assessment Clean;Dry;Intact 10/08/2017  9:32 AM  Lumen #1 Status Flushed;Saline locked;Blood return noted 10/08/2017  9:32 AM  Lumen #2 Status Flushed;Saline locked;Blood return noted 10/08/2017  9:32 AM  Dressing Type Transparent 10/08/2017  9:32 AM  Dressing Status Clean;Dry;Intact;Antimicrobial disc in place 10/08/2017  9:32 AM  Line Care Connections checked and tightened 10/08/2017  9:32 AM  Line Adjustment (NICU/IV Team Only) No 10/08/2017  9:32 AM  Dressing Intervention New dressing 10/08/2017  9:32 AM  Dressing Change Due 10/15/17 10/08/2017  9:32 AM       Elliot Dally 10/08/2017, 9:34 AM

## 2017-10-09 ENCOUNTER — Encounter (HOSPITAL_COMMUNITY): Payer: Self-pay | Admitting: *Deleted

## 2017-10-09 DIAGNOSIS — D62 Acute posthemorrhagic anemia: Secondary | ICD-10-CM

## 2017-10-09 DIAGNOSIS — I4891 Unspecified atrial fibrillation: Secondary | ICD-10-CM

## 2017-10-09 DIAGNOSIS — J9621 Acute and chronic respiratory failure with hypoxia: Secondary | ICD-10-CM

## 2017-10-09 LAB — BPAM RBC
BLOOD PRODUCT EXPIRATION DATE: 201905202359
Blood Product Expiration Date: 201905232359
ISSUE DATE / TIME: 201904280958
ISSUE DATE / TIME: 201904281434
UNIT TYPE AND RH: 6200
Unit Type and Rh: 6200

## 2017-10-09 LAB — CBC
HCT: 27.3 % — ABNORMAL LOW (ref 36.0–46.0)
HEMOGLOBIN: 9 g/dL — AB (ref 12.0–15.0)
MCH: 30.9 pg (ref 26.0–34.0)
MCHC: 33 g/dL (ref 30.0–36.0)
MCV: 93.8 fL (ref 78.0–100.0)
PLATELETS: 228 10*3/uL (ref 150–400)
RBC: 2.91 MIL/uL — AB (ref 3.87–5.11)
RDW: 15.8 % — ABNORMAL HIGH (ref 11.5–15.5)
WBC: 25.9 10*3/uL — ABNORMAL HIGH (ref 4.0–10.5)

## 2017-10-09 LAB — TYPE AND SCREEN
ABO/RH(D): A POS
Antibody Screen: NEGATIVE
Unit division: 0
Unit division: 0

## 2017-10-09 LAB — BASIC METABOLIC PANEL
Anion gap: 8 (ref 5–15)
BUN: 82 mg/dL — ABNORMAL HIGH (ref 6–20)
CHLORIDE: 108 mmol/L (ref 101–111)
CO2: 24 mmol/L (ref 22–32)
CREATININE: 1.26 mg/dL — AB (ref 0.44–1.00)
Calcium: 7.2 mg/dL — ABNORMAL LOW (ref 8.9–10.3)
GFR calc Af Amer: 46 mL/min — ABNORMAL LOW (ref >60)
GFR calc non Af Amer: 40 mL/min — ABNORMAL LOW (ref >60)
GLUCOSE: 153 mg/dL — AB (ref 65–99)
POTASSIUM: 4.8 mmol/L (ref 3.5–5.1)
Sodium: 140 mmol/L (ref 135–145)

## 2017-10-09 LAB — GLUCOSE, CAPILLARY: Glucose-Capillary: 143 mg/dL — ABNORMAL HIGH (ref 65–99)

## 2017-10-09 MED ORDER — LEVOTHYROXINE SODIUM 100 MCG PO TABS
100.0000 ug | ORAL_TABLET | Freq: Every day | ORAL | Status: DC
  Administered 2017-10-09 – 2017-10-16 (×8): 100 ug via ORAL
  Filled 2017-10-09 (×8): qty 1

## 2017-10-09 MED ORDER — AMIODARONE HCL 200 MG PO TABS
400.0000 mg | ORAL_TABLET | Freq: Two times a day (BID) | ORAL | Status: DC
  Administered 2017-10-09 – 2017-10-16 (×14): 400 mg via ORAL
  Filled 2017-10-09 (×14): qty 2

## 2017-10-09 NOTE — Progress Notes (Signed)
eLink Physician-Brief Progress Note Patient Name: Tonya Mcgee DOB: [birth year] MRN: [id number]   Date of Service  10/09/2017  HPI/Events of Note  Notified of need for DVT prophylaxis. Heparin IV infusion stopped d/t large black stool and drop in hgb.  eICU Interventions  Will order SCD's placed on bilateral lower extremities.      Intervention Category Intermediate Interventions: Best-practice therapies (e.g. DVT, beta blocker, etc.)  [name] 10/09/2017, 6:17 AM

## 2017-10-09 NOTE — Consult Note (Addendum)
[name]: 9:17 AM 10/09/2017  LOS: 5 days    Referring Provider: Dr Molli Knock  Primary Care Physician:  Malva Limes, MD in [facility] Primary Gastroenterologist:  unassigned     Reason for Consultation:  Anemia, melena.   IMPRESSION:   *   Acute GIB in setting of sepsis, H Flu PNA, new onset Afib and Heparin.   She takes Naprosyn regularly so NSAID induced ulcer versus PUD versus AVMs are high on the list as to causes.  *  ABL on top of chronic anemia.  Hgb improved after 2 U PRBCs.    *   H. Influenzae CAP with hypoxic respiratory failure.  She remains quite ill with respiratory compromise.  WBCs remain elevated.  *  AKI, improved.    *    New onset A. fib with RVR, currently not tachycardic and in sinus rhythm   PLAN:     *   EGD recommended but would like the patient seen by Dr. Leone Payor before deciding on timing.  He proceeds with EGD today, the patient may require intubation for the procedure.    In the meantime continue the IV Protonix.  Fortunately the A. fib is currently resolved so the issue of heparin drip is not pressing and will remain off.     Jennye Moccasin  10/09/2017, 9:17 AM Phone [phone]      [facility] Attending   I have taken an interval history, reviewed the chart and examined the patient. I agree with the Advanced Practitioner's note, impression and recommendations.   Following change: Would avoid EGD unless needed for therapy given tenuous respiratory status  Continue medical tx as you are and stay off anticoagulation Sedation likely = intubation which she does not want and she is improved from yesterday re: GI bleeding and hemodynamically stable  Will follow - can consider diagnostic EGD later depending upon clinical course - reg NSAID use probably  gives [place] the answer - ulcers/gastritis very likely  Iva Boop, MD, [facility] 10/09/2017 11:31 AM    HPI: Tonya Mcgee is a 77 y.o. female.  PMH Hypothyroidism.  HTN.  DJD, DDD, O/A.  Chronic anemia on 1 x daily iron at home.   RA, on Plaquenil and Naproxen at home.  Colonoscopy in 2011 in [place], she states it was unremarkable.  No previous EGD.    Admitted with multilobar CAP, H Flu bacteremia, sepsis, hypoxic resp failure, Afib with RVR, AKI.  Anemia noted.  FOBT +.  Large black stool ~ 6 PM 4/28.   Heparin bolus/infusion 4/25, drip stopped 4/28 at 0815.   Protonix drip started 4/28 at 2000.    Hgb 8.8 >> 7.7 >> 7.2 >> 6.7 >> 2 U PRBCS on 4/28 >> 9.0. Was 8.6 in 01/2017.     MCV mid 90s. WBCs 25 K PT/INR/PTT: 15.7/1.2/0.85  resp status still acute, desats easily according to resp therapist.    At home takes >= 8 Aleve per week.  Stools daily, and dark due to po iron.  Good appetite.  Stable weight.  No dysphagia.  No blood per rectum.  No prior blood transfusions.  Family history notable for anemia in siblings.  She does at least 1 of her siblings have pernicious anemia but thinks there are other types of been running in the family.       Past Medical History:  Diagnosis Date  . Anemia    Suspect this is anemia of chronic disease.  Takes once daily iron as of 09/2017  . History of chicken pox   . History of measles   . History of mumps   . Hypertension   . Rheumatoid arthritis (HCC)    takes Plaquenil as of 09/2017    Past Surgical History:  Procedure Laterality Date  . APPENDECTOMY  1960  . Bone Density Study  2011   within normal limits per patient report  . Carotid doppler ultrasound  11/18/2013   mild bilateral plaque formation. No hemodynamically significant stenosis  . CARPAL TUNNEL RELEASE Right 1990  . Colonoscopy Screening  2011   normal per patient report  . KNEE SURGERY Left 1989   arthroscopic  . mammogram screening  2012     normal per patient  . pap smear  2012   normal per patient  . TONSILLECTOMY  1946    Prior to Admission medications   Medication Sig Start Date End Date Taking? Authorizing Provider  albuterol (PROVENTIL HFA;VENTOLIN HFA) 108 (90 Base) MCG/ACT inhaler Inhale 2 puffs into the lungs every 6 (six) hours as needed for wheezing or shortness of breath. 12/16/16  Yes Malva Limes, MD  aspirin 81 MG tablet Take 1 tablet by mouth daily.   Yes [provider]  atorvastatin (LIPITOR) 10 MG tablet TAKE 1 TABLET DAILY 08/14/17  Yes Malva Limes, MD  Ferrous Sulfate (IRON) 325 (65 Fe) MG TABS Take 1 tablet by mouth daily. 11/16/13  Yes [provider]  furosemide (LASIX) 20 MG tablet Take 1 tablet (20 mg total) by mouth daily. 02/06/17  Yes Malva Limes, MD  hydroxychloroquine (PLAQUENIL) 200 MG tablet Take 2 tablets by mouth daily. 11/16/13  Yes [provider]  levothyroxine (SYNTHROID, LEVOTHROID) 100 MCG tablet TAKE ONE TABLET DAILY 08/20/17  Yes Malva Limes, MD  lisinopril-hydrochlorothiazide (PRINZIDE,ZESTORETIC) 10-12.5 MG tablet TAKE 1 TABLET DAILY 07/08/17  Yes Malva Limes, MD  Multiple Vitamins-Minerals (MULTIVITAMIN ADULT PO) Take 1 tablet by mouth daily.   Yes [provider]  Omega 3 1200 MG CAPS Take 1 capsule by mouth daily.   Yes [provider]  cyclobenzaprine (FLEXERIL) 5 MG tablet Take 1 tablet (5 mg total) by mouth at bedtime. 02/06/17   Malva Limes, MD  naproxen (NAPROSYN) 500 MG tablet Take 1 tablet (500 mg total) by mouth 2 (two) times daily as needed. 10/06/15   Malva Limes, MD  traMADol (ULTRAM) 50 MG tablet Take 1 tablet (50 mg total) by mouth every 8 (eight) hours as needed. 02/06/17   Malva Limes, MD    Scheduled Meds: . amiodarone  400 mg Oral BID  . budesonide (PULMICORT) nebulizer solution  0.5 mg Nebulization BID  . chlorhexidine  15 mL Mouth Rinse BID  . Chlorhexidine Gluconate Cloth  6 each Topical  Daily  . feeding supplement  1 Container Oral TID BM  . guaiFENesin  600 mg Oral BID  . hydroxychloroquine  400 mg Oral Daily  . levothyroxine  100 mcg Oral QAC breakfast  . mouth  rinse  15 mL Mouth Rinse q12n4p  . methylPREDNISolone (SOLU-MEDROL) injection  60 mg Intravenous Q24H  . [START ON 10/12/2017] pantoprazole  40 mg Intravenous Q12H  . sodium chloride flush  10-40 mL Intracatheter Q12H   Infusions: . amiodarone 30 mg/hr (10/09/17 0800)  . aztreonam Stopped (10/09/17 0537)  . dextrose 5 % and 0.45 % NaCl with KCl 20 mEq/L 50 mL/hr at 10/09/17 0800  . pantoprozole (PROTONIX) infusion 8 mg/hr (10/09/17 0800)   PRN Meds: acetaminophen, albuterol, diphenhydrAMINE, fentaNYL (SUBLIMAZE) injection, ondansetron, polyethylene glycol, senna-docusate, sodium chloride flush, sorbitol   Allergies as of 10/04/2017 - Review Complete 10/04/2017  Allergen Reaction Noted  . Iodine Hives 10/25/2013  . Sulfa antibiotics  03/02/2017  . Amoxicillin Hives 07/15/2015  . Ciprofloxacin Hives 07/15/2015  . Codeine Other (See Comments) and Hives 10/25/2013  . Keflex  [cephalexin]  07/15/2015  . Penicillins Hives 10/25/2013  . Pravastatin Nausea Only 07/16/2015  . Tape  07/15/2015    Family History  Problem Relation Age of Onset  . Congestive Heart Failure Mother   . Liver cancer Father   . Depression Sister     Social History   Social History Narrative   Married moved here from [place]   Lives in [place] in [place]   Retired RN   Occ EtOH   Prior smoker     REVIEW OF SYSTEMS: Constitutional: Feels weak, tired. ENT:  No nose bleeds Pulm: Weak cough.  Not producing much sputum.  Short of breath if the oxygen is off.  Nonrebreather mask in place.  At home she does not have trouble breathing. CV:  No palpitations, no LE edema.  GU:  No hematuria, no frequency GI:  Per HPI Heme: Generally has no problems with unusual or excessive bleeding or bruising.  Since admission she has  required numerous bruises from phlebotomy/IV sites. Transfusions: None in the past. Neuro:  No headaches, no peripheral tingling or numbness MS: Because of arthritis, she uses a walker for ambulation, even in the house Derm:  No itching, no rash or sores.  Endocrine:  No sweats or chills.  No polyuria or dysuria Immunization: Did not ask previous or recent vaccinations. Travel:  None beyond local counties in last few months.    PHYSICAL EXAM: Vital signs in last 24 hours: Vitals:   10/09/17 0803 10/09/17 0847  BP:    Pulse:    Resp:    Temp: (!) 97.5 F (36.4 C)   SpO2:  99%   Wt Readings from Last 3 Encounters:  10/05/17 209 lb 7 oz (95 kg)  03/02/17 201 lb 12.8 oz (91.5 kg)  10/31/16 215 lb (97.5 kg)    General: Weak, pale, unwell appearing but not toxic elderly WF Head: No facial asymmetry or swelling.  No signs of head trauma.  No scleral icterus.  No conjunctival pallor. Eyes: No scleral icterus.  No conjunctival pallor. Ears: Not hard of hearing Nose: No discharge or congestion Mouth: Tongue midline.  Oral mucosa clear, slightly dry. Neck: No JVD, no masses, no thyromegaly Lungs: Rhonchi bilaterally.  Tight, phlegmy, cough.   Heart: RRR.  No MRG.  S1, S2 present. Abdomen: Soft.  Obese. NT, ND.   Bowel sounds normal but hypoactive.  No HSM, masses, bruits.  Appendectomy scar in the left lower abdomen..   Rectal: Deferred. Musc/Skeltl: No joint redness or swelling. Extremities: No CCE. Neurologic: Although tired, easy to awaken.  Moves all 4 limbs.  No tremor.  Limb  strength not tested.  No gross neurologic deficits. Skin: No rash, no sores.  Various bruises, none larger than a lime.   Tattoos:  none Nodes: No cervical adenopathy. Psych: Cooperative.  Affect flat consistent with illness.  Intake/Output from previous day: 04/28 0701 - 04/29 0700 In: 3927.8 [P.O.:420; I.V.:2477.8; Blood:630; IV Piggyback:400] Out: 2250 [Urine:2250] Intake/Output this shift: Total  I/O In: 91.7 [I.V.:91.7] Out: 450 [Urine:450]  LAB RESULTS: Recent Labs    10/07/17 1225 10/08/17 0425 10/08/17 2004 10/09/17 0445  WBC 26.7* 23.8*  --  25.9*  HGB 7.3* 6.7* 9.1* 9.0*  HCT 22.3* 20.1* 27.8* 27.3*  PLT 240 253  --  228   BMET Lab Results  Component Value Date   NA 140 10/09/2017   NA 138 10/08/2017   NA 138 10/06/2017   K 4.8 10/09/2017   K 4.7 10/08/2017   K 4.0 10/06/2017   CL 108 10/09/2017   CL 106 10/08/2017   CL 104 10/06/2017   CO2 24 10/09/2017   CO2 24 10/08/2017   CO2 21 (L) 10/06/2017   GLUCOSE 153 (H) 10/09/2017   GLUCOSE 165 (H) 10/08/2017   GLUCOSE 145 (H) 10/06/2017   BUN 82 (H) 10/09/2017   BUN 90 (H) 10/08/2017   BUN 77 (H) 10/06/2017   CREATININE 1.26 (H) 10/09/2017   CREATININE 1.44 (H) 10/08/2017   CREATININE 1.85 (H) 10/06/2017   CALCIUM 7.2 (L) 10/09/2017   CALCIUM 7.0 (L) 10/08/2017   CALCIUM 7.3 (L) 10/06/2017   LFT No results for input(s): PROT, ALBUMIN, AST, ALT, ALKPHOS, BILITOT, BILIDIR, IBILI in the last 72 hours. PT/INR Lab Results  Component Value Date   INR 1.26 10/06/2017   INR 1.14 10/04/2017   Drugs of Abuse     Component Value Date/Time   LABOPIA NONE DETECTED 10/04/2017 1753   COCAINSCRNUR NONE DETECTED 10/04/2017 1753   LABBENZ NONE DETECTED 10/04/2017 1753   AMPHETMU NONE DETECTED 10/04/2017 1753   THCU NONE DETECTED 10/04/2017 1753   LABBARB NONE DETECTED 10/04/2017 1753     RADIOLOGY STUDIES: Dg Chest Port 1 View  Result Date: 10/07/2017 CLINICAL DATA:  Absent right-sided breath sounds. EXAM: PORTABLE CHEST 1 VIEW COMPARISON:  10/06/2017 FINDINGS: Interstitial airspace opacities in the right lung, predominantly in the upper lobe, and at the left lung base, are stable from the previous day's exam. Lung volumes are low. No new lung abnormalities. No convincing pleural effusion.  No pneumothorax. IMPRESSION: 1. No change in the interstitial airspace opacities, most evident in the right upper lobe,  consistent with multifocal pneumonia. No new abnormalities. Electronically Signed   By: Amie Portland M.D.   On: 10/07/2017 17:21   [place] Ekg Site Rite  Result Date: 10/07/2017 If Site Rite image not attached, placement could not be confirmed due to current cardiac rhythm.

## 2017-10-09 NOTE — Plan of Care (Signed)
  Problem: Clinical Measurements: Goal: Ability to maintain clinical measurements within normal limits will improve Outcome: Progressing Goal: Respiratory complications will improve Outcome: Progressing Note:  Trial off NRB this AM and attempting HFNC instead.    Problem: Elimination: Goal: Will not experience complications related to bowel motility Outcome: Progressing Goal: Will not experience complications related to urinary retention Outcome: Progressing   Problem: Pain Managment: Goal: General experience of comfort will improve Outcome: Progressing

## 2017-10-09 NOTE — Progress Notes (Signed)
Progress Note  Patient Name: Tonya Mcgee Date of Encounter: 10/09/2017  Primary Cardiologist: No primary care provider on file.   Subjective   Feeling better.  No chest pain.  Breathing is improving.   Inpatient Medications    Scheduled Meds: . budesonide (PULMICORT) nebulizer solution  0.5 mg Nebulization BID  . chlorhexidine  15 mL Mouth Rinse BID  . Chlorhexidine Gluconate Cloth  6 each Topical Daily  . feeding supplement  1 Container Oral TID BM  . guaiFENesin  600 mg Oral BID  . hydroxychloroquine  400 mg Oral Daily  . levothyroxine  50 mcg Intravenous Daily  . mouth rinse  15 mL Mouth Rinse q12n4p  . methylPREDNISolone (SOLU-MEDROL) injection  60 mg Intravenous Q24H  . [START ON 10/12/2017] pantoprazole  40 mg Intravenous Q12H  . sodium chloride flush  10-40 mL Intracatheter Q12H   Continuous Infusions: . amiodarone 30 mg/hr (10/09/17 0800)  . aztreonam Stopped (10/09/17 0537)  . dextrose 5 % and 0.45 % NaCl with KCl 20 mEq/L 50 mL/hr at 10/09/17 0800  . pantoprozole (PROTONIX) infusion 8 mg/hr (10/09/17 0800)   PRN Meds: acetaminophen, albuterol, diphenhydrAMINE, fentaNYL (SUBLIMAZE) injection, ondansetron, polyethylene glycol, senna-docusate, sodium chloride flush, sorbitol   Vital Signs    Vitals:   10/09/17 0600 10/09/17 0700 10/09/17 0800 10/09/17 0803  BP: (!) 148/72 (!) 160/128 136/68   Pulse: 75 77 72   Resp: 20 17 (!) 23   Temp:    (!) 97.5 F (36.4 C)  TempSrc:    Oral  SpO2: 100% (!) 89% 97%   Weight:      Height:        Intake/Output Summary (Last 24 hours) at 10/09/2017 5625 Last data filed at 10/09/2017 0800 Gross per 24 hour  Intake 3884.96 ml  Output 2500 ml  Net 1384.96 ml   Filed Weights   10/05/17 0900  Weight: 209 lb 7 oz (95 kg)    Telemetry    Sinus rhythm, PAC's, no recurrent afib - Personally Reviewed   Physical Exam  Elderly woman, NAD on O2 per facemask GEN: No acute distress.   Neck: Mild JVD Cardiac: RRR, no  murmurs, rubs, or gallops.  Respiratory: Clear anteriorly GI: Soft, nontender, non-distended  MS: No edema; No deformity. Neuro:  Nonfocal  Psych: Normal affect   Labs    Chemistry Recent Labs  Lab 10/06/17 0709 10/08/17 0425 10/09/17 0445  NA 138 138 140  K 4.0 4.7 4.8  CL 104 106 108  CO2 21* 24 24  GLUCOSE 145* 165* 153*  BUN 77* 90* 82*  CREATININE 1.85* 1.44* 1.26*  CALCIUM 7.3* 7.0* 7.2*  PROT 5.5*  --   --   ALBUMIN 1.8*  --   --   AST 45*  --   --   ALT 31  --   --   ALKPHOS 94  --   --   BILITOT 0.6  --   --   GFRNONAA 25* 34* 40*  GFRAA 29* 39* 46*  ANIONGAP 13 8 8      Hematology Recent Labs  Lab 10/07/17 1225 10/08/17 0425 10/08/17 2004 10/09/17 0445  WBC 26.7* 23.8*  --  25.9*  RBC 2.31* 2.09*  --  2.91*  HGB 7.3* 6.7* 9.1* 9.0*  HCT 22.3* 20.1* 27.8* 27.3*  MCV 96.5 96.2  --  93.8  MCH 31.6 32.1  --  30.9  MCHC 32.7 33.3  --  33.0  RDW 14.7 14.6  --  15.8*  PLT 240 253  --  228    Cardiac Enzymes Recent Labs  Lab 10/04/17 1408  TROPONINI 0.05*    Recent Labs  Lab 10/04/17 1006  TROPIPOC 0.04     BNP Recent Labs  Lab 10/04/17 0954 10/06/17 0655  BNP 417.6* 986.7*     DDimer No results for input(s): DDIMER in the last 168 hours.   Radiology    Dg Chest Port 1 View  Result Date: 10/07/2017 CLINICAL DATA:  Absent right-sided breath sounds. EXAM: PORTABLE CHEST 1 VIEW COMPARISON:  10/06/2017 FINDINGS: Interstitial airspace opacities in the right lung, predominantly in the upper lobe, and at the left lung base, are stable from the previous day's exam. Lung volumes are low. No new lung abnormalities. No convincing pleural effusion.  No pneumothorax. IMPRESSION: 1. No change in the interstitial airspace opacities, most evident in the right upper lobe, consistent with multifocal pneumonia. No new abnormalities. Electronically Signed   By: Amie Portland M.D.   On: 10/07/2017 17:21   [place] Ekg Site Rite  Result Date: 10/07/2017 If Site  Rite image not attached, placement could not be confirmed due to current cardiac rhythm.   Cardiac Studies   Echo 10-04-2017: Study Conclusions  - Left ventricle: The cavity size was normal. Systolic function was   vigorous. The estimated ejection fraction was in the range of 65%   to 70%. Wall motion was normal; there were no regional wall   motion abnormalities. Doppler parameters are consistent with   abnormal left ventricular relaxation (grade 1 diastolic   dysfunction). Doppler parameters are consistent with high   ventricular filling pressure. - Aortic valve: Valve area (VTI): 2.57 cm^2. Valve area (Vmax):   2.64 cm^2. Valve area (Vmean): 2.35 cm^2. - Mitral valve: Calcified annulus. - Left atrium: The atrium was mildly dilated.  Impressions:  - Vigorous LV systolic function; mild diastolic dysfunction with   elevated LV filling pressure; mildly elevated LVOT velocity of   2.3 m/s likely related to vigorous LV function; mild LAE; trace   MR and TR.  Patient Profile     77 y.o. female with acute respiratory failure who developed AF with RVR, now converted to sinus rhythm on IV amiodarone  Assessment & Plan    Atrial fibrillation with RVR: The patient initially was treated with IV diltiazem and esmolol but developed hypotension with both agents.  She converted to sinus rhythm on IV amiodarone.  Will complete the current bag of IV amiodarone which she has now been on for about 72 hours and then convert her to amiodarone 400 mg twice daily orally.  Hold on anticoagulation because of progressive anemia and Hemoccult positive stool.  Hemoglobin has dropped below 7 mg/dL and she required transfusion of packed red blood cells.  We will follow with you.  For questions or updates, please contact [facility] Please consult [email or web address] for contact info under Cardiology/STEMI.      Signed, Tonny Bollman, MD  10/09/2017, 8:21 AM

## 2017-10-09 NOTE — Plan of Care (Signed)
  Problem: Clinical Measurements: Goal: Will remain free from infection Outcome: Progressing Goal: Diagnostic test results will improve Outcome: Progressing Goal: Cardiovascular complication will be avoided Outcome: Progressing   Problem: Elimination: Goal: Will not experience complications related to bowel motility Outcome: Progressing Goal: Will not experience complications related to urinary retention Outcome: Progressing   Problem: Safety: Goal: Ability to remain free from injury will improve Outcome: Progressing   Problem: Skin Integrity: Goal: Risk for impaired skin integrity will decrease Outcome: Progressing   Problem: Clinical Measurements: Goal: Ability to maintain a body temperature in the normal range will improve Outcome: Progressing   Problem: Education: Goal: Knowledge of General Education information will improve Outcome: Not Progressing   Problem: Health Behavior/Discharge Planning: Goal: Ability to manage health-related needs will improve Outcome: Not Progressing   Problem: Clinical Measurements: Goal: Ability to maintain clinical measurements within normal limits will improve Outcome: Not Progressing Goal: Respiratory complications will improve Outcome: Not Progressing   Problem: Activity: Goal: Risk for activity intolerance will decrease Outcome: Not Progressing   Problem: Nutrition: Goal: Adequate nutrition will be maintained Outcome: Not Progressing   Problem: Coping: Goal: Level of anxiety will decrease Outcome: Not Progressing   Problem: Pain Managment: Goal: General experience of comfort will improve Outcome: Not Progressing   Problem: Activity: Goal: Ability to tolerate increased activity will improve Outcome: Not Progressing   Problem: Respiratory: Goal: Ability to maintain adequate ventilation will improve Outcome: Not Progressing Goal: Ability to maintain a clear airway will improve Outcome: Not Progressing

## 2017-10-09 NOTE — Progress Notes (Signed)
PULMONARY / CRITICAL CARE MEDICINE   Name: Tonya Mcgee MRN: [id number] DOB: [birth year]    ADMISSION DATE:  10/04/2017  Interval Hx: Remains on NRFM feeling overall better but weak    CHIEF COMPLAINT:  Respiratory distress/H. influ CAP with bacteremia  HISTORY OF PRESENT ILLNESS:   This is a46 year old female with minimal past medical history other than hypertensionwho presented 4/24 with respiratory distress. According to her husband she had 1 day history of shortness of breath, congestion, orthopnea. She got out of bedthat eveningto go sleep in the recliner due to orthopnea and when husband awokeon the AM of admissionshe was minimally responsive. EMS placed her on BiPAP. In ER she had a labile blood pressure with SBP 60s at times. Normal WBC, afebrile, BNP 417, normal lactate.She has been maintained on BiPaP as she declines intubation unless she arrests. She denies prior OHD. Blood cultures are positive for H. Influ., for which she is on IV Zithromax and Azactam (beta-lactam allergy).  PAST MEDICAL HISTORY :  She  has a past medical history of Anemia, History of chicken pox, History of measles, History of mumps, Hypertension, and Rheumatoid arthritis (HCC).  PAST SURGICAL HISTORY: She  has a past surgical history that includes Carpal tunnel release (Right, 1990); Knee surgery (Left, 1989); Appendectomy (1960); Tonsillectomy (1946); Carotid doppler ultrasound (11/18/2013); pap smear (2012); mammogram screening (2012); Bone Density Study (2011); and Colonoscopy Screening (2011).  Allergies  Allergen Reactions  . Iodine Hives    Tolerated amiodarone 09/2017 ANTISEPTICS & DISINFECTANTS Throat swelling  . Sulfa Antibiotics     Vomiting and diarrhea  . Amoxicillin Hives  . Ciprofloxacin Hives  . Codeine Other (See Comments) and Hives    Weird dreams  . Keflex  [Cephalexin]   . Penicillins Hives  . Pravastatin Nausea Only  . Tape     skin red irritated    No current  facility-administered medications on file prior to encounter.    Current Outpatient Medications on File Prior to Encounter  Medication Sig  . albuterol (PROVENTIL HFA;VENTOLIN HFA) 108 (90 Base) MCG/ACT inhaler Inhale 2 puffs into the lungs every 6 (six) hours as needed for wheezing or shortness of breath.  Marland Kitchen aspirin 81 MG tablet Take 1 tablet by mouth daily.  Marland Kitchen atorvastatin (LIPITOR) 10 MG tablet TAKE 1 TABLET DAILY  . Ferrous Sulfate (IRON) 325 (65 Fe) MG TABS Take 1 tablet by mouth daily.  . furosemide (LASIX) 20 MG tablet Take 1 tablet (20 mg total) by mouth daily.  . hydroxychloroquine (PLAQUENIL) 200 MG tablet Take 2 tablets by mouth daily.  Marland Kitchen levothyroxine (SYNTHROID, LEVOTHROID) 100 MCG tablet TAKE ONE TABLET DAILY  . lisinopril-hydrochlorothiazide (PRINZIDE,ZESTORETIC) 10-12.5 MG tablet TAKE 1 TABLET DAILY  . Multiple Vitamins-Minerals (MULTIVITAMIN ADULT PO) Take 1 tablet by mouth daily.  . Omega 3 1200 MG CAPS Take 1 capsule by mouth daily.  . cyclobenzaprine (FLEXERIL) 5 MG tablet Take 1 tablet (5 mg total) by mouth at bedtime.  . naproxen (NAPROSYN) 500 MG tablet Take 1 tablet (500 mg total) by mouth 2 (two) times daily as needed.  . traMADol (ULTRAM) 50 MG tablet Take 1 tablet (50 mg total) by mouth every 8 (eight) hours as needed.    FAMILY HISTORY:  Her indicated that her mother is deceased. She indicated that her father is deceased. She indicated that her sister is alive. She indicated that her brother is alive.   SOCIAL HISTORY: She  reports that she quit smoking about 39 years ago. Her  smoking use included cigarettes. She has a 7.50 pack-year smoking history. She has never used smokeless tobacco. She reports that she drinks alcohol. She reports that she does not use drugs.  SUBJECTIVE:  Slightly more SOB at moment following placement of PICC line  VITAL SIGNS: BP 136/68   Pulse 72   Temp (!) 97.5 F (36.4 C) (Oral)   Resp (!) 23   Ht 5\' 2"  (1.575 m)   Wt 95 kg  (209 lb 7 oz)   SpO2 90%   BMI 38.31 kg/m   INTAKE / OUTPUT: I/O last 3 completed shifts: In: 6012.5 [P.O.:720; I.V.:4062.5; Blood:630; IV Piggyback:600] Out: 3050 [Urine:3050]  PHYSICAL EXAMINATION: General:  WD/WN WF mild resp distress Neuro:  A&O. No focal deficits HEENT:  [place]/AT PERRL EOMI OP-neg Cardiovascular:  RRR, no m/r/g Lungs: coarse crackles on the right clear on left Abdomen:  Supple, NT, no masses +BS. Rectal w/o masses. Stool sent for hemoccult Musculoskeletal:  No active joints Skin:  No C/C/E  LABS:  BMET Recent Labs  Lab 10/06/17 0709 10/08/17 0425 10/09/17 0445  NA 138 138 140  K 4.0 4.7 4.8  CL 104 106 108  CO2 21* 24 24  BUN 77* 90* 82*  CREATININE 1.85* 1.44* 1.26*  GLUCOSE 145* 165* 153*    Electrolytes Recent Labs  Lab 10/05/17 1100 10/06/17 0709 10/08/17 0425 10/09/17 0445  CALCIUM 8.1* 7.3* 7.0* 7.2*  MG 2.3 2.4  --   --   PHOS  --  5.3*  --   --     CBC Recent Labs  Lab 10/07/17 1225 10/08/17 0425 10/08/17 2004 10/09/17 0445  WBC 26.7* 23.8*  --  25.9*  HGB 7.3* 6.7* 9.1* 9.0*  HCT 22.3* 20.1* 27.8* 27.3*  PLT 240 253  --  228    Coag's Recent Labs  Lab 10/04/17 1408 10/06/17 0709  APTT 22* 52*  INR 1.14 1.26    Sepsis Markers Recent Labs  Lab 10/04/17 1008 10/04/17 1408 10/04/17 1704 10/05/17 0710 10/06/17 0655 10/06/17 0709  LATICACIDVEN 1.71  --  1.3  --  1.7  --   PROCALCITON  --  15.94  --  23.83  --  16.63    ABG Recent Labs  Lab 10/05/17 0346 10/05/17 1226 10/06/17 0325  PHART 7.322* 7.339* 7.382  PCO2ART 31.6* 29.1* 31.6*  PO2ART 105.0 86.0 134.0*    Liver Enzymes Recent Labs  Lab 10/06/17 0709  AST 45*  ALT 31  ALKPHOS 94  BILITOT 0.6  ALBUMIN 1.8*    Cardiac Enzymes Recent Labs  Lab 10/04/17 1408  TROPONINI 0.05*    Glucose Recent Labs  Lab 10/06/17 1150 10/06/17 1643 10/06/17 1956 10/06/17 2351 10/07/17 0353 10/07/17 0733  GLUCAP 148* 146* 143* 130* 140* 156*     Imaging No results found.   STUDIES:  No recent  CULTURES: [facility] 4/26 NG to date  ANTIBIOTICS: Zitrhomax - last dose today Aztreoman >5/7  SIGNIFICANT EVENTS: No recent  LINES/TUBES: PICC LUE  DISCUSSION: 77 year old female withno prior hx OHDpresentedto the ED with respiratory failure, hypoxemia, BiPAP and septic shock from a RLL PNA. Blood cultures pos for H influ. 4/26 BC neg to date  ASSESSMENT / PLAN:  PULMONARY A: Acute hypoxemic respiratory failure  H influ CAP with bacteremia. Tolerating NRM OK P:   Switch to highflow   Continue ABx  CARDIOVASCULAR A:  AF with RVR. Maintained on Amiodarone P:  Continue as same  RENAL A:   Increasing  BUN with dec Creat; lytes OK. Likely related to CS and +/- possible GIB P:   Trend BMP. Decrease SoluMedrol dose  GASTROINTESTINAL A:   Acute blood loss anemia UGI bleed  P:   Positive occult blood  GI follow up Heparin on hold   HEMATOLOGIC A:   Acute blood loss anemia  P:  Transfused with blood   INFECTIOUS A:   H influ CAP P:   Aztreoman until 5/7  ENDOCRINE A:   Status   P:   Synthroid     Pulmonary and Critical Care Medicine [facility] Pager: [phone]  10/09/2017, 10:17 AM

## 2017-10-10 ENCOUNTER — Inpatient Hospital Stay (HOSPITAL_COMMUNITY): Payer: Medicare Other

## 2017-10-10 DIAGNOSIS — D649 Anemia, unspecified: Secondary | ICD-10-CM

## 2017-10-10 DIAGNOSIS — M059 Rheumatoid arthritis with rheumatoid factor, unspecified: Secondary | ICD-10-CM

## 2017-10-10 DIAGNOSIS — D62 Acute posthemorrhagic anemia: Secondary | ICD-10-CM

## 2017-10-10 DIAGNOSIS — K921 Melena: Secondary | ICD-10-CM

## 2017-10-10 DIAGNOSIS — K922 Gastrointestinal hemorrhage, unspecified: Secondary | ICD-10-CM

## 2017-10-10 LAB — CBC WITH DIFFERENTIAL/PLATELET
BASOS PCT: 0 %
Basophils Absolute: 0 10*3/uL (ref 0.0–0.1)
EOS ABS: 0 10*3/uL (ref 0.0–0.7)
Eosinophils Relative: 0 %
HEMATOCRIT: 26.1 % — AB (ref 36.0–46.0)
HEMOGLOBIN: 8.7 g/dL — AB (ref 12.0–15.0)
LYMPHS PCT: 5 %
Lymphs Abs: 0.9 10*3/uL (ref 0.7–4.0)
MCH: 31.4 pg (ref 26.0–34.0)
MCHC: 33.3 g/dL (ref 30.0–36.0)
MCV: 94.2 fL (ref 78.0–100.0)
Monocytes Absolute: 0.9 10*3/uL (ref 0.1–1.0)
Monocytes Relative: 5 %
NEUTROS ABS: 17.1 10*3/uL — AB (ref 1.7–7.7)
Neutrophils Relative %: 90 %
Platelets: 235 10*3/uL (ref 150–400)
RBC: 2.77 MIL/uL — ABNORMAL LOW (ref 3.87–5.11)
RDW: 15.9 % — ABNORMAL HIGH (ref 11.5–15.5)
WBC: 18.9 10*3/uL — ABNORMAL HIGH (ref 4.0–10.5)

## 2017-10-10 LAB — BASIC METABOLIC PANEL
ANION GAP: 10 (ref 5–15)
BUN: 61 mg/dL — ABNORMAL HIGH (ref 6–20)
CALCIUM: 7.5 mg/dL — AB (ref 8.9–10.3)
CO2: 23 mmol/L (ref 22–32)
Chloride: 107 mmol/L (ref 101–111)
Creatinine, Ser: 1.06 mg/dL — ABNORMAL HIGH (ref 0.44–1.00)
GFR calc Af Amer: 57 mL/min — ABNORMAL LOW (ref >60)
GFR calc non Af Amer: 49 mL/min — ABNORMAL LOW (ref >60)
GLUCOSE: 139 mg/dL — AB (ref 65–99)
Potassium: 4.9 mmol/L (ref 3.5–5.1)
Sodium: 140 mmol/L (ref 135–145)

## 2017-10-10 LAB — CULTURE, BLOOD (ROUTINE X 2): CULTURE: NO GROWTH

## 2017-10-10 MED ORDER — PANTOPRAZOLE SODIUM 40 MG PO TBEC
40.0000 mg | DELAYED_RELEASE_TABLET | Freq: Two times a day (BID) | ORAL | Status: DC
  Administered 2017-10-10 – 2017-10-16 (×12): 40 mg via ORAL
  Filled 2017-10-10 (×12): qty 1

## 2017-10-10 MED ORDER — TRAMADOL HCL 50 MG PO TABS
25.0000 mg | ORAL_TABLET | Freq: Four times a day (QID) | ORAL | Status: DC | PRN
  Administered 2017-10-10 – 2017-10-14 (×11): 25 mg via ORAL
  Filled 2017-10-10 (×11): qty 1

## 2017-10-10 MED ORDER — FUROSEMIDE 10 MG/ML IJ SOLN
40.0000 mg | Freq: Once | INTRAMUSCULAR | Status: AC
  Administered 2017-10-10: 40 mg via INTRAVENOUS
  Filled 2017-10-10: qty 4

## 2017-10-10 MED ORDER — FENTANYL CITRATE (PF) 100 MCG/2ML IJ SOLN: 25.0000 ug | INTRAMUSCULAR | Status: DC | PRN

## 2017-10-10 NOTE — Progress Notes (Addendum)
Daily Rounding Note  10/10/2017, 8:38 AM  LOS: 6 days   ASSESMENT:   *  GIB, black stool (though on po iron chronically).   Empiric Protonix drip in place.    *  Acute on chronic anemia.  Hgb stable after 2 U PRBCs on 4/28.    *   CAP, hypoxia.   On Solumedrol, aztreonam.  WBCs better.    *  New Afib/RVR.  Heparin gtt held due to anemia.  Still remains in NSR  *  AKI.  Improved.     PLAN   *  Switch to po BID Protonix.  Allow solid food.  CBC in AM.    *  Avoid EGD until tenuous resp status improves.      Jennye Moccasin  10/10/2017, 8:38 AM Phone [phone]    [facility] Attending   I have taken an interval history, reviewed the chart and examined the patient. I agree with the Advanced Practitioner's note, impression and recommendations.   She is stable to improved. Not sure she will need an EGD depending upon clinical course - most likely NSAID's caused bleeding (+ heparin) - if we need to determine bleeding risk perhaps but her lungs will need to get a lot better  Following  Iva Boop, MD, [facility] 10/10/2017 4:57 PM       SUBJECTIVE:   Chief complaint: pain in right scapula overnight.    No stools yesterday or today.  No n/v.  Appetite  Depressed, tolerating full liquids.   Off NRB oxygen mask.    OBJECTIVE:         Vital signs in last 24 hours:    Temp:  [97.6 F (36.4 C)-98.6 F (37 C)] 97.6 F (36.4 C) (04/30 0805) Pulse Rate:  [68-77] 75 (04/30 0719) Resp:  [14-29] 22 (04/30 0719) BP: (105-171)/(51-131) 145/65 (04/30 0719) SpO2:  [88 %-100 %] 91 % (04/30 0723) FiO2 (%):  [70 %-80 %] 70 % (04/30 0723) Last BM Date: 10/09/17 Filed Weights   10/05/17 0900  Weight: 209 lb 7 oz (95 kg)   General: looks better but still looks unwell.  Alert, comfortable   Heart: RRR Chest: crackles in bases.  Wheezes throughout.  Less dyspnea Abdomen: soft, NT, obese.      Extremities: no CCE Neuro/Psych:  Oriented x 3.  More alert and engaged.    Intake/Output from previous day: 04/29 0701 - 04/30 0700 In: 2635.5 [P.O.:270; I.V.:2065.5; IV Piggyback:300] Out: 1800 [Urine:1800]   Lab Results: Recent Labs    10/08/17 0425 10/08/17 2004 10/09/17 0445 10/10/17 0442  WBC 23.8*  --  25.9* 18.9*  HGB 6.7* 9.1* 9.0* 8.7*  HCT 20.1* 27.8* 27.3* 26.1*  PLT 253  --  228 235   BMET Recent Labs    10/08/17 0425 10/09/17 0445 10/10/17 0442  NA 138 140 140  K 4.7 4.8 4.9  CL 106 108 107  CO2 24 24 23   GLUCOSE 165* 153* 139*  BUN 90* 82* 61*  CREATININE 1.44* 1.26* 1.06*  CALCIUM 7.0* 7.2* 7.5*    Scheduled Meds: . amiodarone  400 mg Oral BID  . budesonide (PULMICORT) nebulizer solution  0.5 mg Nebulization BID  . chlorhexidine  15 mL Mouth Rinse BID  . Chlorhexidine Gluconate Cloth  6 each Topical Daily  . feeding supplement  1 Container Oral TID BM  . guaiFENesin  600 mg Oral BID  . hydroxychloroquine  400 mg Oral Daily  . levothyroxine  100 mcg Oral QAC breakfast  . mouth rinse  15 mL Mouth Rinse q12n4p  . methylPREDNISolone (SOLU-MEDROL) injection  60 mg Intravenous Q24H  . [START ON 10/12/2017] pantoprazole  40 mg Intravenous Q12H  . sodium chloride flush  10-40 mL Intracatheter Q12H   Continuous Infusions: . aztreonam Stopped (10/10/17 0631)  . dextrose 5 % and 0.45 % NaCl with KCl 20 mEq/L 50 mL/hr at 10/09/17 2000  . pantoprozole (PROTONIX) infusion 8 mg/hr (10/10/17 0345)   PRN Meds:.acetaminophen, albuterol, diphenhydrAMINE, fentaNYL (SUBLIMAZE) injection, ondansetron, polyethylene glycol, senna-docusate, sodium chloride flush, sorbitol

## 2017-10-10 NOTE — Progress Notes (Signed)
Progress Note  Patient Name: Tonya Mcgee Date of Encounter: 10/10/2017  Primary Cardiologist: No primary care provider on file.   Subjective   No complaints. No chest pain. She reports that her breathing is getting better.   Inpatient Medications    Scheduled Meds: . amiodarone  400 mg Oral BID  . budesonide (PULMICORT) nebulizer solution  0.5 mg Nebulization BID  . chlorhexidine  15 mL Mouth Rinse BID  . Chlorhexidine Gluconate Cloth  6 each Topical Daily  . feeding supplement  1 Container Oral TID BM  . guaiFENesin  600 mg Oral BID  . hydroxychloroquine  400 mg Oral Daily  . levothyroxine  100 mcg Oral QAC breakfast  . mouth rinse  15 mL Mouth Rinse q12n4p  . methylPREDNISolone (SOLU-MEDROL) injection  60 mg Intravenous Q24H  . [START ON 10/12/2017] pantoprazole  40 mg Intravenous Q12H  . sodium chloride flush  10-40 mL Intracatheter Q12H   Continuous Infusions: . aztreonam Stopped (10/10/17 0631)  . dextrose 5 % and 0.45 % NaCl with KCl 20 mEq/L 50 mL/hr at 10/09/17 2000  . pantoprozole (PROTONIX) infusion 8 mg/hr (10/10/17 0345)   PRN Meds: acetaminophen, albuterol, diphenhydrAMINE, fentaNYL (SUBLIMAZE) injection, ondansetron, polyethylene glycol, senna-docusate, sodium chloride flush, sorbitol   Vital Signs    Vitals:   10/10/17 0700 10/10/17 0719 10/10/17 0723 10/10/17 0805  BP:  (!) 145/65    Pulse: 75 75    Resp: (!) 29 (!) 22    Temp:    97.6 F (36.4 C)  TempSrc:    Oral  SpO2: (!) 89% (!) 89% 91%   Weight:      Height:        Intake/Output Summary (Last 24 hours) at 10/10/2017 4627 Last data filed at 10/10/2017 0700 Gross per 24 hour  Intake 2543.83 ml  Output 1350 ml  Net 1193.83 ml   Filed Weights   10/05/17 0900  Weight: 209 lb 7 oz (95 kg)    Telemetry    sinus- Personally Reviewed   Physical Exam   General: Well developed, well nourished, NAD  HEENT: OP clear, mucus membranes moist  SKIN: warm, dry. No rashes. Neuro: No focal  deficits  Musculoskeletal: Muscle strength 5/5 all ext  Psychiatric: Mood and affect normal  Neck: No JVD, no carotid bruits, no thyromegaly, no lymphadenopathy.  Lungs:Diffuse rhonci, wheezing.  Cardiovascular: Regular rate and rhythm. No murmurs, gallops or rubs. Abdomen:Soft. Bowel sounds present. Non-tender.  Extremities: No lower extremity edema. Pulses are 2 + in the bilateral DP/PT.   Labs    Chemistry Recent Labs  Lab 10/06/17 0709 10/08/17 0425 10/09/17 0445 10/10/17 0442  NA 138 138 140 140  K 4.0 4.7 4.8 4.9  CL 104 106 108 107  CO2 21* 24 24 23   GLUCOSE 145* 165* 153* 139*  BUN 77* 90* 82* 61*  CREATININE 1.85* 1.44* 1.26* 1.06*  CALCIUM 7.3* 7.0* 7.2* 7.5*  PROT 5.5*  --   --   --   ALBUMIN 1.8*  --   --   --   AST 45*  --   --   --   ALT 31  --   --   --   ALKPHOS 94  --   --   --   BILITOT 0.6  --   --   --   GFRNONAA 25* 34* 40* 49*  GFRAA 29* 39* 46* 57*  ANIONGAP 13 8 8 10      Hematology Recent Labs  Lab 10/08/17 0425 10/08/17 2004 10/09/17 0445 10/10/17 0442  WBC 23.8*  --  25.9* 18.9*  RBC 2.09*  --  2.91* 2.77*  HGB 6.7* 9.1* 9.0* 8.7*  HCT 20.1* 27.8* 27.3* 26.1*  MCV 96.2  --  93.8 94.2  MCH 32.1  --  30.9 31.4  MCHC 33.3  --  33.0 33.3  RDW 14.6  --  15.8* 15.9*  PLT 253  --  228 235    Cardiac Enzymes Recent Labs  Lab 10/04/17 1408  TROPONINI 0.05*    Recent Labs  Lab 10/04/17 1006  TROPIPOC 0.04     BNP Recent Labs  Lab 10/04/17 0954 10/06/17 0655  BNP 417.6* 986.7*     DDimer No results for input(s): DDIMER in the last 168 hours.   Radiology    No results found.  Cardiac Studies   Echo 10-04-2017: Study Conclusions  - Left ventricle: The cavity size was normal. Systolic function was   vigorous. The estimated ejection fraction was in the range of 65%   to 70%. Wall motion was normal; there were no regional wall   motion abnormalities. Doppler parameters are consistent with   abnormal left ventricular  relaxation (grade 1 diastolic   dysfunction). Doppler parameters are consistent with high   ventricular filling pressure. - Aortic valve: Valve area (VTI): 2.57 cm^2. Valve area (Vmax):   2.64 cm^2. Valve area (Vmean): 2.35 cm^2. - Mitral valve: Calcified annulus. - Left atrium: The atrium was mildly dilated.  Impressions:  - Vigorous LV systolic function; mild diastolic dysfunction with   elevated LV filling pressure; mildly elevated LVOT velocity of   2.3 m/s likely related to vigorous LV function; mild LAE; trace   MR and TR.  Patient Profile     77 y.o. female with acute respiratory failure who developed AF with RVR, now converted to sinus rhythm on IV amiodarone  Assessment & Plan    1. Atrial fib with RVR: She is maintaining sinus. She is now on po amiodarone. Plan to continue oral amiodarone for now. She is not being anti-coagulated due to anemia and heme positive stool. No new recs today. We will follow with you.   For questions or updates, please contact [facility] Please consult [email or web address] for contact info under Cardiology/STEMI.      Signed, Verne Carrow, MD  10/10/2017, 8:22 AM

## 2017-10-10 NOTE — Progress Notes (Signed)
PULMONARY / CRITICAL CARE MEDICINE   Name: Tonya Mcgee MRN: [id number] DOB: 10-08-1940    ADMISSION DATE:  10/04/2017  Interval Hx: Remains on high flow oxygen 15L ( 70%). States she still feels weak, but a little better each day. Amio po with good rate control. Plan to change to protonix po 4/30 Heparin remains off  CHIEF COMPLAINT:  Respiratory distress/H. influ CAP with bacteremia  HISTORY OF PRESENT ILLNESS:   This is a77 year old female former smoker with minimal past medical history other than hypertensionwho presented 4/24 with respiratory distress. According to her husband she had 1 day history of shortness of breath, congestion, orthopnea. She got out of bedthat eveningto go sleep in the recliner due to orthopnea and when husband awokeon the AM of admissionshe was minimally responsive. EMS placed her on BiPAP. In ER she had a labile blood pressure with SBP 60s at times. Normal WBC, afebrile, BNP 417, normal lactate.She has been maintained on BiPaP as she declines intubation unless she arrests. She denies prior OHD. Blood cultures are positive for H. Influ., for which she is on IV Zithromax and Azactam (beta-lactam allergy).  SUBJECTIVE:  Remains on high flow oxygen. States she feels weak but better than yesterday. Some right scapular pain last pm  VITAL SIGNS: BP (!) 145/65 (BP Location: Left Arm)   Pulse 75   Temp 97.6 F (36.4 C) (Oral)   Resp (!) 22   Ht 5\' 2"  (1.575 m)   Wt 209 lb 7 oz (95 kg)   SpO2 91%   BMI 38.31 kg/m   INTAKE / OUTPUT: I/O last 3 completed shifts: In: 4245.9 [P.O.:510; I.V.:3135.9; IV Piggyback:600] Out: 2600 [Urine:2600]  PHYSICAL EXAMINATION: General:  Frail female, supine in bed on high flow oxygen, awake and alert Neuro:  MAE x 4, A&O x 3, appropriate, awake and alert, follows commands HEENT:NCAT, PERRLA, no JVD noted Cardiovascular: S1, S2, RRR, No RMG Lungs: Coarse crackles with expiratory wheeze noted Abdomen:  Soft,  NT, ND, BS +, Obese Musculoskeletal: No obvious deformities, poor muscle bulk Skin:  Warm, dry and intact, multiple bruises  LABS:  BMET Recent Labs  Lab 10/08/17 0425 10/09/17 0445 10/10/17 0442  NA 138 140 140  K 4.7 4.8 4.9  CL 106 108 107  CO2 24 24 23   BUN 90* 82* 61*  CREATININE 1.44* 1.26* 1.06*  GLUCOSE 165* 153* 139*    Electrolytes Recent Labs  Lab 10/05/17 1100 10/06/17 0709 10/08/17 0425 10/09/17 0445 10/10/17 0442  CALCIUM 8.1* 7.3* 7.0* 7.2* 7.5*  MG 2.3 2.4  --   --   --   PHOS  --  5.3*  --   --   --     CBC Recent Labs  Lab 10/08/17 0425 10/08/17 2004 10/09/17 0445 10/10/17 0442  WBC 23.8*  --  25.9* 18.9*  HGB 6.7* 9.1* 9.0* 8.7*  HCT 20.1* 27.8* 27.3* 26.1*  PLT 253  --  228 235    Coag's Recent Labs  Lab 10/04/17 1408 10/06/17 0709  APTT 22* 52*  INR 1.14 1.26    Sepsis Markers Recent Labs  Lab 10/04/17 1008 10/04/17 1408 10/04/17 1704 10/05/17 0710 10/06/17 0655 10/06/17 0709  LATICACIDVEN 1.71  --  1.3  --  1.7  --   PROCALCITON  --  15.94  --  23.83  --  16.63    ABG Recent Labs  Lab 10/05/17 0346 10/05/17 1226 10/06/17 0325  PHART 7.322* 7.339* 7.382  PCO2ART 31.6* 29.1* 31.6*  PO2ART 105.0 86.0 134.0*    Liver Enzymes Recent Labs  Lab 10/06/17 0709  AST 45*  ALT 31  ALKPHOS 94  BILITOT 0.6  ALBUMIN 1.8*    Cardiac Enzymes Recent Labs  Lab 10/04/17 1408  TROPONINI 0.05*    Glucose Recent Labs  Lab 10/06/17 1150 10/06/17 1643 10/06/17 1956 10/06/17 2351 10/07/17 0353 10/07/17 0733  GLUCAP 148* 146* 143* 130* 140* 156*    Imaging No results found.   STUDIES:  Echo 10/04/2017 EF 65-7-%, Grade 1 Diastolic dysfunction,  trace MR and TR.  CULTURES: BC 4/26 NG to date  ANTIBIOTICS: Zitrhomax - last dose 4/29 Aztreoman >5/7>>  SIGNIFICANT EVENTS: No recent  LINES/TUBES: PICC LUE  DISCUSSION: 77 year old female withno prior hx OHDpresentedto the ED with respiratory  failure, hypoxemia, BiPAP and septic shock from a RLL PNA. Blood cultures pos for H influ. 4/26 BC neg to date  ASSESSMENT / PLAN:  PULMONARY A: Acute hypoxemic respiratory failure  H influ CAP with bacteremia. Tolerating High Flow O2 at 15 L ( 70%) Desats into the 70's with head flat P:   Continue HFNC Wean for sats of > 94% Trend CXR daily Aggressive pulmonary Toilet when able ( IS, Cough and deep breath) Mobilize when able Bed in chair position   CARDIOVASCULAR A:  AF with RVR. Maintained on Amiodarone po Good rate control  CVP 21 + 10 L P:  Lasix 40 mg x 1 dose Per cardiology. Appreciate input Tele Maintain MAP > 65 mmHg Decrease IVF to 25/hr  RENAL A:   Decreasing  BUN with dec Creat; lytes OK. Likely related to CS and +/- possible GIB P:   Trend BMP and urine output Replete electro lytes as needed. Avoid nephro toxic medications  Check Mag 5/1   GASTROINTESTINAL A:   Acute blood loss anemia UGI bleed  Eating and taking po's P:   Positive occult blood  GI  Following, appreciate assist Heparin on hold since 4/28  HEMATOLOGIC A:   Acute blood loss anemia   P:  Transfused with blood  CBC daily Monitor for obvious bleeding Transfuse for HGB < 7  INFECTIOUS A:   H influ CAP Afebrile WBC down trending P:   Aztreoman until 5/7 Trend CBC and Fever curve  ABX as above Culture as clinically indicated  ENDOCRINE A:   Status   P:  Follow glucose per BMET Add CBG's if indicated  Synthroid Decrease SoluMedrol dose  Still tenuous respiratory status. Will need PT/OT once she is more stable and able to tolerate activity.  Bevelyn Ngo, AGACNP-BC Pulmonary and Critical Care Medicine [facility] Pager: [phone]  10/10/2017, 8:55 AM

## 2017-10-10 NOTE — Progress Notes (Signed)
I personally reviewed 4/30 chest xray with Dr. Deanne Coffer and per his recommendations, PICC needs to be retracted 4 - 6 cm.

## 2017-10-10 NOTE — Progress Notes (Signed)
Pharmacy Antibiotic Note  Tonya Mcgee is a 77 y.o. female admitted on 10/04/2017 with sepsis, PNA.  Pharmacy had been consulted for meropenem/vancomycin dosing.  BCID grew H.Flu and ABX changed to aztreonam and azithromycin.  Azithromycin course complete 4/28.  Currently Afebrile, WBC elevated 18.9 but also on methylprednisone, LA 1.7, PCT drop 23> 16 Cr improved 2.2>1.06   Plan: Aztreonam 1gm iv q8h - 4/24> (5/7)  Height: 5\' 2"  (157.5 cm) Weight: 209 lb 7 oz (95 kg) IBW/kg (Calculated) : 50.1  Temp (24hrs), Avg:97.8 F (36.6 C), Min:97.6 F (36.4 C), Max:98.6 F (37 C)  Recent Labs  Lab 10/04/17 1008 10/04/17 1704  10/06/17 0328 10/06/17 0655 10/06/17 0709 10/07/17 0202 10/07/17 1225 10/08/17 0425 10/09/17 0445 10/10/17 0442  WBC  --   --    < >  --   --  22.6* 28.1* 26.7* 23.8* 25.9* 18.9*  CREATININE  --   --    < > 1.70*  --  1.85*  --   --  1.44* 1.26* 1.06*  LATICACIDVEN 1.71 1.3  --   --  1.7  --   --   --   --   --   --    < > = values in this interval not displayed.    Estimated Creatinine Clearance: 47 mL/min (A) (by C-G formula based on SCr of 1.06 mg/dL (H)).    Allergies  Allergen Reactions  . Iodine Hives    Tolerated amiodarone 09/2017 ANTISEPTICS & DISINFECTANTS Throat swelling  . Sulfa Antibiotics     Vomiting and diarrhea  . Amoxicillin Hives  . Ciprofloxacin Hives  . Codeine Other (See Comments) and Hives    Weird dreams  . Keflex  [Cephalexin]   . Penicillins Hives  . Pravastatin Nausea Only  . Tape     skin red irritated   Antimicrobials this admission:  Azithro 4/24 >> 4/28 Meropenem 4/24 >> 4/25 Vancomycin 4/24 >> 4/25 Aztreonam 4/25 >>   Dose adjustments this admission:  n/a  Microbiology results:  4/25 MRSA PCR: negative  4/24 UCx: NG final 4/24 BCx: H.flu (4/4) 4/25 BCx: NG<12h 4/26 BCx: NGTD   5/26, BCPS  Clinical Pharmacist Pager [phone]  10/10/2017 8:57 AM

## 2017-10-10 NOTE — Progress Notes (Signed)
Call received from radiologist, they recommend PICC line be pulled back 5.8 cm. IV team called and made aware- awaiting further instructions.

## 2017-10-11 ENCOUNTER — Inpatient Hospital Stay (HOSPITAL_COMMUNITY): Payer: Medicare Other

## 2017-10-11 DIAGNOSIS — I5033 Acute on chronic diastolic (congestive) heart failure: Secondary | ICD-10-CM

## 2017-10-11 DIAGNOSIS — I5031 Acute diastolic (congestive) heart failure: Secondary | ICD-10-CM

## 2017-10-11 DIAGNOSIS — R0602 Shortness of breath: Secondary | ICD-10-CM

## 2017-10-11 LAB — CBC
HCT: 27.6 % — ABNORMAL LOW (ref 36.0–46.0)
HEMOGLOBIN: 8.8 g/dL — AB (ref 12.0–15.0)
MCH: 30.3 pg (ref 26.0–34.0)
MCHC: 31.9 g/dL (ref 30.0–36.0)
MCV: 95.2 fL (ref 78.0–100.0)
PLATELETS: 289 10*3/uL (ref 150–400)
RBC: 2.9 MIL/uL — AB (ref 3.87–5.11)
RDW: 15.6 % — ABNORMAL HIGH (ref 11.5–15.5)
WBC: 22.4 10*3/uL — AB (ref 4.0–10.5)

## 2017-10-11 LAB — BASIC METABOLIC PANEL
ANION GAP: 9 (ref 5–15)
BUN: 55 mg/dL — ABNORMAL HIGH (ref 6–20)
CALCIUM: 7.9 mg/dL — AB (ref 8.9–10.3)
CHLORIDE: 110 mmol/L (ref 101–111)
CO2: 24 mmol/L (ref 22–32)
Creatinine, Ser: 1.06 mg/dL — ABNORMAL HIGH (ref 0.44–1.00)
GFR calc non Af Amer: 49 mL/min — ABNORMAL LOW (ref >60)
GFR, EST AFRICAN AMERICAN: 57 mL/min — AB (ref >60)
Glucose, Bld: 131 mg/dL — ABNORMAL HIGH (ref 65–99)
Potassium: 5.1 mmol/L (ref 3.5–5.1)
SODIUM: 143 mmol/L (ref 135–145)

## 2017-10-11 LAB — CULTURE, BLOOD (ROUTINE X 2)
Culture: NO GROWTH
Special Requests: ADEQUATE

## 2017-10-11 LAB — MAGNESIUM: MAGNESIUM: 1.8 mg/dL (ref 1.7–2.4)

## 2017-10-11 MED ORDER — MAGIC MOUTHWASH W/LIDOCAINE
15.0000 mL | Freq: Three times a day (TID) | ORAL | Status: DC | PRN
  Administered 2017-10-11 – 2017-10-12 (×7): 5 mL via ORAL
  Administered 2017-10-12: 15 mL via ORAL
  Administered 2017-10-12: 5 mL via ORAL
  Administered 2017-10-13 – 2017-10-14 (×5): 15 mL via ORAL
  Filled 2017-10-11 (×16): qty 15

## 2017-10-11 MED ORDER — DEXTROSE-NACL 5-0.45 % IV SOLN
INTRAVENOUS | Status: DC
  Administered 2017-10-11: 11:00:00 via INTRAVENOUS

## 2017-10-11 MED ORDER — FUROSEMIDE 10 MG/ML IJ SOLN
40.0000 mg | Freq: Two times a day (BID) | INTRAMUSCULAR | Status: DC
  Administered 2017-10-11 – 2017-10-16 (×11): 40 mg via INTRAVENOUS
  Filled 2017-10-11 (×11): qty 4

## 2017-10-11 NOTE — Progress Notes (Addendum)
Progress Note  Patient Name: Tonya Mcgee Date of Encounter: 10/11/2017  Primary Cardiologist: No primary care provider on file.   Subjective   Very sleepy this am.  Had a rough night with SOB and agitation and hypoxia and was given IV lasix  Inpatient Medications    Scheduled Meds: . amiodarone  400 mg Oral BID  . budesonide (PULMICORT) nebulizer solution  0.5 mg Nebulization BID  . chlorhexidine  15 mL Mouth Rinse BID  . Chlorhexidine Gluconate Cloth  6 each Topical Daily  . feeding supplement  1 Container Oral TID BM  . guaiFENesin  600 mg Oral BID  . hydroxychloroquine  400 mg Oral Daily  . levothyroxine  100 mcg Oral QAC breakfast  . mouth rinse  15 mL Mouth Rinse q12n4p  . methylPREDNISolone (SOLU-MEDROL) injection  60 mg Intravenous Q24H  . pantoprazole  40 mg Oral BID  . sodium chloride flush  10-40 mL Intracatheter Q12H   Continuous Infusions: . aztreonam Stopped (10/11/17 0531)  . dextrose 5 % and 0.45 % NaCl with KCl 20 mEq/L 25 mL/hr at 10/10/17 2000   PRN Meds: acetaminophen, albuterol, diphenhydrAMINE, fentaNYL (SUBLIMAZE) injection, ondansetron, polyethylene glycol, senna-docusate, sodium chloride flush, sorbitol, traMADol   Vital Signs    Vitals:   10/11/17 0500 10/11/17 0600 10/11/17 0700 10/11/17 0754  BP: (!) 126/93     Pulse: 77 74 73   Resp: 20 (!) 22 (!) 25   Temp:    97.6 F (36.4 C)  TempSrc:    Oral  SpO2: 97% 96% 94%   Weight:      Height:        Intake/Output Summary (Last 24 hours) at 10/11/2017 0829 Last data filed at 10/11/2017 0700 Gross per 24 hour  Intake 1380.42 ml  Output 1400 ml  Net -19.58 ml   Filed Weights   10/05/17 0900  Weight: 209 lb 7 oz (95 kg)    Telemetry    NSR - Personally Reviewed  ECG    No new EKG to review - Personally Reviewed  Physical Exam   GEN: No acute distress.   Neck: No JVD Cardiac: RRR, no murmurs, rubs, or gallops.  Respiratory: Clear to auscultation bilaterally. GI: Soft,  nontender, non-distended  MS: No edema; No deformity. Neuro:  Nonfocal  Psych: Normal affect   Labs    Chemistry Recent Labs  Lab 10/06/17 0709  10/09/17 0445 10/10/17 0442 10/11/17 0350  NA 138   < > 140 140 143  K 4.0   < > 4.8 4.9 5.1  CL 104   < > 108 107 110  CO2 21*   < > 24 23 24   GLUCOSE 145*   < > 153* 139* 131*  BUN 77*   < > 82* 61* 55*  CREATININE 1.85*   < > 1.26* 1.06* 1.06*  CALCIUM 7.3*   < > 7.2* 7.5* 7.9*  PROT 5.5*  --   --   --   --   ALBUMIN 1.8*  --   --   --   --   AST 45*  --   --   --   --   ALT 31  --   --   --   --   ALKPHOS 94  --   --   --   --   BILITOT 0.6  --   --   --   --   GFRNONAA 25*   < > 40* 49* 49*  GFRAA 29*   < > 46* 57* 57*  ANIONGAP 13   < > 8 10 9    < > = values in this interval not displayed.     Hematology Recent Labs  Lab 10/09/17 0445 10/10/17 0442 10/11/17 0350  WBC 25.9* 18.9* 22.4*  RBC 2.91* 2.77* 2.90*  HGB 9.0* 8.7* 8.8*  HCT 27.3* 26.1* 27.6*  MCV 93.8 94.2 95.2  MCH 30.9 31.4 30.3  MCHC 33.0 33.3 31.9  RDW 15.8* 15.9* 15.6*  PLT 228 235 289    Cardiac Enzymes Recent Labs  Lab 10/04/17 1408  TROPONINI 0.05*    Recent Labs  Lab 10/04/17 1006  TROPIPOC 0.04     BNP Recent Labs  Lab 10/04/17 0954 10/06/17 0655  BNP 417.6* 986.7*     DDimer No results for input(s): DDIMER in the last 168 hours.   Radiology    Dg Chest Port 1 View  Result Date: 10/10/2017 CLINICAL DATA:  76 year old female with shortness breath. Subsequent encounter. EXAM: PORTABLE CHEST 1 VIEW COMPARISON:  10/07/2017 chest x-ray. FINDINGS: Right central line has been placed. The tip projects at the level of the lower right atrium/atrium inferior vena caval junction. To be within the distal superior vena cava, this can be retracted by 5.8 cm. Asymmetric airspace disease with greatest consolidation right upper lobe similar to prior exam and may represent result of infection. Pulmonary edema secondary less likely  consideration. Cardiomegaly. Calcified tortuous aorta. No pneumothorax. IMPRESSION: Right central line has been placed. The tip projects at the level of the lower right atrium/atrium inferior vena caval junction. To be within the distal superior vena cava, this can be retracted by 5.8 cm. Asymmetric airspace disease with greatest consolidation right upper lobe similar to prior exam and may represent result of infection. Cardiomegaly. Aortic Atherosclerosis (ICD10-I70.0). These results will be called to the ordering clinician or representative by the Radiologist Assistant, and communication documented in the PACS or zVision Dashboard. Electronically Signed   By: Lacy Duverney M.D.   On: 10/10/2017 09:48    Cardiac Studies   Echo 10-04-2017: Study Conclusions  - Left ventricle: The cavity size was normal. Systolic function was vigorous. The estimated ejection fraction was in the range of 65% to 70%. Wall motion was normal; there were no regional wall motion abnormalities. Doppler parameters are consistent with abnormal left ventricular relaxation (grade 1 diastolic dysfunction). Doppler parameters are consistent with high ventricular filling pressure. - Aortic valve: Valve area (VTI): 2.57 cm^2. Valve area (Vmax): 2.64 cm^2. Valve area (Vmean): 2.35 cm^2. - Mitral valve: Calcified annulus. - Left atrium: The atrium was mildly dilated.  Impressions:  - Vigorous LV systolic function; mild diastolic dysfunction with elevated LV filling pressure; mildly elevated LVOT velocity of 2.3 m/s likely related to vigorous LV function; mild LAE; trace MR and TR.   Patient Profile     77 y.o. female  with acute respiratory failure who developed AF with RVR, now converted to sinus rhythm on IV amiodarone  Assessment & Plan     1.  Atrial fibrillation with RVR with -she converted to NSR on Amio -continue Amio 400mg  BID -check EKG today to follow QTc -no anticoagulation due to  GI bleed -continue to follow on tele  2.  RLL CAP with hypoxia and septic shock -cx positive for H influ -had a rough night with breathing and worsening hypoxia -Cxray pending this am. -CBC increased this am -antbx per CCM  3.  Acute diastolic CHF -has pitting  edema u her knees today -she is 10.4L positive -put out 1.6L yesterday with IV lasix 40mg  with CVP 21 and CVP 13 this am. -respiratory distress last night with hypoxia -start lasix 40mg  IV BID -follow I&O's, daily weights and renal function while diuresing (creatinine 1.06 today)  I have spent a total of 35 minutes with patient reviewing notes , telemetry, EKGs, labs and examining patient as well as establishing an assessment and plan that was discussed with the patient.  > 50% of time was spent in direct patient care.    For questions or updates, please contact [facility] Please consult [email or web address] for contact info under Cardiology/STEMI.      Signed, , MD  10/11/2017, 8:29 AM

## 2017-10-11 NOTE — Progress Notes (Signed)
Husband at bedside and updated on patient's overnight events.

## 2017-10-11 NOTE — Progress Notes (Signed)
Patient awoke from a deep sleep at midnight very anxious, restless, and tachypnic -- seemed like due to paroxysmal dyspnea.  Patient stated she "needs to sit up."  RN attempted to make her more comfortable -- moving the bed up and down to try and find a spot she could rest.  RT was called and a breathing treatment was given to patient.  RT attempted to place patient on NRB but patient refused.  Patient's O2 saturations rose to 90s.  During the episode, her O2 sats dropped to low 80s but improved.  RN attempted to console patient, but patient continued to be uncomfortable -- stating something like, "I can't believe I acted this way." At times she wanted to sit up higher in the bed and other times she wanted to be laid down more.  RN explained to sit her up more, she would need to be boosted up in the bed.  At first discussion she didn't really want to be boosted because she would have to be laid flat.  But after asking her again, she said we could try it.  Myself and two other RNs went to boost the patient.  We boosted her so high up in the bed that she hit her head on the headboard.  I asked her if her head was ok. She stated that she, "has a hard head and its fine." I then proceeded to reassess her and she said she had a headache.  She remained neuro intact to baseline with just a complaint of a headache, which she said was, "probably due to her craziness."  I had the charge RN to come in and assess her as well.  She remained neuro intact, able to move all extremities well, normocephalic, and was laughing with [place].  When charge asked her about her headache, she placed her hand up to her head and pushed on her head with her fist and said, "if I press on it like this I can tell its there." She said she could use some tylenol for her headache.  I evaluated the MAR and saw that she couldn't have tylenol again until after 3am. The only other pain medication option was fentanyl.  I chose not to give that so as to not  compromise her neuro intactness. RN called Elink to report event and see what further evaluation may need to be done.  MD said ground team would come and evaluate patient.  Patient is now resting comfortably.  Caregiver at bedside (who saw whole event happen).  RN will continue to monitor patient closely for further change.

## 2017-10-11 NOTE — Progress Notes (Addendum)
   Patient Name: Tonya Mcgee Date of Encounter: 10/11/2017, 10:27 AM    Subjective  Rough night w/ dyspnea No stools x 48 hrs   Objective  BP (!) 157/78 (BP Location: Left Arm)   Pulse 73   Temp 97.6 F (36.4 C) (Oral)   Resp (!) 22   Ht 5\' 2"  (1.575 m)   Wt 209 lb 7 oz (95 kg)   SpO2 98%   BMI 38.31 kg/m  Pale and acutely chronically ill  CBC Latest Ref Rng & Units 10/11/2017 10/10/2017 10/09/2017  WBC 4.0 - 10.5 K/uL 22.4(H) 18.9(H) 25.9(H)  Hemoglobin 12.0 - 15.0 g/dL 10/11/2017) 5.9(F) 6.3(W)  Hematocrit 36.0 - 46.0 % 27.6(L) 26.1(L) 27.3(L)  Platelets 150 - 400 K/uL 289 235 228     Assessment and Plan  Upper GI bleed - likely NSAID gastritis triggered by heparin for Afib which has stopped Acute blood loss anemia - stable Severe resp distress - PNA and cardiac issues  Bleeding has stopped Not a candidate for an elective EGD now - not sure she needs  rec - stay on bid PPI now and daily PPI at DC Avoid anti-coagulants I suspect 81-325 mg ASA is ok as long as on PPI Would not plan on an EGD here unless it will change treatment or is needed to stop bleeding Signing off now  If she does ok rest of stay and no need to do a diagnostic or therapeutic EGD then does not need GI f/u at dc but we are happy to see if necessary - PCP can manage the PPI, etc  Call 4.6(K back if ? Signing off   [place], MD, [facility] 10/11/2017 10:27 AM

## 2017-10-11 NOTE — Progress Notes (Signed)
PULMONARY / CRITICAL CARE MEDICINE   Name: Tonya Mcgee MRN: [id number] DOB: 08-15-1940    ADMISSION DATE:  10/04/2017  Interval Hx: On high flow nasal canula. She looks sleepy and says that she had a rough night she couldn't sleep.     CHIEF COMPLAINT:  Respiratory distress/H. influ CAP with bacteremia  HISTORY OF PRESENT ILLNESS:   This is a77 year old female with minimal past medical history other than hypertensionwho presented 4/24 with respiratory distress. According to her husband she had 1 day history of shortness of breath, congestion, orthopnea. She got out of bedthat eveningto go sleep in the recliner due to orthopnea and when husband awokeon the AM of admissionshe was minimally responsive. EMS placed her on BiPAP. In ER she had a labile blood pressure with SBP 60s at times. Normal WBC, afebrile, BNP 417, normal lactate.She has been maintained on BiPaP as she declines intubation unless she arrests. She denies prior OHD. Blood cultures are positive for H. Influ., for which she is on IV Zithromax and Azactam (beta-lactam allergy).  PAST MEDICAL HISTORY :  She  has a past medical history of Anemia, History of chicken pox, History of measles, History of mumps, Hypertension, and Rheumatoid arthritis (HCC).  PAST SURGICAL HISTORY: She  has a past surgical history that includes Carpal tunnel release (Right, 1990); Knee surgery (Left, 1989); Appendectomy (1960); Tonsillectomy (1946); Carotid doppler ultrasound (11/18/2013); pap smear (2012); mammogram screening (2012); Bone Density Study (2011); and Colonoscopy Screening (2011).  Allergies  Allergen Reactions  . Iodine Hives    Tolerated amiodarone 09/2017 ANTISEPTICS & DISINFECTANTS Throat swelling  . Sulfa Antibiotics     Vomiting and diarrhea  . Amoxicillin Hives  . Ciprofloxacin Hives  . Codeine Other (See Comments) and Hives    Weird dreams  . Keflex  [Cephalexin]   . Penicillins Hives  . Pravastatin Nausea  Only  . Tape     skin red irritated    No current facility-administered medications on file prior to encounter.    Current Outpatient Medications on File Prior to Encounter  Medication Sig  . albuterol (PROVENTIL HFA;VENTOLIN HFA) 108 (90 Base) MCG/ACT inhaler Inhale 2 puffs into the lungs every 6 (six) hours as needed for wheezing or shortness of breath.  Marland Kitchen aspirin 81 MG tablet Take 1 tablet by mouth daily.  Marland Kitchen atorvastatin (LIPITOR) 10 MG tablet TAKE 1 TABLET DAILY  . Ferrous Sulfate (IRON) 325 (65 Fe) MG TABS Take 1 tablet by mouth daily.  . furosemide (LASIX) 20 MG tablet Take 1 tablet (20 mg total) by mouth daily.  . hydroxychloroquine (PLAQUENIL) 200 MG tablet Take 2 tablets by mouth daily.  Marland Kitchen levothyroxine (SYNTHROID, LEVOTHROID) 100 MCG tablet TAKE ONE TABLET DAILY  . lisinopril-hydrochlorothiazide (PRINZIDE,ZESTORETIC) 10-12.5 MG tablet TAKE 1 TABLET DAILY  . Multiple Vitamins-Minerals (MULTIVITAMIN ADULT PO) Take 1 tablet by mouth daily.  . Omega 3 1200 MG CAPS Take 1 capsule by mouth daily.  . cyclobenzaprine (FLEXERIL) 5 MG tablet Take 1 tablet (5 mg total) by mouth at bedtime.  . naproxen (NAPROSYN) 500 MG tablet Take 1 tablet (500 mg total) by mouth 2 (two) times daily as needed.  . traMADol (ULTRAM) 50 MG tablet Take 1 tablet (50 mg total) by mouth every 8 (eight) hours as needed.    FAMILY HISTORY:  Her indicated that her mother is deceased. She indicated that her father is deceased. She indicated that her sister is alive. She indicated that her brother is alive.   SOCIAL HISTORY:  She  reports that she quit smoking about 39 years ago. Her smoking use included cigarettes. She has a 7.50 pack-year smoking history. She has never used smokeless tobacco. She reports that she drinks alcohol. She reports that she does not use drugs.  SUBJECTIVE:  Slightly more SOB at moment following placement of PICC line  VITAL SIGNS: BP (!) 126/93   Pulse 73   Temp 97.6 F (36.4 C)  (Oral)   Resp (!) 25   Ht 5\' 2"  (1.575 m)   Wt 95 kg (209 lb 7 oz)   SpO2 100%   BMI 38.31 kg/m   INTAKE / OUTPUT: I/O last 3 completed shifts: In: 2830.6 [P.O.:540; I.V.:1790.6; IV Piggyback:500] Out: 2650 [Urine:2650]  PHYSICAL EXAMINATION: General:  WD/WN WF mild resp distress Neuro:  A&O. No focal deficits HEENT:  [place]/AT PERRL EOMI OP-neg Cardiovascular:  RRR, no m/r/g Lungs: coarse crackles on the right clear on left Abdomen:  Supple, NT, no masses +BS. Rectal w/o masses. Stool sent for hemoccult Musculoskeletal:  No active joints Skin:  No C/C/E  LABS:  BMET Recent Labs  Lab 10/09/17 0445 10/10/17 0442 10/11/17 0350  NA 140 140 143  K 4.8 4.9 5.1  CL 108 107 110  CO2 24 23 24   BUN 82* 61* 55*  CREATININE 1.26* 1.06* 1.06*  GLUCOSE 153* 139* 131*    Electrolytes Recent Labs  Lab 10/05/17 1100 10/06/17 0709  10/09/17 0445 10/10/17 0442 10/11/17 0350  CALCIUM 8.1* 7.3*   < > 7.2* 7.5* 7.9*  MG 2.3 2.4  --   --   --  1.8  PHOS  --  5.3*  --   --   --   --    < > = values in this interval not displayed.    CBC Recent Labs  Lab 10/09/17 0445 10/10/17 0442 10/11/17 0350  WBC 25.9* 18.9* 22.4*  HGB 9.0* 8.7* 8.8*  HCT 27.3* 26.1* 27.6*  PLT 228 235 289    Coag's Recent Labs  Lab 10/04/17 1408 10/06/17 0709  APTT 22* 52*  INR 1.14 1.26    Sepsis Markers Recent Labs  Lab 10/04/17 1008 10/04/17 1408 10/04/17 1704 10/05/17 0710 10/06/17 0655 10/06/17 0709  LATICACIDVEN 1.71  --  1.3  --  1.7  --   PROCALCITON  --  15.94  --  23.83  --  16.63    ABG Recent Labs  Lab 10/05/17 0346 10/05/17 1226 10/06/17 0325  PHART 7.322* 7.339* 7.382  PCO2ART 31.6* 29.1* 31.6*  PO2ART 105.0 86.0 134.0*    Liver Enzymes Recent Labs  Lab 10/06/17 0709  AST 45*  ALT 31  ALKPHOS 94  BILITOT 0.6  ALBUMIN 1.8*    Cardiac Enzymes Recent Labs  Lab 10/04/17 1408  TROPONINI 0.05*    Glucose Recent Labs  Lab 10/06/17 1150  10/06/17 1643 10/06/17 1956 10/06/17 2351 10/07/17 0353 10/07/17 0733  GLUCAP 148* 146* 143* 130* 140* 156*    Imaging No results found.   STUDIES:  No recent  CULTURES: [facility] 4/26 NG to date  ANTIBIOTICS: Zitrhomax - last dose today Aztreoman >5/7  SIGNIFICANT EVENTS: No recent  LINES/TUBES: PICC LUE  DISCUSSION: 77 year old female withno prior hx OHDpresentedto the ED with respiratory failure, hypoxemia, BiPAP and septic shock from a RLL PNA. Blood cultures pos for H influ. 4/26 BC neg to date  ASSESSMENT / PLAN:  PULMONARY A: Acute hypoxemic respiratory failure  H influ CAP with bacteremia.  P:  On highflow [place]  Continue ABx Her respiratory status remain tenuous   CARDIOVASCULAR A:  AF with RVR. Maintained on Amiodarone P:  Continue as same  RENAL A:   Increasing BUN with dec Creat; lytes OK. Likely related to CS and +/- possible GIB P:   Trend BMP.  GASTROINTESTINAL A:   Acute blood loss anemia UGI bleed  P:   Positive occult blood  GI follow up Heparin on hold   HEMATOLOGIC A:   Acute blood loss anemia  P:  Transfused with blood   INFECTIOUS A:   H influ CAP P:   Aztreoman until 5/7  ENDOCRINE A:   Status   P:   Synthroid     Pulmonary and Critical Care Medicine [facility] Pager: [phone]  10/11/2017, 9:29 AM

## 2017-10-12 ENCOUNTER — Inpatient Hospital Stay (HOSPITAL_COMMUNITY): Payer: Medicare Other

## 2017-10-12 DIAGNOSIS — K922 Gastrointestinal hemorrhage, unspecified: Secondary | ICD-10-CM

## 2017-10-12 LAB — CBC WITH DIFFERENTIAL/PLATELET
BASOS PCT: 0 %
Basophils Absolute: 0 10*3/uL (ref 0.0–0.1)
EOS PCT: 0 %
Eosinophils Absolute: 0 10*3/uL (ref 0.0–0.7)
HEMATOCRIT: 28.9 % — AB (ref 36.0–46.0)
HEMOGLOBIN: 9.2 g/dL — AB (ref 12.0–15.0)
LYMPHS ABS: 0.9 10*3/uL (ref 0.7–4.0)
Lymphocytes Relative: 4 %
MCH: 30.4 pg (ref 26.0–34.0)
MCHC: 31.8 g/dL (ref 30.0–36.0)
MCV: 95.4 fL (ref 78.0–100.0)
MONO ABS: 0.7 10*3/uL (ref 0.1–1.0)
MONOS PCT: 3 %
Neutro Abs: 21.1 10*3/uL — ABNORMAL HIGH (ref 1.7–7.7)
Neutrophils Relative %: 93 %
Platelets: 339 10*3/uL (ref 150–400)
RBC: 3.03 MIL/uL — AB (ref 3.87–5.11)
RDW: 15.3 % (ref 11.5–15.5)
WBC: 22.7 10*3/uL — AB (ref 4.0–10.5)

## 2017-10-12 LAB — BASIC METABOLIC PANEL
Anion gap: 9 (ref 5–15)
BUN: 47 mg/dL — AB (ref 6–20)
CHLORIDE: 106 mmol/L (ref 101–111)
CO2: 26 mmol/L (ref 22–32)
CREATININE: 1.02 mg/dL — AB (ref 0.44–1.00)
Calcium: 8.1 mg/dL — ABNORMAL LOW (ref 8.9–10.3)
GFR calc Af Amer: 59 mL/min — ABNORMAL LOW (ref >60)
GFR calc non Af Amer: 51 mL/min — ABNORMAL LOW (ref >60)
Glucose, Bld: 125 mg/dL — ABNORMAL HIGH (ref 65–99)
POTASSIUM: 4.7 mmol/L (ref 3.5–5.1)
Sodium: 141 mmol/L (ref 135–145)

## 2017-10-12 MED ORDER — METHYLPREDNISOLONE SODIUM SUCC 40 MG IJ SOLR
40.0000 mg | INTRAMUSCULAR | Status: DC
  Administered 2017-10-12 – 2017-10-14 (×3): 40 mg via INTRAVENOUS
  Filled 2017-10-12 (×4): qty 1

## 2017-10-12 MED ORDER — FENTANYL CITRATE (PF) 100 MCG/2ML IJ SOLN: 12.5000 ug | INTRAMUSCULAR | Status: DC | PRN

## 2017-10-12 NOTE — Progress Notes (Signed)
eLink Physician-Brief Progress Note Patient Name: Tonya Mcgee DOB: August 31, 1940 MRN: [id number]   Date of Service  10/12/2017  HPI/Events of Note  Sats mostly 90-92 % with occasional dips to 88-89 %. Patient with good diuresis to lasix earlier today. CXR show areas of atelectasis.  eICU Interventions  Trial of Incentive Spirometer for lung recruitment then reassess. Pt may need additional diuresis but currently looks comfortable and sats holding at 92 %.     Intervention Category Intermediate Interventions: Respiratory distress - evaluation and management  Migdalia Dk 10/12/2017, 7:45 PM

## 2017-10-12 NOTE — Care Management Note (Signed)
Case Management Note Donn Pierini RN, BSN Unit 4E-Case Manager- 2H coverage [phone]  Patient Details  Name: Tonya Mcgee MRN: [id number] Date of Birth: [birth year]  Subjective/Objective:   Pt admitted with respiratory distress/H. influ CAP with bacteremia                  Action/Plan: PTA Pt lived at home with spouse- referral received regarding FMLA and private duty needs. 5/1- discussed FMLA paperwork with bedside RN- family will need to f/u with pt's PCP regarding FMLA paperwork needs. 5/2- spoke with pt and spouse at bedside for transition of care needs- per conversation spouse states that he has needed info on private duty needs- discussed possible HH needs vs rehab needs pending patients progress- pt reports she would prefer to return home. Pt has needed DME at home, would be open to [facility] if needed- CM to follow for pt progress and transition needs.   Expected Discharge Date:  10/07/17               Expected Discharge Plan:  Home w Home Health Services  In-House Referral:     Discharge planning Services  CM Consult  Post Acute Care Choice:    Choice offered to:     DME Arranged:    DME Agency:     HH Arranged:    HH Agency:     Status of Service:  In process, will continue to follow  If discussed at Long Length of Stay Meetings, dates discussed:    Discharge Disposition:   Additional Comments:  Darrold Span, RN 10/12/2017, 3:57 PM

## 2017-10-12 NOTE — Progress Notes (Signed)
Called Elink MD regarding low sats 87-90% with increased WOB.  O2 increased from 5L to 6L HFNC.  No new orders given. CCM MD will follow up with plan and call RN back.

## 2017-10-12 NOTE — Progress Notes (Signed)
PULMONARY / CRITICAL CARE MEDICINE   Name: Tonya Mcgee MRN: [id number] DOB: 03/05/1941    ADMISSION DATE:  10/04/2017  Interval Hx: Down to 4 litres [place] looks very weak. Has a lot of anxiety. Low appetite.    CHIEF COMPLAINT:  Respiratory distress/H. influ CAP with bacteremia  HISTORY OF PRESENT ILLNESS:   This is a77 year old female with minimal past medical history other than hypertensionwho presented 4/24 with respiratory distress. According to her husband she had 1 day history of shortness of breath, congestion, orthopnea. She got out of bedthat eveningto go sleep in the recliner due to orthopnea and when husband awokeon the AM of admissionshe was minimally responsive. EMS placed her on BiPAP. In ER she had a labile blood pressure with SBP 60s at times. Normal WBC, afebrile, BNP 417, normal lactate.She has been maintained on BiPaP as she declines intubation unless she arrests. She denies prior OHD. Blood cultures are positive for H. Influ., for which she is on IV Zithromax and Azactam (beta-lactam allergy).  PAST MEDICAL HISTORY :  She  has a past medical history of Anemia, History of chicken pox, History of measles, History of mumps, Hypertension, and Rheumatoid arthritis (HCC).  PAST SURGICAL HISTORY: She  has a past surgical history that includes Carpal tunnel release (Right, 1990); Knee surgery (Left, 1989); Appendectomy (1960); Tonsillectomy (1946); Carotid doppler ultrasound (11/18/2013); pap smear (2012); mammogram screening (2012); Bone Density Study (2011); and Colonoscopy Screening (2011).  Allergies  Allergen Reactions  . Iodine Hives    Tolerated amiodarone 09/2017 ANTISEPTICS & DISINFECTANTS Throat swelling  . Sulfa Antibiotics     Vomiting and diarrhea  . Amoxicillin Hives  . Ciprofloxacin Hives  . Codeine Other (See Comments) and Hives    Weird dreams  . Keflex  [Cephalexin]   . Penicillins Hives  . Pravastatin Nausea Only  . Tape     skin red  irritated    No current facility-administered medications on file prior to encounter.    Current Outpatient Medications on File Prior to Encounter  Medication Sig  . albuterol (PROVENTIL HFA;VENTOLIN HFA) 108 (90 Base) MCG/ACT inhaler Inhale 2 puffs into the lungs every 6 (six) hours as needed for wheezing or shortness of breath.  Marland Kitchen aspirin 81 MG tablet Take 1 tablet by mouth daily.  Marland Kitchen atorvastatin (LIPITOR) 10 MG tablet TAKE 1 TABLET DAILY  . Ferrous Sulfate (IRON) 325 (65 Fe) MG TABS Take 1 tablet by mouth daily.  . furosemide (LASIX) 20 MG tablet Take 1 tablet (20 mg total) by mouth daily.  . hydroxychloroquine (PLAQUENIL) 200 MG tablet Take 2 tablets by mouth daily.  Marland Kitchen levothyroxine (SYNTHROID, LEVOTHROID) 100 MCG tablet TAKE ONE TABLET DAILY  . lisinopril-hydrochlorothiazide (PRINZIDE,ZESTORETIC) 10-12.5 MG tablet TAKE 1 TABLET DAILY  . Multiple Vitamins-Minerals (MULTIVITAMIN ADULT PO) Take 1 tablet by mouth daily.  . Omega 3 1200 MG CAPS Take 1 capsule by mouth daily.  . cyclobenzaprine (FLEXERIL) 5 MG tablet Take 1 tablet (5 mg total) by mouth at bedtime.  . naproxen (NAPROSYN) 500 MG tablet Take 1 tablet (500 mg total) by mouth 2 (two) times daily as needed.  . traMADol (ULTRAM) 50 MG tablet Take 1 tablet (50 mg total) by mouth every 8 (eight) hours as needed.    FAMILY HISTORY:  Her indicated that her mother is deceased. She indicated that her father is deceased. She indicated that her sister is alive. She indicated that her brother is alive.   SOCIAL HISTORY: She  reports that she  quit smoking about 39 years ago. Her smoking use included cigarettes. She has a 7.50 pack-year smoking history. She has never used smokeless tobacco. She reports that she drinks alcohol. She reports that she does not use drugs.  SUBJECTIVE:  Slightly more SOB at moment following placement of PICC line  VITAL SIGNS: BP (!) 145/63   Pulse 73   Temp 97.9 F (36.6 C) (Oral)   Resp 17   Ht 5\' 2"   (1.575 m)   Wt 95 kg (209 lb 7 oz)   SpO2 95%   BMI 38.31 kg/m   INTAKE / OUTPUT: I/O last 3 completed shifts: In: 1607.1 [P.O.:240; I.V.:867.1; IV Piggyback:500] Out: 4100 [Urine:4100]  PHYSICAL EXAMINATION: General:  WD/WN WF no resp distress Neuro:  A&O. No focal deficits HEENT:  [place]/AT PERRL EOMI OP-neg Cardiovascular:  RRR, no m/r/g Lungs: decreased air sounds and   v coarse crackles on the right clear on left Abdomen:  Supple, NT, no masses +BS. Rectal w/o masses. Stool sent for hemoccult Musculoskeletal:  No active joints Skin:  No C/C/E  LABS:  BMET Recent Labs  Lab 10/10/17 0442 10/11/17 0350 10/12/17 0334  NA 140 143 141  K 4.9 5.1 4.7  CL 107 110 106  CO2 23 24 26   BUN 61* 55* 47*  CREATININE 1.06* 1.06* 1.02*  GLUCOSE 139* 131* 125*    Electrolytes Recent Labs  Lab 10/05/17 1100 10/06/17 0709  10/10/17 0442 10/11/17 0350 10/12/17 0334  CALCIUM 8.1* 7.3*   < > 7.5* 7.9* 8.1*  MG 2.3 2.4  --   --  1.8  --   PHOS  --  5.3*  --   --   --   --    < > = values in this interval not displayed.    CBC Recent Labs  Lab 10/10/17 0442 10/11/17 0350 10/12/17 0334  WBC 18.9* 22.4* 22.7*  HGB 8.7* 8.8* 9.2*  HCT 26.1* 27.6* 28.9*  PLT 235 289 339    Coag's Recent Labs  Lab 10/06/17 0709  APTT 52*  INR 1.26    Sepsis Markers Recent Labs  Lab 10/06/17 0655 10/06/17 0709  LATICACIDVEN 1.7  --   PROCALCITON  --  16.63    ABG Recent Labs  Lab 10/05/17 1226 10/06/17 0325  PHART 7.339* 7.382  PCO2ART 29.1* 31.6*  PO2ART 86.0 134.0*    Liver Enzymes Recent Labs  Lab 10/06/17 0709  AST 45*  ALT 31  ALKPHOS 94  BILITOT 0.6  ALBUMIN 1.8*    Cardiac Enzymes No results for input(s): TROPONINI, PROBNP in the last 168 hours.  Glucose Recent Labs  Lab 10/06/17 1150 10/06/17 1643 10/06/17 1956 10/06/17 2351 10/07/17 0353 10/07/17 0733  GLUCAP 148* 146* 143* 130* 140* 156*    Imaging No results found.   STUDIES:  No  recent  CULTURES: [facility] 4/26 NG to date  ANTIBIOTICS: Zitrhomax - last dose today Aztreoman >5/7  SIGNIFICANT EVENTS: No recent  LINES/TUBES: PICC LUE  DISCUSSION: 77 year old female withno prior hx OHDpresentedto the ED with respiratory failure, hypoxemia, BiPAP and septic shock from a RLL PNA. Blood cultures pos for H influ. 4/26 BC neg to date  ASSESSMENT / PLAN:  PULMONARY A: Acute hypoxemic respiratory failure  H influ CAP with bacteremia.  P:   On 4 litres [place] now  Continue ABx   CARDIOVASCULAR A:  AF with RVR. Maintained on Amiodarone P:  Continue as same  RENAL A:   Improving BUN  P:   Trend BMP.  GASTROINTESTINAL A:   Acute blood loss anemia UGI bleed  P:   Positive occult blood  GI no EGD indicated  Heparin on hold   HEMATOLOGIC A:   Acute blood loss anemia Leukocytosis   P:  Transfused with blood Now Hb stable   Lower IV steroids   INFECTIOUS A:   H influ CAP P:   Aztreoman until 5/7  ENDOCRINE A:   Status   P:   Synthroid   Patient can be downgraded to step down    Pulmonary and Critical Care Medicine [facility] Pager: [phone]  10/12/2017, 9:16 AM

## 2017-10-12 NOTE — Progress Notes (Addendum)
Progress Note  Patient Name: Tonya Mcgee Date of Encounter: 10/12/2017  Primary Cardiologist: No primary care provider on file.  Subjective   Sitting up in bed. No complaints. Husband at the bedside.   Inpatient Medications    Scheduled Meds: . amiodarone  400 mg Oral BID  . budesonide (PULMICORT) nebulizer solution  0.5 mg Nebulization BID  . chlorhexidine  15 mL Mouth Rinse BID  . Chlorhexidine Gluconate Cloth  6 each Topical Daily  . feeding supplement  1 Container Oral TID BM  . furosemide  40 mg Intravenous Q12H  . guaiFENesin  600 mg Oral BID  . hydroxychloroquine  400 mg Oral Daily  . levothyroxine  100 mcg Oral QAC breakfast  . mouth rinse  15 mL Mouth Rinse q12n4p  . [START ON 10/13/2017] methylPREDNISolone (SOLU-MEDROL) injection  40 mg Intravenous Q24H  . pantoprazole  40 mg Oral BID  . sodium chloride flush  10-40 mL Intracatheter Q12H   Continuous Infusions: . aztreonam Stopped (10/12/17 9833)  . dextrose 5 % and 0.45% NaCl 25 mL/hr at 10/12/17 0800   PRN Meds: acetaminophen, albuterol, fentaNYL (SUBLIMAZE) injection, magic mouthwash w/lidocaine, ondansetron, polyethylene glycol, senna-docusate, sodium chloride flush, sorbitol, traMADol   Vital Signs    Vitals:   10/12/17 0733 10/12/17 0800 10/12/17 0900 10/12/17 0921  BP:  (!) 146/72 (!) 155/70   Pulse:  76 75   Resp:  (!) 23 (!) 22   Temp:    98.5 F (36.9 C)  TempSrc:    Oral  SpO2: 95% 91% 93%   Weight:      Height:        Intake/Output Summary (Last 24 hours) at 10/12/2017 1022 Last data filed at 10/12/2017 1000 Gross per 24 hour  Intake 887.08 ml  Output 4325 ml  Net -3437.92 ml   Filed Weights   10/05/17 0900  Weight: 209 lb 7 oz (95 kg)    Telemetry    SR - Personally Reviewed  ECG    SR with continues T wave changes in inferior leads, stable QT - Personally Reviewed  Physical Exam   General: Frail older W, female appearing in no acute distress. Head: Normocephalic,  atraumatic.  Neck: Supple, no JVD. Lungs:  Resp regular but mildly labored, exp wheezing noted throughout. Heart: RRR, S1, S2, no murmur; no rub. Abdomen: Soft, non-tender, non-distended with normoactive bowel sounds.  Extremities: No clubbing, cyanosis, 1+ LE edema. Distal pedal pulses are 2+ bilaterally. Neuro: Alert and oriented X 3. Moves all extremities spontaneously. Psych: Normal affect.  Labs    Chemistry Recent Labs  Lab 10/06/17 [phone]  10/10/17 0442 10/11/17 0350 10/12/17 0334  NA 138   < > 140 143 141  K 4.0   < > 4.9 5.1 4.7  CL 104   < > 107 110 106  CO2 21*   < > 23 24 26   GLUCOSE 145*   < > 139* 131* 125*  BUN 77*   < > 61* 55* 47*  CREATININE 1.85*   < > 1.06* 1.06* 1.02*  CALCIUM 7.3*   < > 7.5* 7.9* 8.1*  PROT 5.5*  --   --   --   --   ALBUMIN 1.8*  --   --   --   --   AST 45*  --   --   --   --   ALT 31  --   --   --   --   ALKPHOS 94  --   --   --   --  BILITOT 0.6  --   --   --   --   GFRNONAA 25*   < > 49* 49* 51*  GFRAA 29*   < > 57* 57* 59*  ANIONGAP 13   < > 10 9 9    < > = values in this interval not displayed.     Hematology Recent Labs  Lab 10/10/17 0442 10/11/17 0350 10/12/17 0334  WBC 18.9* 22.4* 22.7*  RBC 2.77* 2.90* 3.03*  HGB 8.7* 8.8* 9.2*  HCT 26.1* 27.6* 28.9*  MCV 94.2 95.2 95.4  MCH 31.4 30.3 30.4  MCHC 33.3 31.9 31.8  RDW 15.9* 15.6* 15.3  PLT 235 289 339    Cardiac EnzymesNo results for input(s): TROPONINI in the last 168 hours. No results for input(s): TROPIPOC in the last 168 hours.   BNP Recent Labs  Lab 10/06/17 0655  BNP 986.7*     DDimer No results for input(s): DDIMER in the last 168 hours.    Radiology    Dg Chest Port 1 View  Result Date: 10/12/2017 CLINICAL DATA:  Shortness of breath, respiratory failure. EXAM: PORTABLE CHEST 1 VIEW COMPARISON:  Radiograph of Oct 11, 2017. FINDINGS: Stable cardiomediastinal silhouette. Right-sided PICC line is unchanged in position. No pneumothorax is noted. Stable  right upper lobe opacity is noted concerning for pneumonia. Stable left basilar opacity is noted concerning for atelectasis or infiltrate. Bony thorax is unremarkable. No significant pleural effusion is noted. IMPRESSION: Stable bilateral lung opacities as described above. Electronically Signed   By: Oct 13, 2017, M.D.   On: 10/12/2017 09:14   Dg Chest Port 1 View  Result Date: 10/11/2017 CLINICAL DATA:  Respiratory failure. EXAM: PORTABLE CHEST 1 VIEW COMPARISON:  Radiographs of October 10, 2017. FINDINGS: Stable cardiomediastinal silhouette. Distal tip of right-sided PICC line is in expected location of cavoatrial junction. Hypoinflation of the lungs is noted. No pneumothorax is noted. Stable right upper lobe airspace opacity is noted consistent with pneumonia. Stable left basilar opacity is noted concerning for atelectasis or infiltrate. Bony thorax is unremarkable. IMPRESSION: Stable right upper lobe airspace opacity is noted consistent with pneumonia. Stable left basilar atelectasis or infiltrate is noted. Hypoinflation of the lungs. Electronically Signed   By: Oct 12, 2017, M.D.   On: 10/11/2017 10:14    Cardiac Studies   Echo 10-04-2017: Study Conclusions  - Left ventricle: The cavity size was normal. Systolic function was vigorous. The estimated ejection fraction was in the range of 65% to 70%. Wall motion was normal; there were no regional wall motion abnormalities. Doppler parameters are consistent with abnormal left ventricular relaxation (grade 1 diastolic dysfunction). Doppler parameters are consistent with high ventricular filling pressure. - Aortic valve: Valve area (VTI): 2.57 cm^2. Valve area (Vmax): 2.64 cm^2. Valve area (Vmean): 2.35 cm^2. - Mitral valve: Calcified annulus. - Left atrium: The atrium was mildly dilated.  Impressions:  - Vigorous LV systolic function; mild diastolic dysfunction with elevated LV filling pressure; mildly elevated LVOT  velocity of 2.3 m/s likely related to vigorous LV function; mild LAE; trace MR and TR.  Patient Profile     77 y.o. female with acute respiratory failure who developed AF with RVR, now converted to sinus rhythm on IV amiodarone.   Assessment & Plan    1. Afib RVR: remains in SR on PO amiodarone 400mg  BID (day 4). EKG this morning with stable QT interval. She is not on OAC 2/2 to GI bleed.   2. CAP/Influenza with hypoxia and septic  shock: Required the use of Bipap, now on [place]. Breathing continues to be labored today, but she does feel like she is improving. CXR this morning with stable lung opacities. PCCM managing.   3. Acute on chronic diastolic HF: Still with LE edema but seems to be improving. She had 4L UOP yesterday. Would continue with IV lasix through today, renal function is stable. CVP 11 this morning.   4. Anemia: Hgb up to 9.2 today. No signs of active bleeding.   Signed, Laverda Page, NP  10/12/2017, 10:22 AM  Pager # [phone]   For questions or updates, please contact [facility] Please consult [email or web address] for contact info under Cardiology/STEMI.   Patient seen, examined. Available data reviewed. Agree with findings, assessment, and plan as outlined by Laverda Page, NP-C.  On my exam the patient is alert and oriented, in no acute distress on O2 per nasal cannula.  Lung fields are clear.  Heart is regular rate and rhythm with some premature beats noted.  Abdomen is soft and nontender.  Extremities have 1+ lower extremity edema.  The patient is diuresing well with IV Lasix and I agree with continuing this for at least another 24 hours.  Will reassess labs tomorrow.  No evidence of recurrent atrial fibrillation on oral amiodarone.  The patient is not currently a candidate for anticoagulation because of GI bleeding.  Tonny Bollman, M.D. 10/12/2017 12:47 PM

## 2017-10-13 LAB — BASIC METABOLIC PANEL
ANION GAP: 9 (ref 5–15)
BUN: 46 mg/dL — ABNORMAL HIGH (ref 6–20)
CHLORIDE: 103 mmol/L (ref 101–111)
CO2: 30 mmol/L (ref 22–32)
Calcium: 8.3 mg/dL — ABNORMAL LOW (ref 8.9–10.3)
Creatinine, Ser: 1.03 mg/dL — ABNORMAL HIGH (ref 0.44–1.00)
GFR calc Af Amer: 59 mL/min — ABNORMAL LOW (ref >60)
GFR calc non Af Amer: 51 mL/min — ABNORMAL LOW (ref >60)
Glucose, Bld: 110 mg/dL — ABNORMAL HIGH (ref 65–99)
POTASSIUM: 4.5 mmol/L (ref 3.5–5.1)
Sodium: 142 mmol/L (ref 135–145)

## 2017-10-13 LAB — CBC WITH DIFFERENTIAL/PLATELET
BASOS ABS: 0 10*3/uL (ref 0.0–0.1)
Basophils Relative: 0 %
EOS ABS: 0 10*3/uL (ref 0.0–0.7)
EOS PCT: 0 %
HCT: 30.7 % — ABNORMAL LOW (ref 36.0–46.0)
HEMOGLOBIN: 9.7 g/dL — AB (ref 12.0–15.0)
LYMPHS ABS: 0.7 10*3/uL (ref 0.7–4.0)
LYMPHS PCT: 3 %
MCH: 30.3 pg (ref 26.0–34.0)
MCHC: 31.6 g/dL (ref 30.0–36.0)
MCV: 95.9 fL (ref 78.0–100.0)
Monocytes Absolute: 0.5 10*3/uL (ref 0.1–1.0)
Monocytes Relative: 2 %
NEUTROS PCT: 95 %
Neutro Abs: 22.3 10*3/uL — ABNORMAL HIGH (ref 1.7–7.7)
PLATELETS: 385 10*3/uL (ref 150–400)
RBC: 3.2 MIL/uL — AB (ref 3.87–5.11)
RDW: 15.3 % (ref 11.5–15.5)
WBC: 23.5 10*3/uL — AB (ref 4.0–10.5)

## 2017-10-13 MED ORDER — ALPRAZOLAM 0.5 MG PO TABS
0.5000 mg | ORAL_TABLET | Freq: Three times a day (TID) | ORAL | Status: DC | PRN
  Administered 2017-10-13 – 2017-10-15 (×5): 0.5 mg via ORAL
  Filled 2017-10-13 (×6): qty 1

## 2017-10-13 NOTE — Progress Notes (Signed)
Progress Note  Patient Name: Tonya Mcgee Date of Encounter: 10/13/2017  Primary Cardiologist: No primary care provider on file.   Subjective   Still with some shortness of breath. Overall no change last 24 hours. No chest pain or other complaints.   Inpatient Medications    Scheduled Meds: . amiodarone  400 mg Oral BID  . budesonide (PULMICORT) nebulizer solution  0.5 mg Nebulization BID  . chlorhexidine  15 mL Mouth Rinse BID  . Chlorhexidine Gluconate Cloth  6 each Topical Daily  . feeding supplement  1 Container Oral TID BM  . furosemide  40 mg Intravenous Q12H  . guaiFENesin  600 mg Oral BID  . levothyroxine  100 mcg Oral QAC breakfast  . mouth rinse  15 mL Mouth Rinse q12n4p  . methylPREDNISolone (SOLU-MEDROL) injection  40 mg Intravenous Q24H  . pantoprazole  40 mg Oral BID  . sodium chloride flush  10-40 mL Intracatheter Q12H   Continuous Infusions: . aztreonam 1 g (10/13/17 1349)   PRN Meds: acetaminophen, albuterol, ALPRAZolam, fentaNYL (SUBLIMAZE) injection, magic mouthwash w/lidocaine, ondansetron, polyethylene glycol, senna-docusate, sodium chloride flush, sorbitol, traMADol   Vital Signs    Vitals:   10/13/17 1112 10/13/17 1200 10/13/17 1234 10/13/17 1241  BP: (!) 176/75  (!) 142/62   Pulse: 73 69 68   Resp: 18 17 (!) 21   Temp:    97.8 F (36.6 C)  TempSrc:      SpO2: 95% 95% 96%   Weight:      Height:        Intake/Output Summary (Last 24 hours) at 10/13/2017 1356 Last data filed at 10/13/2017 1200 Gross per 24 hour  Intake 450 ml  Output 5000 ml  Net -4550 ml   Filed Weights   10/05/17 0900  Weight: 209 lb 7 oz (95 kg)    Telemetry    Sinus rhythm - Personally Reviewed   Physical Exam  Elderly woman in NAD, on O2 GEN: No acute distress.   Neck: No JVD Cardiac: RRR, no murmurs, rubs, or gallops.  Respiratory: Clear to auscultation bilaterally. GI: Soft, nontender, non-distended  MS: No edema; No deformity. Neuro:  Nonfocal    Psych: Normal affect   Labs    Chemistry Recent Labs  Lab 10/11/17 0350 10/12/17 0334 10/13/17 0409  NA 143 141 142  K 5.1 4.7 4.5  CL 110 106 103  CO2 24 26 30   GLUCOSE 131* 125* 110*  BUN 55* 47* 46*  CREATININE 1.06* 1.02* 1.03*  CALCIUM 7.9* 8.1* 8.3*  GFRNONAA 49* 51* 51*  GFRAA 57* 59* 59*  ANIONGAP 9 9 9      Hematology Recent Labs  Lab 10/11/17 0350 10/12/17 0334 10/13/17 0409  WBC 22.4* 22.7* 23.5*  RBC 2.90* 3.03* 3.20*  HGB 8.8* 9.2* 9.7*  HCT 27.6* 28.9* 30.7*  MCV 95.2 95.4 95.9  MCH 30.3 30.4 30.3  MCHC 31.9 31.8 31.6  RDW 15.6* 15.3 15.3  PLT 289 339 385    Cardiac EnzymesNo results for input(s): TROPONINI in the last 168 hours. No results for input(s): TROPIPOC in the last 168 hours.   BNPNo results for input(s): BNP, PROBNP in the last 168 hours.   DDimer No results for input(s): DDIMER in the last 168 hours.   Radiology    Dg Chest Port 1 View  Result Date: 10/12/2017 CLINICAL DATA:  Shortness of breath, respiratory failure. EXAM: PORTABLE CHEST 1 VIEW COMPARISON:  Radiograph of Oct 11, 2017. FINDINGS: Stable cardiomediastinal  silhouette. Right-sided PICC line is unchanged in position. No pneumothorax is noted. Stable right upper lobe opacity is noted concerning for pneumonia. Stable left basilar opacity is noted concerning for atelectasis or infiltrate. Bony thorax is unremarkable. No significant pleural effusion is noted. IMPRESSION: Stable bilateral lung opacities as described above. Electronically Signed   By: Lupita Raider, M.D.   On: 10/12/2017 09:14    Cardiac Studies   2D Echo: Study Conclusions  - Left ventricle: The cavity size was normal. Systolic function was   vigorous. The estimated ejection fraction was in the range of 65%   to 70%. Wall motion was normal; there were no regional wall   motion abnormalities. Doppler parameters are consistent with   abnormal left ventricular relaxation (grade 1 diastolic   dysfunction).  Doppler parameters are consistent with high   ventricular filling pressure. - Aortic valve: Valve area (VTI): 2.57 cm^2. Valve area (Vmax):   2.64 cm^2. Valve area (Vmean): 2.35 cm^2. - Mitral valve: Calcified annulus. - Left atrium: The atrium was mildly dilated.  Impressions:  - Vigorous LV systolic function; mild diastolic dysfunction with   elevated LV filling pressure; mildly elevated LVOT velocity of   2.3 m/s likely related to vigorous LV function; mild LAE; trace   MR and TR.  Patient Profile     77 y.o. female with acute respiratory failure who developed AF with RVR, now converted to sinus rhythm on IV amiodarone.   Assessment & Plan    1.  Atrial fibrillation with RVR: The patient is maintaining sinus rhythm on amiodarone.  She is not able to take an oral anticoagulant drug because of anemia and GI bleeding.  See previous notes.  2.  Acute on chronic diastolic heart failure: Complicated by community-acquired pneumonia and influenza.  The patient's respiratory status seems to be slowly improving.  She continues to diurese well on furosemide 40 mg IV twice daily with 5 L of urine output over the last 24 hours.  Would continue as tolerated with daily follow-up of her metabolic panel.  For questions or updates, please contact [facility] Please consult [email or web address] for contact info under Cardiology/STEMI.      Signed, Tonny Bollman, MD  10/13/2017, 1:56 PM

## 2017-10-13 NOTE — Plan of Care (Signed)
  Problem: Elimination: Goal: Will not experience complications related to urinary retention Outcome: Progressing Note:  Pt is making adequate urine output.    Problem: Activity: Goal: Risk for activity intolerance will decrease Outcome: Not Progressing Note:  Pt unable to tolerate being out of bed for extended periods of time. Pt also too weak to get out of bed without the sky lift.    Problem: Respiratory: Goal: Ability to maintain adequate ventilation will improve Outcome: Not Progressing Note:  Pt was unable to maintain oxygen saturations above 90% today. Oxygen via HFNC increased from 6L to 8L, without being able to titrate back down.

## 2017-10-13 NOTE — Progress Notes (Signed)
Pharmacy Antibiotic Note  Tonya Mcgee is a 77 y.o. female admitted on 10/04/2017 with sepsis, PNA. BCID grew H.Flu and ABX changed to aztreonam and azithromycin.  Azithromycin course complete 4/28. Patient has tolerated Levaquin in the past. Per MD patient has had some increased work of breathing over the past few days, but likely related to anxiety. Alprazolam PRN added on 5/3.  Currently Afebrile, WBC elevated 23 but remains on methylprednisone, Scr improved over course of stay from 2.2 >1.03.  Plan: Aztreonam 1gm iv q8h - 4/24> (5/7) Consider narrowing antibiotics to Levaquin for remainder of stay Monitor clinic progress, WBC, TMax, renal function, electrolytes F/u future de-escalation, & length of therapy  Height: 5\' 2"  (157.5 cm) Weight: 209 lb 7 oz (95 kg) IBW/kg (Calculated) : 50.1  Temp (24hrs), Avg:98 F (36.7 C), Min:97.4 F (36.3 C), Max:98.6 F (37 C)  Recent Labs  Lab 10/09/17 0445 10/10/17 0442 10/11/17 0350 10/12/17 0334 10/13/17 0409  WBC 25.9* 18.9* 22.4* 22.7* 23.5*  CREATININE 1.26* 1.06* 1.06* 1.02* 1.03*    Estimated Creatinine Clearance: 48.4 mL/min (A) (by C-G formula based on SCr of 1.03 mg/dL (H)).    Allergies  Allergen Reactions  . Iodine Hives    Tolerated amiodarone 09/2017 ANTISEPTICS & DISINFECTANTS Throat swelling  . Sulfa Antibiotics     Vomiting and diarrhea  . Amoxicillin Hives  . Ciprofloxacin Hives  . Codeine Other (See Comments) and Hives    Weird dreams  . Keflex  [Cephalexin]   . Penicillins Hives  . Pravastatin Nausea Only  . Tape     skin red irritated   Antimicrobials this admission:  Azithro 4/24 >> 4/28 Meropenem 4/24 >> 4/25 Vancomycin 4/24 >> 4/25 Aztreonam 4/25 >>   Dose adjustments this admission:  n/a  Microbiology results:  4/25 MRSA PCR: negative  4/24 UCx: NG final 4/24 BCx: H.flu (4/4) 4/25 BCx: NG<12h 4/26 BCx: NGTD  5/26, PharmD PGY1 Acute Care Pharmacy Resident Pager: [phone]  10/13/2017 7:45 AM

## 2017-10-13 NOTE — Plan of Care (Signed)
Patient continues to have episodes of anxiety, affecting her work of breathing. O2 sats currently mainting at 92 after interventions as previous note indicates.  Monitoring continues.

## 2017-10-13 NOTE — Progress Notes (Addendum)
PROGRESS NOTE    Patient: Tonya Mcgee     PCP: Malva Limes, MD                    DOB: [birth year]            DOA: 10/04/2017 ZOX:[id number]             DOS: 10/13/2017, 7:34 AM   LOS: 9 days   Date of Service: The patient was seen and examined on 10/13/2017  Subjective:   Patient was seen and examined this morning, mildly lethargic, but was able to open her eyes and follow commands.  Husband present at bedside. On 8 L of oxygen, satting greater than 95%, But patient had some anxiety earlier this morning per nursing staff.   She received tramadol, Xanax which has calm her down. No other issues overnight  ----------------------------------------------------------------------------------------------------------------------  Brief Narrative:   Tonya Mcgee is a 77 year old female with past medical history of chronic Anemia, hypertension,Rheumatoid arthritis (on Palqenil). who presented 4/24 with respiratory distress. According to her husband she had 1 day history of shortness of breath, congestion, orthopnea. She was minimally responsive. EMS placed her on BiPAP. In ER she had a labile blood pressure with SBP 60s at times. Normal WBC, afebrile, BNP 417, normal lactate.She has been maintained on BiPaP as she declined she was intubation  She denies prior OHD. Blood cultures are positive for H. Influ., for which she is on IV Zithromax and Azactam (beta-lactam allergy). __________________________________________________________________________________  Active Problems:   Antiphospholipid syndrome (HCC)   Rheumatoid arthritis (HCC)   Community acquired pneumonia   Acute congestive heart failure (HCC)   Pulmonary infiltrate   Acute renal failure (HCC)   SOB (shortness of breath)   Acute respiratory failure with hypoxia (HCC)   Atrial fibrillation with rapid ventricular response (HCC)   Acute upper GI bleed   Acute blood loss anemia   Acute diastolic CHF (congestive heart  failure) (HCC)   Assessment & Plan:   Respiratory failure/acute hypoxemia due to pneumonia, sepsis, septic shock -Patient was continued and monitored closely on BiPAP, did not require intubation. -Patient responded well to breathing treatments, was subsequently weaned off BiPAP currently on O2 on nasal cannula 4 L of oxygen, satting greater than 90% -Continue pulmonary toiletry -Continue DuoNeb bronchodilator treatment,, cold, -Continue IV Solu-Medrol with taper down accordingly  CAP/Influenza with hypoxia and septic shock: -Per screening cultures H. influenzae influenza, -Follow blood cultures, continue current antibiotic with Azactam -was successfully weaned off BiPAP to nasal cannula no.  Afib RVR:  -Cardiology following, managing -remains in SR on PO amiodarone 400mg  BID (day 4). EKG this morning with stable QT interval.  -Not a candidate for chronic anticoagulation due to GI bleed  Acute on chronic diastolic HF:  -Improving, -Cardiology following, continue with daily weights, -Currently on IV Lasix  Acute blood loss anemia anemia:  -Per PCCM note patient was transfused with blood -H&H has been stable GI was consulted indication for EGD or colonoscopy, to have signed out -Continue PPI  Acute renal insufficiency -Likely secondary to sepsis -Monitoring BUN/creatinine, improving  GI bleed -Clear upper GI bleed, resolved -Patient was on heparin drip, subsequently was DC'd,  -Following H&H stable -GI was consulted, did not indicate that he further work-up or EGD at this time. -Continue PPI for recommendations   Hypothyroidism -Continue Synthroid  Anxiety -Continue as needed Xanax  History of rheumatoid arthritis -Holding her home medication of Plaquenil for the   DVT prophylaxis:  SCDs/compression stockings     Code Status:         Full code  Family Communication:  The above findings and plan of care has been discussed with patient and the patient's  husband at the bedside in detail, heexpressed understanding and agreement of above.  Disposition Plan:  > 3 days               ? Home with home health vs rehab vs SNF            Consultants:   card   Antimicrobials:  Azithromycin completed on 4/28 Continue Azactam 4/24 >>>till 5/7  Anti-infectives (From admission, onward)   Start     Dose/Rate Route Frequency Ordered Stop   10/05/17 1400  aztreonam (AZACTAM) 1 g in sodium chloride 0.9 % 100 mL IVPB     1 g 200 mL/hr over 30 Minutes Intravenous Every 8 hours 10/05/17 1031     10/05/17 1330  vancomycin (VANCOCIN) IVPB 1000 mg/200 mL premix  Status:  Discontinued     1,000 mg 200 mL/hr over 60 Minutes Intravenous Every 24 hours 10/04/17 1315 10/05/17 1023   10/05/17 1100  hydroxychloroquine (PLAQUENIL) tablet 400 mg     400 mg Oral Daily 10/05/17 1032     10/04/17 1400  aztreonam (AZACTAM) 1 g in sodium chloride 0.9 % 100 mL IVPB  Status:  Discontinued     1 g 200 mL/hr over 30 Minutes Intravenous Every 8 hours 10/04/17 1126 10/04/17 1243   10/04/17 1315  vancomycin (VANCOCIN) 2,000 mg in sodium chloride 0.9 % 500 mL IVPB     2,000 mg 250 mL/hr over 120 Minutes Intravenous  Once 10/04/17 1307 10/04/17 1637   10/04/17 1315  meropenem (MERREM) 1 g in sodium chloride 0.9 % 100 mL IVPB  Status:  Discontinued     1 g 200 mL/hr over 30 Minutes Intravenous Every 12 hours 10/04/17 1313 10/05/17 1023   10/04/17 1315  azithromycin (ZITHROMAX) 500 mg in sodium chloride 0.9 % 250 mL IVPB     500 mg 250 mL/hr over 60 Minutes Intravenous Every 24 hours 10/04/17 1313 10/07/17 1413   10/04/17 1245  aztreonam (AZACTAM) 1 g in sodium chloride 0.9 % 100 mL IVPB  Status:  Discontinued     1 g 200 mL/hr over 30 Minutes Intravenous Every 8 hours 10/04/17 1243 10/04/17 1313      Objective: Vitals:   10/13/17 0358 10/13/17 0400 10/13/17 0500 10/13/17 0600  BP:   (!) 151/55   Pulse:  80 70 73  Resp:  (!) 24 16 (!) 21  Temp: (!) 97.4 F (36.3 C)      TempSrc: Oral     SpO2:  94% 97% 97%  Weight:      Height:        Intake/Output Summary (Last 24 hours) at 10/13/2017 0734 Last data filed at 10/13/2017 0529 Gross per 24 hour  Intake 810 ml  Output 4725 ml  Net -3915 ml   Filed Weights   10/05/17 0900  Weight: 95 kg (209 lb 7 oz)    Examination:  General exam: Appears calm and comfortable  Psychiatry: Judgement and insight appear normal. Mood & affect appropriate. HEENT: WNLs Respiratory system: Scattered wheezing, mild scattered rhonchi, crackles mild lower lobe Cardiovascular system: Back to normal sinus rhythm,  S1 & S2 heard, RRR. No JVD, murmurs, rubs, gallops or clicks. No pedal edema. Gastrointestinal system: Abd. nondistended, soft and nontender. No organomegaly or masses  felt. Normal bowel sounds heard. Central nervous system: Alert and oriented. No focal neurological deficits. Extremities: Symmetric 5 x 5 power. Skin: No rashes, lesions or ulcers   Data Reviewed: I have personally reviewed following labs and imaging studies  CBC: Recent Labs  Lab 10/07/17 1225  10/09/17 0445 10/10/17 0442 10/11/17 0350 10/12/17 0334 10/13/17 0409  WBC 26.7*   < > 25.9* 18.9* 22.4* 22.7* 23.5*  NEUTROABS 24.1*  --   --  17.1*  --  21.1* 22.3*  HGB 7.3*   < > 9.0* 8.7* 8.8* 9.2* 9.7*  HCT 22.3*   < > 27.3* 26.1* 27.6* 28.9* 30.7*  MCV 96.5   < > 93.8 94.2 95.2 95.4 95.9  PLT 240   < > 228 235 289 339 385   < > = values in this interval not displayed.   Basic Metabolic Panel: Recent Labs  Lab 10/09/17 0445 10/10/17 0442 10/11/17 0350 10/12/17 0334 10/13/17 0409  NA 140 140 143 141 142  K 4.8 4.9 5.1 4.7 4.5  CL 108 107 110 106 103  CO2 24 23 24 26 30   GLUCOSE 153* 139* 131* 125* 110*  BUN 82* 61* 55* 47* 46*  CREATININE 1.26* 1.06* 1.06* 1.02* 1.03*  CALCIUM 7.2* 7.5* 7.9* 8.1* 8.3*  MG  --   --  1.8  --   --    GFCBG: Recent Labs  Lab 10/06/17 1643 10/06/17 1956 10/06/17 2351 10/07/17 0353  10/07/17 0733  GLUCAP 146* 143* 130* 140* 156*    Recent Results (from the past 240 hour(s))  Blood culture (routine x 2)     Status: Abnormal (Preliminary result)   Collection Time: 10/04/17 10:11 AM  Result Value Ref Range Status   Specimen Description BLOOD RIGHT ANTECUBITAL  Final   Special Requests   Final    BOTTLES DRAWN AEROBIC AND ANAEROBIC Blood Culture adequate volume   Culture  Setup Time   Final    GRAM NEGATIVE COCCOBACILLI IN BOTH AEROBIC AND ANAEROBIC BOTTLES CRITICAL VALUE NOTED.  VALUE IS CONSISTENT WITH PREVIOUSLY REPORTED AND CALLED VALUE.    Culture (A)  Final    HAEMOPHILUS INFLUENZAE BETA LACTAMASE NEGATIVE HEALTH DEPARTMENT NOTIFIED Referred to [facility] in [place] for Ser[place]. Performed at [facility], [address], [place], [place] [address]    Report Status PENDING  Incomplete  Blood culture (routine x 2)     Status: Abnormal   Collection Time: 10/04/17 10:31 AM  Result Value Ref Range Status   Specimen Description BLOOD LEFT ANTECUBITAL  Final   Special Requests   Final    BOTTLES DRAWN AEROBIC AND ANAEROBIC Blood Culture adequate volume   Culture  Setup Time   Final    IN BOTH AEROBIC AND ANAEROBIC BOTTLES CORRECTED RESULTS GRAM NEGATIVE COCCOBACILLI PREVIOUSLY REPORTED AS: GRAM POSITIVE COCCI CORRECTED RESULTS CALLED TO: PHARMD E MARTIN [id number] CRITICAL RESULT CALLED TO, READ BACK BY AND VERIFIED WITH: PHARMD E MARTIN [id number] MLM    Culture (A)  Final    HAEMOPHILUS INFLUENZAE BETA LACTAMASE NEGATIVE HEALTH DEPARTMENT NOTIFIED Performed at [facility], [address] [place], [place] [address]    Report Status 10/07/2017 FINAL  Final  Blood Culture ID Panel (Reflexed)     Status: Abnormal   Collection Time: 10/04/17 10:31 AM  Result Value Ref Range Status   Enterococcus species NOT DETECTED NOT DETECTED Final   Listeria monocytogenes NOT DETECTED NOT DETECTED Final  Staphylococcus species NOT DETECTED NOT DETECTED Final   Staphylococcus aureus NOT DETECTED NOT DETECTED Final   Streptococcus species NOT DETECTED NOT DETECTED Final   Streptococcus agalactiae NOT DETECTED NOT DETECTED Final   Streptococcus pneumoniae NOT DETECTED NOT DETECTED Final   Streptococcus pyogenes NOT DETECTED NOT DETECTED Final   Acinetobacter baumannii NOT DETECTED NOT DETECTED Final   Enterobacteriaceae species NOT DETECTED NOT DETECTED Final   Enterobacter cloacae complex NOT DETECTED NOT DETECTED Final   Escherichia coli NOT DETECTED NOT DETECTED Final   Klebsiella oxytoca NOT DETECTED NOT DETECTED Final   Klebsiella pneumoniae NOT DETECTED NOT DETECTED Final   Proteus species NOT DETECTED NOT DETECTED Final   Serratia marcescens NOT DETECTED NOT DETECTED Final   Haemophilus influenzae DETECTED (A) NOT DETECTED Final    Comment: CRITICAL RESULT CALLED TO, READ BACK BY AND VERIFIED WITH: PHARMD E MARTIN [id number] MLM    Neisseria meningitidis NOT DETECTED NOT DETECTED Final   Pseudomonas aeruginosa NOT DETECTED NOT DETECTED Final   Candida albicans NOT DETECTED NOT DETECTED Final   Candida glabrata NOT DETECTED NOT DETECTED Final   Candida krusei NOT DETECTED NOT DETECTED Final   Candida parapsilosis NOT DETECTED NOT DETECTED Final   Candida tropicalis NOT DETECTED NOT DETECTED Final    Comment: Performed at [facility], [address], [place], [place] [address]  Urine culture     Status: None   Collection Time: 10/04/17  5:53 PM  Result Value Ref Range Status   Specimen Description URINE, CATHETERIZED  Final   Special Requests NONE  Final   Culture   Final    NO GROWTH Performed at [facility], [address], [place], [place] [address]    Report Status 10/05/2017 FINAL  Final  MRSA PCR Screening     Status: None   Collection Time: 10/05/17  6:00 AM  Result Value Ref Range Status   MRSA by PCR NEGATIVE NEGATIVE Final    Comment:        The  GeneXpert MRSA Assay (FDA approved for NASAL specimens only), is one component of a comprehensive MRSA colonization surveillance program. It is not intended to diagnose MRSA infection nor to guide or monitor treatment for MRSA infections. Performed at [facility], [address], [place], [place] [address]   Culture, blood (routine x 2)     Status: None   Collection Time: 10/05/17  8:42 PM  Result Value Ref Range Status   Specimen Description BLOOD BLOOD LEFT WRIST  Final   Special Requests   Final    BOTTLES DRAWN AEROBIC ONLY Blood Culture results may not be optimal due to an inadequate volume of blood received in culture bottles   Culture   Final    NO GROWTH 5 DAYS Performed at [facility], [address] [place], [place] [address]    Report Status 10/10/2017 FINAL  Final  Culture, blood (Routine X 2) w Reflex to ID Panel     Status: None   Collection Time: 10/06/17 12:06 PM  Result Value Ref Range Status   Specimen Description BLOOD LEFT HAND  Final   Special Requests   Final    BOTTLES DRAWN AEROBIC ONLY Blood Culture adequate volume   Culture   Final    NO GROWTH 5 DAYS Performed at [facility], [address], [place], [place] [address]    Report Status 10/11/2017 FINAL  Final  Radiology Studies: Dg Chest Port 1 View  Result Date: 10/12/2017 CLINICAL DATA:  Shortness of breath, respiratory failure. EXAM: PORTABLE CHEST 1 VIEW COMPARISON:  Radiograph of Oct 11, 2017. FINDINGS: Stable cardiomediastinal silhouette. Right-sided PICC line is unchanged in position. No pneumothorax is noted. Stable right upper lobe opacity is noted concerning for pneumonia. Stable left basilar opacity is noted concerning for atelectasis or infiltrate. Bony thorax is unremarkable. No significant pleural effusion is noted. IMPRESSION: Stable bilateral lung opacities as described above. Electronically Signed   By: Lupita Raider, M.D.   On: 10/12/2017 09:14     Scheduled Meds: . amiodarone  400 mg Oral BID  . budesonide (PULMICORT) nebulizer solution  0.5 mg Nebulization BID  . chlorhexidine  15 mL Mouth Rinse BID  . Chlorhexidine Gluconate Cloth  6 each Topical Daily  . feeding supplement  1 Container Oral TID BM  . furosemide  40 mg Intravenous Q12H  . guaiFENesin  600 mg Oral BID  . hydroxychloroquine  400 mg Oral Daily  . levothyroxine  100 mcg Oral QAC breakfast  . mouth rinse  15 mL Mouth Rinse q12n4p  . methylPREDNISolone (SOLU-MEDROL) injection  40 mg Intravenous Q24H  . pantoprazole  40 mg Oral BID  . sodium chloride flush  10-40 mL Intracatheter Q12H   Continuous Infusions: . aztreonam Stopped (10/13/17 4734)    Time spent: >45 minutes  Kendell Bane, MD [facility],  Pager [phone]  If 7PM-7AM, please contact night-coverage [email or web address]   Password [facility]  10/13/2017, 7:34 AM

## 2017-10-13 NOTE — Progress Notes (Signed)
At 1945, Dr. Olam Idler via [facility] spoke with patient and suggested ISP.  Patient indicated that she felt she could not do it but would try.  Patient could manage 200-250 but could not tolerate it.  Patient received lasix as ordered, dieresising as charted.  Continues to be anxious, increasing her work of breathing.  Maintaining support and reassurance.  Encouraged patient to purse-lip breath, increased her highflo to 10l.  Monitoring.

## 2017-10-14 ENCOUNTER — Telehealth: Payer: Self-pay | Admitting: Family Medicine

## 2017-10-14 MED ORDER — TRAMADOL HCL 50 MG PO TABS
50.0000 mg | ORAL_TABLET | Freq: Four times a day (QID) | ORAL | Status: DC | PRN
  Administered 2017-10-14 – 2017-10-16 (×5): 50 mg via ORAL
  Filled 2017-10-14 (×6): qty 1

## 2017-10-14 MED ORDER — MAGIC MOUTHWASH W/LIDOCAINE
15.0000 mL | Freq: Three times a day (TID) | ORAL | Status: DC
  Administered 2017-10-14 – 2017-10-15 (×5): 15 mL via ORAL
  Filled 2017-10-14 (×4): qty 15

## 2017-10-14 NOTE — Telephone Encounter (Signed)
Pt's husband calling to check the status on his son FMLA form that was brought in on Wednesday 10-11-17. Husband states he will call back on Monday.  Thanks CC

## 2017-10-14 NOTE — Plan of Care (Signed)
  Problem: Activity: Goal: Risk for activity intolerance will decrease Outcome: Not Progressing   Does not want to get up to chair, desats and SOB with activity MD aware

## 2017-10-14 NOTE — Progress Notes (Signed)
PROGRESS NOTE    Tonya Mcgee  OVF:[id number] DOB: [birth year] DOA: 10/04/2017 PCP: Malva Limes, MD    Brief Narrative:  77 yo female who presented with dyspnea. She does have significant past medical history for hypertension. She developed severe dyspnea, congestion and orthopnea for about 24 hours prior to hospitalization. On initial physical examination she was found in respiratory distress,placed on noninvasive mechanical ventilation. Blood pressure 108/50, heart rate 95, temperature 98.9, respiratory 20, oxygen saturation 92%on 50% FiO2. Patient was lethargic but easy to arouse, heart S1-S2 present and rhythmic, lungs with diffuse rales bilaterally more right than left, abdomen was soft, distended, positive lower extremity edema 2+. Her chest x-ray was positive for a large right upper lobe infiltrate, plus increased interstitial markings bilaterally.   Patient was admitted to the hospital with acute hypoxic respiratory failure due to right upper lobe pneumonia complicated by acute cardiogenic pulmonary edema.    Assessment & Plan:   Active Problems:   Antiphospholipid syndrome (HCC)   Rheumatoid arthritis (HCC)   Community acquired pneumonia   Acute congestive heart failure (HCC)   Pulmonary infiltrate   Acute renal failure (HCC)   SOB (shortness of breath)   Acute respiratory failure with hypoxia (HCC)   Atrial fibrillation with rapid ventricular response (HCC)   Acute upper GI bleed   Acute blood loss anemia   Acute diastolic CHF (congestive heart failure) (HCC)   1. Acute hypoxic respiratory failure due to right upper lobe pneumonia, community-acquired, present on admission/ H influenza. Will continue antibiotic therapy Aztreonam #10/10. Patient has been afebrile and improved dyspnea. Continue albuterol nebulizations, oxymetry monitoring and supplemental 02 per [place]on. Continue budesonide and systemic steroids. Leukocytosis likely reactive to systemic steroids.   2. Acute on  chronic heart failure decompensation. Improved pulmonary edema per chest film, personally reviewed. Continue furosemide to target a negative fluid balance. Currently on furosemide 40 mg IV q12 H, urine output over last 24 hours 3,650 ml.   3. Acute kidney injury. Renal function with serum cr at 1.03 with K at 4.5 with serum bicarbonate at 30. Will continue to follow renal panel in am, avoid hypotension or nephrotoxic medications.   4. Atrial fibrillation with rapid ventricular response. Will continue telemetry monitoring, rate control with amiodarone, continue to hold on anticoagulation for now.    5. Acute blood loss anemia due to upper GI bleed. No further bleeding, will continue proton pump inhibitors, follow on cell count in am. Hb 9.7. Had prbc transfusion in the ICU. No EGD due to acute respiratory failure.   6. Hypothyroidism. Will continue levothyroxine    DVT prophylaxis:   Code Status: full Family Communication: No family at the bedside Disposition Plan: home when stable   Consultants:     Procedures:     Antimicrobials:   Aztreonam.    Subjective: Dyspnea continue to improve, but still not back to baseline, very weak and deconditioned. Has developed tender tongue ulcers, no chest pain, no nausea or vomiting.   Objective: Vitals:   10/14/17 0700 10/14/17 0800 10/14/17 0813 10/14/17 0830  BP:  (!) 148/71    Pulse: 75 78  85  Resp: (!) 21 (!) 25  18  Temp:   97.8 F (36.6 C)   TempSrc:   Oral   SpO2: 92% 95%  99%  Weight:      Height:        Intake/Output Summary (Last 24 hours) at 10/14/2017 1008 Last data filed at 10/14/2017 0800 Gross per 24  hour  Intake 1220 ml  Output 2750 ml  Net -1530 ml   Filed Weights   10/05/17 0900  Weight: 95 kg (209 lb 7 oz)    Examination:   General: deconditioned  Neurology: Awake and alert, non focal  E ENT: mild pallor, no icterus, oral mucosa moist. Tongue with multiple ulcers, clean borders, no blisters.    Cardiovascular: No JVD. S1-S2 present, rhythmic, no gallops, rubs, or murmurs. No lower extremity edema. Pulmonary: decreased breath sounds bilaterally, decreased air movement, no wheezing, rhonchi, but scattered rales at bases.  Gastrointestinal. Abdomen with no organomegaly, non tender, no rebound or guarding Skin. No rashes Musculoskeletal: no joint deformities     Data Reviewed: I have personally reviewed following labs and imaging studies  CBC: Recent Labs  Lab 10/07/17 1225  10/09/17 0445 10/10/17 0442 10/11/17 0350 10/12/17 0334 10/13/17 0409  WBC 26.7*   < > 25.9* 18.9* 22.4* 22.7* 23.5*  NEUTROABS 24.1*  --   --  17.1*  --  21.1* 22.3*  HGB 7.3*   < > 9.0* 8.7* 8.8* 9.2* 9.7*  HCT 22.3*   < > 27.3* 26.1* 27.6* 28.9* 30.7*  MCV 96.5   < > 93.8 94.2 95.2 95.4 95.9  PLT 240   < > 228 235 289 339 385   < > = values in this interval not displayed.   Basic Metabolic Panel: Recent Labs  Lab 10/09/17 0445 10/10/17 0442 10/11/17 0350 10/12/17 0334 10/13/17 0409  NA 140 140 143 141 142  K 4.8 4.9 5.1 4.7 4.5  CL 108 107 110 106 103  CO2 24 23 24 26 30   GLUCOSE 153* 139* 131* 125* 110*  BUN 82* 61* 55* 47* 46*  CREATININE 1.26* 1.06* 1.06* 1.02* 1.03*  CALCIUM 7.2* 7.5* 7.9* 8.1* 8.3*  MG  --   --  1.8  --   --    GFR: Estimated Creatinine Clearance: 48.4 mL/min (A) (by C-G formula based on SCr of 1.03 mg/dL (H)). Liver Function Tests: No results for input(s): AST, ALT, ALKPHOS, BILITOT, PROT, ALBUMIN in the last 168 hours. No results for input(s): LIPASE, AMYLASE in the last 168 hours. No results for input(s): AMMONIA in the last 168 hours. Coagulation Profile: No results for input(s): INR, PROTIME in the last 168 hours. Cardiac Enzymes: No results for input(s): CKTOTAL, CKMB, CKMBINDEX, TROPONINI in the last 168 hours. BNP (last 3 results) No results for input(s): PROBNP in the last 8760 hours. HbA1C: No results for input(s): HGBA1C in the last 72  hours. CBG: No results for input(s): GLUCAP in the last 168 hours. Lipid Profile: No results for input(s): CHOL, HDL, LDLCALC, TRIG, CHOLHDL, LDLDIRECT in the last 72 hours. Thyroid Function Tests: No results for input(s): TSH, T4TOTAL, FREET4, T3FREE, THYROIDAB in the last 72 hours. Anemia Panel: No results for input(s): VITAMINB12, FOLATE, FERRITIN, TIBC, IRON, RETICCTPCT in the last 72 hours.    Radiology Studies: I have reviewed all of the imaging during this hospital visit personally     Scheduled Meds: . amiodarone  400 mg Oral BID  . budesonide (PULMICORT) nebulizer solution  0.5 mg Nebulization BID  . chlorhexidine  15 mL Mouth Rinse BID  . Chlorhexidine Gluconate Cloth  6 each Topical Daily  . feeding supplement  1 Container Oral TID BM  . furosemide  40 mg Intravenous Q12H  . guaiFENesin  600 mg Oral BID  . levothyroxine  100 mcg Oral QAC breakfast  . mouth rinse  15  mL Mouth Rinse q12n4p  . methylPREDNISolone (SOLU-MEDROL) injection  40 mg Intravenous Q24H  . pantoprazole  40 mg Oral BID  . sodium chloride flush  10-40 mL Intracatheter Q12H   Continuous Infusions: . aztreonam Stopped (10/14/17 0653)     LOS: 10 days        Mauricio Annett Gula, MD [facility] Pager [phone]

## 2017-10-14 NOTE — Progress Notes (Signed)
Attempted to call report to [facility], awaiting a return call.

## 2017-10-14 NOTE — Progress Notes (Signed)
Report called to [facility].

## 2017-10-14 NOTE — Progress Notes (Signed)
Pt transferred to [facility] on NRB with son and RN at the bedside. Pt transferred in bed with all belongings and SCD pump and stockings in place. Pt placed on tele once arrived to new room and RN and NT got pt settled. New external catheter given to receiving RN to place at time of arrival.

## 2017-10-14 NOTE — Progress Notes (Signed)
Progress Note  Patient Name: Tonya Mcgee Date of Encounter: 10/14/2017  Primary Cardiologist:   No primary care provider on file.   Subjective   Breathing OK.  No pain.   Inpatient Medications    Scheduled Meds: . amiodarone  400 mg Oral BID  . budesonide (PULMICORT) nebulizer solution  0.5 mg Nebulization BID  . chlorhexidine  15 mL Mouth Rinse BID  . Chlorhexidine Gluconate Cloth  6 each Topical Daily  . feeding supplement  1 Container Oral TID BM  . furosemide  40 mg Intravenous Q12H  . guaiFENesin  600 mg Oral BID  . levothyroxine  100 mcg Oral QAC breakfast  . mouth rinse  15 mL Mouth Rinse q12n4p  . methylPREDNISolone (SOLU-MEDROL) injection  40 mg Intravenous Q24H  . pantoprazole  40 mg Oral BID  . sodium chloride flush  10-40 mL Intracatheter Q12H   Continuous Infusions: . aztreonam Stopped (10/14/17 0653)   PRN Meds: acetaminophen, albuterol, ALPRAZolam, magic mouthwash w/lidocaine, ondansetron, polyethylene glycol, senna-docusate, sodium chloride flush, sorbitol, traMADol   Vital Signs    Vitals:   10/14/17 0200 10/14/17 0300 10/14/17 0400 10/14/17 0500  BP:   (!) 135/52   Pulse: 73 69 71 69  Resp: (!) 23 18 19 18   Temp:   97.6 F (36.4 C)   TempSrc:   Oral   SpO2: (!) 87% 92% 93% 99%  Weight:      Height:        Intake/Output Summary (Last 24 hours) at 10/14/2017 0739 Last data filed at 10/14/2017 0518 Gross per 24 hour  Intake 1070 ml  Output 3650 ml  Net -2580 ml   Filed Weights   10/05/17 0900  Weight: 209 lb 7 oz (95 kg)    Telemetry    NSR - Personally Reviewed  ECG    NA - Personally Reviewed  Physical Exam   GEN: No acute distress.   Neck: No  JVD Cardiac: RRR, no murmurs, rubs, or gallops.  Respiratory: Decreased breath sounds with scattered coarse rhonchi. GI: Soft, nontender, non-distended  MS: No  edema; No deformity. Neuro:  Nonfocal  Psych: Normal affect   Labs    Chemistry Recent Labs  Lab 10/11/17 0350  10/12/17 0334 10/13/17 0409  NA 143 141 142  K 5.1 4.7 4.5  CL 110 106 103  CO2 24 26 30   GLUCOSE 131* 125* 110*  BUN 55* 47* 46*  CREATININE 1.06* 1.02* 1.03*  CALCIUM 7.9* 8.1* 8.3*  GFRNONAA 49* 51* 51*  GFRAA 57* 59* 59*  ANIONGAP 9 9 9      Hematology Recent Labs  Lab 10/11/17 0350 10/12/17 0334 10/13/17 0409  WBC 22.4* 22.7* 23.5*  RBC 2.90* 3.03* 3.20*  HGB 8.8* 9.2* 9.7*  HCT 27.6* 28.9* 30.7*  MCV 95.2 95.4 95.9  MCH 30.3 30.4 30.3  MCHC 31.9 31.8 31.6  RDW 15.6* 15.3 15.3  PLT 289 339 385    Cardiac EnzymesNo results for input(s): TROPONINI in the last 168 hours. No results for input(s): TROPIPOC in the last 168 hours.   BNPNo results for input(s): BNP, PROBNP in the last 168 hours.   DDimer No results for input(s): DDIMER in the last 168 hours.   Radiology    No results found.  Cardiac Studies    2D Echo: Study Conclusions  - Left ventricle: The cavity size was normal. Systolic function was vigorous. The estimated ejection fraction was in the range of 65% to 70%. Wall motion was  normal; there were no regional wall motion abnormalities. Doppler parameters are consistent with abnormal left ventricular relaxation (grade 1 diastolic dysfunction). Doppler parameters are consistent with high ventricular filling pressure. - Aortic valve: Valve area (VTI): 2.57 cm^2. Valve area (Vmax): 2.64 cm^2. Valve area (Vmean): 2.35 cm^2. - Mitral valve: Calcified annulus. - Left atrium: The atrium was mildly dilated.  Impressions:  - Vigorous LV systolic function; mild diastolic dysfunction with elevated LV filling pressure; mildly elevated LVOT velocity of 2.3 m/s likely related to vigorous LV function; mild LAE; trace MR and TR.     Patient Profile     77 y.o. female with acute respiratory failure who developed AF with RVR, now converted to sinus rhythm on IV amiodarone.  Assessment & Plan    ATRIAL FIB:  NSR, continue  current dose of amiodarone.    ACUTE ON CHRONIC DIASTOLIC HF:  Negative 2.5 liters last 24 hours.  Continue diuresis with current meds.    For questions or updates, please contact [facility] Please consult [email or web address] for contact info under Cardiology/STEMI.   Signed, Rollene Rotunda, MD  10/14/2017, 7:39 AM

## 2017-10-14 NOTE — Plan of Care (Signed)
Patient does not want to get up to chair, desats with activity, Dr. Ella Jubilee aware

## 2017-10-15 LAB — CBC WITH DIFFERENTIAL/PLATELET
BASOS PCT: 0 %
Basophils Absolute: 0 10*3/uL (ref 0.0–0.1)
EOS PCT: 0 %
Eosinophils Absolute: 0 10*3/uL (ref 0.0–0.7)
HCT: 32.8 % — ABNORMAL LOW (ref 36.0–46.0)
Hemoglobin: 10.6 g/dL — ABNORMAL LOW (ref 12.0–15.0)
LYMPHS PCT: 6 %
Lymphs Abs: 1.3 10*3/uL (ref 0.7–4.0)
MCH: 31.1 pg (ref 26.0–34.0)
MCHC: 32.3 g/dL (ref 30.0–36.0)
MCV: 96.2 fL (ref 78.0–100.0)
MONO ABS: 0.3 10*3/uL (ref 0.1–1.0)
Monocytes Relative: 1 %
NEUTROS ABS: 19.7 10*3/uL — AB (ref 1.7–7.7)
Neutrophils Relative %: 93 %
PLATELETS: 415 10*3/uL — AB (ref 150–400)
RBC: 3.41 MIL/uL — AB (ref 3.87–5.11)
RDW: 15.1 % (ref 11.5–15.5)
WBC: 21.3 10*3/uL — AB (ref 4.0–10.5)

## 2017-10-15 LAB — BASIC METABOLIC PANEL
Anion gap: 10 (ref 5–15)
BUN: 39 mg/dL — ABNORMAL HIGH (ref 6–20)
CALCIUM: 8.4 mg/dL — AB (ref 8.9–10.3)
CO2: 35 mmol/L — ABNORMAL HIGH (ref 22–32)
Chloride: 92 mmol/L — ABNORMAL LOW (ref 101–111)
Creatinine, Ser: 0.99 mg/dL (ref 0.44–1.00)
GFR, EST NON AFRICAN AMERICAN: 53 mL/min — AB (ref >60)
Glucose, Bld: 98 mg/dL (ref 65–99)
POTASSIUM: 3.9 mmol/L (ref 3.5–5.1)
Sodium: 137 mmol/L (ref 135–145)

## 2017-10-15 MED ORDER — VALACYCLOVIR HCL 500 MG PO TABS
1000.0000 mg | ORAL_TABLET | Freq: Three times a day (TID) | ORAL | Status: DC
  Administered 2017-10-15 – 2017-10-16 (×4): 1000 mg via ORAL
  Filled 2017-10-15 (×4): qty 2

## 2017-10-15 MED ORDER — HYDROCODONE-ACETAMINOPHEN 5-325 MG PO TABS
1.0000 | ORAL_TABLET | Freq: Four times a day (QID) | ORAL | Status: DC | PRN
  Administered 2017-10-15: 2 via ORAL
  Filled 2017-10-15: qty 2

## 2017-10-15 MED ORDER — MAGIC MOUTHWASH W/LIDOCAINE
15.0000 mL | Freq: Four times a day (QID) | ORAL | Status: DC
  Administered 2017-10-16 (×2): 15 mL via ORAL
  Filled 2017-10-15 (×2): qty 15

## 2017-10-15 NOTE — Progress Notes (Signed)
PROGRESS NOTE    Tonya Mcgee  ZOX:[id number] DOB: [birth year] DOA: 10/04/2017 PCP: Malva Limes, MD    Brief Narrative:  77 yo female who presented with dyspnea. She does have significant past medical history for hypertension. She developed severe dyspnea, congestion and orthopnea for about 24 hours prior to hospitalization. On initial physical examination she was found in respiratory distress,placed on noninvasive mechanical ventilation. Blood pressure 108/50, heart rate 95, temperature 98.9, respiratory 20, oxygen saturation 92%on 50% FiO2. Patient was lethargic but easy to arouse, heart S1-S2 present and rhythmic, lungs with diffuse rales bilaterally more right than left, abdomen was soft, distended, positive lower extremity edema 2+. Her chest x-ray was positive for a large right upper lobe infiltrate, plus increased interstitial markings bilaterally.   Patient was admitted to the hospital with acute hypoxic respiratory failure due to right upper lobe pneumonia complicated by acute cardiogenic pulmonary edema.   Assessment & Plan:   Active Problems:   Antiphospholipid syndrome (HCC)   Rheumatoid arthritis (HCC)   Community acquired pneumonia   Acute congestive heart failure (HCC)   Pulmonary infiltrate   Acute renal failure (HCC)   SOB (shortness of breath)   Acute respiratory failure with hypoxia (HCC)   Atrial fibrillation with rapid ventricular response (HCC)   Acute upper GI bleed   Acute blood loss anemia   Acute diastolic CHF (congestive heart failure) (HCC)   1. Acute hypoxic respiratory failure due to right upper lobe pneumonia, community-acquired, present on admission/ H influenza. Completed antibiotic therapy Aztreonam #10/10. On albuterol nebulizations, oxymetry monitoring and supplemental 02 per [place]. Will discontinue systemic steroids but continue budesonide. Wbc trending down to 21 from 23, suspected reactive.   2. Acute on chronic heart failure decompensation.  On IV furosemide 40 mg q12 hours,  to target, urine output over last 24 hours 1,775 ml. WIll continue to follow on renal panel in am. Echocardiogram with preserved LV systolic function. Continue diuresis with rate control for atrial fibrillation. Systolic blood pressure 125 mmHg.   3. Acute kidney injury. It has resolved, will continue to follow on renal function while using IV diuresis, K at 3,9 and serum bicarbonate at 35.   4. Atrial fibrillation with rapid ventricular response. Telemetry on sinus rhythm, will continue amiodarone, no anticoagulation due to GI bleed.    5. Acute blood loss anemia due to upper GI bleed. On proton pump inhibitors, follow on cell count in am. Hb 10.6 Had prbc 2 units transfusion, no further signs of bleeding. No endoscopic procedure due to acute illness and respiratory failure.  6. Hypothyroidism. On levothyroxine   7. New and worsening tongue ulcers and blisters. Refractive and worsening, will do a trial of valacyclovir. Close monitoring.    DVT prophylaxis:   Code Status: full Family Communication: No family at the bedside Disposition Plan: Transfer to telemetry, will need SNF at discharge.    Consultants:     Procedures:     Antimicrobials:   Aztreonam. completed 10 days.     Subjective: Patient with persistent moth pain, multiple painful tongue ulcers, initially blisters, no nausea or vomiting, no chest pain. Dyspnea continue to improve, very weak and deconditioned.    Objective: Vitals:   10/15/17 0330 10/15/17 0419 10/15/17 0751 10/15/17 0754  BP:  (!) 143/64  120/61  Pulse: 66 76  75  Resp: 15 (!) 22  (!) 25  Temp:  (!) 97.3 F (36.3 C)  97.6 F (36.4 C)  TempSrc:  Oral  Oral  SpO2: 99% 94% 98% 98%  Weight:      Height:        Intake/Output Summary (Last 24 hours) at 10/15/2017 1254 Last data filed at 10/15/2017 2563 Gross per 24 hour  Intake 1040 ml  Output 775 ml  Net 265 ml   Filed Weights   10/05/17 0900    Weight: 95 kg (209 lb 7 oz)    Examination:   General: deconditioned and ill looking appearing Neurology: Awake and alert, non focal  E ENT: mild pallor, no icterus, oral mucosa moist Cardiovascular: No JVD. S1-S2 present, rhythmic, no gallops, rubs, or murmurs. Trace lower extremity edema. Pulmonary: decreased breath sounds bilaterally at bases, no wheezing, rhonchi, scattered rales. Gastrointestinal. Abdomen with no organomegaly, non tender, no rebound or guarding Skin. No rashes Musculoskeletal: no joint deformities     Data Reviewed: I have personally reviewed following labs and imaging studies  CBC: Recent Labs  Lab 10/10/17 0442 10/11/17 0350 10/12/17 0334 10/13/17 0409 10/15/17 0219  WBC 18.9* 22.4* 22.7* 23.5* 21.3*  NEUTROABS 17.1*  --  21.1* 22.3* 19.7*  HGB 8.7* 8.8* 9.2* 9.7* 10.6*  HCT 26.1* 27.6* 28.9* 30.7* 32.8*  MCV 94.2 95.2 95.4 95.9 96.2  PLT 235 289 339 385 415*   Basic Metabolic Panel: Recent Labs  Lab 10/10/17 0442 10/11/17 0350 10/12/17 0334 10/13/17 0409 10/15/17 0219  NA 140 143 141 142 137  K 4.9 5.1 4.7 4.5 3.9  CL 107 110 106 103 92*  CO2 23 24 26 30  35*  GLUCOSE 139* 131* 125* 110* 98  BUN 61* 55* 47* 46* 39*  CREATININE 1.06* 1.06* 1.02* 1.03* 0.99  CALCIUM 7.5* 7.9* 8.1* 8.3* 8.4*  MG  --  1.8  --   --   --    GFR: Estimated Creatinine Clearance: 50.3 mL/min (by C-G formula based on SCr of 0.99 mg/dL). Liver Function Tests: No results for input(s): AST, ALT, ALKPHOS, BILITOT, PROT, ALBUMIN in the last 168 hours. No results for input(s): LIPASE, AMYLASE in the last 168 hours. No results for input(s): AMMONIA in the last 168 hours. Coagulation Profile: No results for input(s): INR, PROTIME in the last 168 hours. Cardiac Enzymes: No results for input(s): CKTOTAL, CKMB, CKMBINDEX, TROPONINI in the last 168 hours. BNP (last 3 results) No results for input(s): PROBNP in the last 8760 hours. HbA1C: No results for input(s):  HGBA1C in the last 72 hours. CBG: No results for input(s): GLUCAP in the last 168 hours. Lipid Profile: No results for input(s): CHOL, HDL, LDLCALC, TRIG, CHOLHDL, LDLDIRECT in the last 72 hours. Thyroid Function Tests: No results for input(s): TSH, T4TOTAL, FREET4, T3FREE, THYROIDAB in the last 72 hours. Anemia Panel: No results for input(s): VITAMINB12, FOLATE, FERRITIN, TIBC, IRON, RETICCTPCT in the last 72 hours.    Radiology Studies: I have reviewed all of the imaging during this hospital visit personally     Scheduled Meds: . amiodarone  400 mg Oral BID  . budesonide (PULMICORT) nebulizer solution  0.5 mg Nebulization BID  . chlorhexidine  15 mL Mouth Rinse BID  . Chlorhexidine Gluconate Cloth  6 each Topical Daily  . feeding supplement  1 Container Oral TID BM  . furosemide  40 mg Intravenous Q12H  . guaiFENesin  600 mg Oral BID  . levothyroxine  100 mcg Oral QAC breakfast  . magic mouthwash w/lidocaine  15 mL Oral TID  . mouth rinse  15 mL Mouth Rinse q12n4p  . methylPREDNISolone (SOLU-MEDROL) injection  40  mg Intravenous Q24H  . pantoprazole  40 mg Oral BID  . sodium chloride flush  10-40 mL Intracatheter Q12H   Continuous Infusions: . aztreonam 1 g (10/15/17 0523)     LOS: 11 days        Signe Tackitt Annett Gula, MD [facility] Pager [phone]

## 2017-10-15 NOTE — Progress Notes (Signed)
Progress Note  Patient Name: Tonya Mcgee Date of Encounter: 10/15/2017  Primary Cardiologist:   No primary care provider on file.   Subjective   She is very weak but no acute pain or SOB.   Inpatient Medications    Scheduled Meds: . amiodarone  400 mg Oral BID  . budesonide (PULMICORT) nebulizer solution  0.5 mg Nebulization BID  . chlorhexidine  15 mL Mouth Rinse BID  . Chlorhexidine Gluconate Cloth  6 each Topical Daily  . feeding supplement  1 Container Oral TID BM  . furosemide  40 mg Intravenous Q12H  . guaiFENesin  600 mg Oral BID  . levothyroxine  100 mcg Oral QAC breakfast  . magic mouthwash w/lidocaine  15 mL Oral TID  . mouth rinse  15 mL Mouth Rinse q12n4p  . methylPREDNISolone (SOLU-MEDROL) injection  40 mg Intravenous Q24H  . pantoprazole  40 mg Oral BID  . sodium chloride flush  10-40 mL Intracatheter Q12H   Continuous Infusions: . aztreonam 1 g (10/15/17 0523)   PRN Meds: acetaminophen, albuterol, ALPRAZolam, ondansetron, polyethylene glycol, senna-docusate, sodium chloride flush, sorbitol, traMADol   Vital Signs    Vitals:   10/15/17 0330 10/15/17 0419 10/15/17 0751 10/15/17 0754  BP:  (!) 143/64  120/61  Pulse: 66 76  75  Resp: 15 (!) 22  (!) 25  Temp:  (!) 97.3 F (36.3 C)  97.6 F (36.4 C)  TempSrc:  Oral  Oral  SpO2: 99% 94% 98% 98%  Weight:      Height:        Intake/Output Summary (Last 24 hours) at 10/15/2017 0900 Last data filed at 10/15/2017 0432 Gross per 24 hour  Intake 910 ml  Output 1275 ml  Net -365 ml   Filed Weights   10/05/17 0900  Weight: 209 lb 7 oz (95 kg)    Telemetry    NSR, run of atrial tachycardia - Personally Reviewed  ECG    NA - Personally Reviewed  Physical Exam   GEN: No  acute distress.   Neck: No  JVD Cardiac: RRR, no murmurs, rubs, or gallops.  Respiratory:    Decreased breath sounds with bilateral right greater than left crackles GI: Soft, nontender, non-distended, normal bowel sounds  MS:   Mild edema; No deformity. Neuro:   Nonfocal  Psych: Oriented and appropriate   Labs    Chemistry Recent Labs  Lab 10/12/17 0334 10/13/17 0409 10/15/17 0219  NA 141 142 137  K 4.7 4.5 3.9  CL 106 103 92*  CO2 26 30 35*  GLUCOSE 125* 110* 98  BUN 47* 46* 39*  CREATININE 1.02* 1.03* 0.99  CALCIUM 8.1* 8.3* 8.4*  GFRNONAA 51* 51* 53*  GFRAA 59* 59* >60  ANIONGAP 9 9 10      Hematology Recent Labs  Lab 10/12/17 0334 10/13/17 0409 10/15/17 0219  WBC 22.7* 23.5* 21.3*  RBC 3.03* 3.20* 3.41*  HGB 9.2* 9.7* 10.6*  HCT 28.9* 30.7* 32.8*  MCV 95.4 95.9 96.2  MCH 30.4 30.3 31.1  MCHC 31.8 31.6 32.3  RDW 15.3 15.3 15.1  PLT 339 385 415*    Cardiac EnzymesNo results for input(s): TROPONINI in the last 168 hours. No results for input(s): TROPIPOC in the last 168 hours.   BNPNo results for input(s): BNP, PROBNP in the last 168 hours.   DDimer No results for input(s): DDIMER in the last 168 hours.   Radiology    No results found.  Cardiac Studies  2D Echo: Study Conclusions  - Left ventricle: The cavity size was normal. Systolic function was vigorous. The estimated ejection fraction was in the range of 65% to 70%. Wall motion was normal; there were no regional wall motion abnormalities. Doppler parameters are consistent with abnormal left ventricular relaxation (grade 1 diastolic dysfunction). Doppler parameters are consistent with high ventricular filling pressure. - Aortic valve: Valve area (VTI): 2.57 cm^2. Valve area (Vmax): 2.64 cm^2. Valve area (Vmean): 2.35 cm^2. - Mitral valve: Calcified annulus. - Left atrium: The atrium was mildly dilated.  Impressions:  - Vigorous LV systolic function; mild diastolic dysfunction with elevated LV filling pressure; mildly elevated LVOT velocity of 2.3 m/s likely related to vigorous LV function; mild LAE; trace MR and TR.    Patient Profile     77 y.o. female with acute respiratory  failure who developed AF with RVR, converted to sinus rhythm on IV amiodarone.  Assessment & Plan    ATRIAL FIB:     NSR, continue current dose of amiodarone.    ACUTE ON CHRONIC DIASTOLIC HF:  No weights recorded.   I/O slightly negative.  Need to have daily weights and I/Os.  Also encouraged good pulmonary toilet.   For questions or updates, please contact [facility] Please consult [email or web address] for contact info under Cardiology/STEMI.   Signed, Rollene Rotunda, MD  10/15/2017, 9:00 AM

## 2017-10-15 NOTE — Evaluation (Signed)
Physical Therapy Evaluation Patient Details Name: Tonya Mcgee MRN: [id number] DOB: 12/21/1940 Today's Date: 10/15/2017   History of Present Illness  77 y.o. female admitted with acute hypoxic respiratory failure. Surgical history that includes Carpal tunnel release; Knee surgery; Appendectomy; Tonsillectomy; Carotid doppler ultrasound; pap smear; mammogram screening; Bone Density Study; and Colonoscopy Screening.with  History of chicken pox, History of measles, and History of mumps.  Clinical Impression  Pt admitted with above diagnosis. Pt currently with functional limitations due to the deficits listed below (see PT Problem List). SpO2 on 6L avg between 88-92% with activity. HR in 80s throughout session. Max assist for scoot transfer to chair, but able to assist with scoot using UEs. LEs are profoundly weak and she was not capable of standing. PTA pt was ambulatory with a rollator but required assistance with all ADLs from her husband. Pt will benefit from skilled PT to increase their independence and safety with mobility to allow discharge to the venue listed below.       Follow Up Recommendations SNF    Equipment Recommendations  None recommended by PT    Recommendations for Other Services       Precautions / Restrictions Precautions Precautions: Fall Precaution Comments: monitor O2 Restrictions Weight Bearing Restrictions: No      Mobility  Bed Mobility Overal bed mobility: Needs Assistance Bed Mobility: Supine to Sit     Supine to sit: Mod assist;HOB elevated     General bed mobility comments: Mod assist for LE support and to assist with scoot to EOB. Pt able to assist using upper body to push and pull when cued. Slow and weak with LE movements.  Transfers Overall transfer level: Needs assistance Equipment used: None Transfers: Lateral/Scoot Transfers          Lateral/Scoot Transfers: Max assist;From elevated surface General transfer comment: Attempted  sit<>stand x2 however pt too weak to rise even from elevated surfance. Practiced lateral scoots on side of bed and into chair from slightly elevated surface. Cues for forward lean and buttock lift which was minimal at best. Max assist for scoot using bed pad.  Ambulation/Gait                Stairs            Wheelchair Mobility    Modified Rankin (Stroke Patients Only)       Balance Overall balance assessment: Needs assistance Sitting-balance support: No upper extremity supported Sitting balance-Leahy Scale: Fair                                       Pertinent Vitals/Pain Pain Assessment: Faces Faces Pain Scale: Hurts a little bit Pain Location: back Pain Descriptors / Indicators: Constant Pain Intervention(s): Monitored during session;Repositioned    Home Living Family/patient expects to be discharged to:: Private residence Living Arrangements: Spouse/significant other Available Help at Discharge: Family;Available 24 hours/day Type of Home: House Home Access: Level entry     Home Layout: One level Home Equipment: Walker - 4 wheels;Shower seat;Wheelchair - power      Prior Function Level of Independence: Needs assistance   Gait / Transfers Assistance Needed: Uses rollator most places however when she has to walk long distances, such as when she goes to the store, she will use a wheelchair.  ADL's / Homemaking Assistance Needed: Husband assists with bath/dressing.        Hand Dominance  Dominant Hand: Right    Extremity/Trunk Assessment   Upper Extremity Assessment Upper Extremity Assessment: Defer to OT evaluation    Lower Extremity Assessment Lower Extremity Assessment: Generalized weakness       Communication   Communication: No difficulties  Cognition Arousal/Alertness: Awake/alert Behavior During Therapy: WFL for tasks assessed/performed Overall Cognitive Status: Within Functional Limits for tasks assessed                                         General Comments General comments (skin integrity, edema, etc.): Profound weakness of lower extremities.    Exercises General Exercises - Lower Extremity Ankle Circles/Pumps: AROM;Both;20 reps;Seated Quad Sets: Strengthening;Both;10 reps;Supine Gluteal Sets: Strengthening;Both;10 reps;Seated Long Arc Quad: AAROM;Both;10 reps;Seated   Assessment/Plan    PT Assessment Patient needs continued PT services  PT Problem List Decreased strength;Decreased activity tolerance;Decreased balance;Decreased mobility;Decreased knowledge of use of DME;Cardiopulmonary status limiting activity;Pain       PT Treatment Interventions DME instruction;Gait training;Functional mobility training;Therapeutic activities;Therapeutic exercise;Balance training;Neuromuscular re-education;Patient/family education    PT Goals (Current goals can be found in the Care Plan section)  Acute Rehab PT Goals Patient Stated Goal: Get well so she can go home PT Goal Formulation: With patient Time For Goal Achievement: 10/29/17 Potential to Achieve Goals: Good    Frequency Min 3X/week   Barriers to discharge Decreased caregiver support Husband cannot provide max physical assistance    Co-evaluation               AM-PAC PT "6 Clicks" Daily Activity  Outcome Measure Difficulty turning over in bed (including adjusting bedclothes, sheets and blankets)?: A Lot Difficulty moving from lying on back to sitting on the side of the bed? : A Lot Difficulty sitting down on and standing up from a chair with arms (e.g., wheelchair, bedside commode, etc,.)?: Unable Help needed moving to and from a bed to chair (including a wheelchair)?: A Lot Help needed walking in hospital room?: Total Help needed climbing 3-5 steps with a railing? : Total 6 Click Score: 9    End of Session Equipment Utilized During Treatment: Gait belt;Oxygen Activity Tolerance: Patient tolerated treatment  well Patient left: in chair;with call bell/phone within reach;with family/visitor present;with SCD's reapplied;with nursing/sitter in room Nurse Communication: Mobility status PT Visit Diagnosis: Muscle weakness (generalized) (M62.81);Difficulty in walking, not elsewhere classified (R26.2)    Time: 3016-0109 PT Time Calculation (min) (ACUTE ONLY): 50 min   Charges:   PT Evaluation $PT Eval High Complexity: 1 High PT Treatments $Therapeutic Exercise: 8-22 mins $Therapeutic Activity: 8-22 mins   PT G Codes:        Charlsie Merles, PT, DPT  [place] 10/15/2017, 10:27 AM

## 2017-10-15 NOTE — Plan of Care (Signed)
Physical Therapy goals established for discharge recommendations to SNF for further rehab.

## 2017-10-16 ENCOUNTER — Inpatient Hospital Stay
Admission: AD | Admit: 2017-10-16 | Discharge: 2017-11-11 | Disposition: E | Payer: Medicare Other | Source: Ambulatory Visit | Attending: Internal Medicine | Admitting: Internal Medicine

## 2017-10-16 DIAGNOSIS — I509 Heart failure, unspecified: Secondary | ICD-10-CM | POA: Diagnosis not present

## 2017-10-16 DIAGNOSIS — Z66 Do not resuscitate: Secondary | ICD-10-CM | POA: Diagnosis not present

## 2017-10-16 DIAGNOSIS — I5033 Acute on chronic diastolic (congestive) heart failure: Secondary | ICD-10-CM | POA: Diagnosis not present

## 2017-10-16 DIAGNOSIS — R1310 Dysphagia, unspecified: Secondary | ICD-10-CM

## 2017-10-16 DIAGNOSIS — G894 Chronic pain syndrome: Secondary | ICD-10-CM | POA: Diagnosis not present

## 2017-10-16 DIAGNOSIS — J189 Pneumonia, unspecified organism: Secondary | ICD-10-CM

## 2017-10-16 DIAGNOSIS — F419 Anxiety disorder, unspecified: Secondary | ICD-10-CM | POA: Diagnosis present

## 2017-10-16 DIAGNOSIS — Z978 Presence of other specified devices: Secondary | ICD-10-CM

## 2017-10-16 DIAGNOSIS — J9601 Acute respiratory failure with hypoxia: Secondary | ICD-10-CM | POA: Diagnosis not present

## 2017-10-16 DIAGNOSIS — K148 Other diseases of tongue: Secondary | ICD-10-CM | POA: Diagnosis not present

## 2017-10-16 DIAGNOSIS — N179 Acute kidney failure, unspecified: Secondary | ICD-10-CM | POA: Diagnosis present

## 2017-10-16 DIAGNOSIS — J181 Lobar pneumonia, unspecified organism: Secondary | ICD-10-CM | POA: Diagnosis not present

## 2017-10-16 DIAGNOSIS — J9621 Acute and chronic respiratory failure with hypoxia: Secondary | ICD-10-CM | POA: Diagnosis not present

## 2017-10-16 DIAGNOSIS — I5031 Acute diastolic (congestive) heart failure: Secondary | ICD-10-CM | POA: Diagnosis not present

## 2017-10-16 DIAGNOSIS — Z4682 Encounter for fitting and adjustment of non-vascular catheter: Secondary | ICD-10-CM | POA: Diagnosis not present

## 2017-10-16 DIAGNOSIS — J9620 Acute and chronic respiratory failure, unspecified whether with hypoxia or hypercapnia: Secondary | ICD-10-CM | POA: Diagnosis present

## 2017-10-16 DIAGNOSIS — R0602 Shortness of breath: Secondary | ICD-10-CM | POA: Diagnosis not present

## 2017-10-16 DIAGNOSIS — J90 Pleural effusion, not elsewhere classified: Secondary | ICD-10-CM

## 2017-10-16 DIAGNOSIS — I469 Cardiac arrest, cause unspecified: Secondary | ICD-10-CM | POA: Diagnosis not present

## 2017-10-16 DIAGNOSIS — D62 Acute posthemorrhagic anemia: Secondary | ICD-10-CM | POA: Diagnosis present

## 2017-10-16 DIAGNOSIS — Z789 Other specified health status: Secondary | ICD-10-CM

## 2017-10-16 DIAGNOSIS — J9690 Respiratory failure, unspecified, unspecified whether with hypoxia or hypercapnia: Secondary | ICD-10-CM

## 2017-10-16 DIAGNOSIS — R112 Nausea with vomiting, unspecified: Secondary | ICD-10-CM | POA: Diagnosis not present

## 2017-10-16 DIAGNOSIS — M059 Rheumatoid arthritis with rheumatoid factor, unspecified: Secondary | ICD-10-CM | POA: Diagnosis not present

## 2017-10-16 DIAGNOSIS — R0902 Hypoxemia: Secondary | ICD-10-CM

## 2017-10-16 DIAGNOSIS — K922 Gastrointestinal hemorrhage, unspecified: Secondary | ICD-10-CM | POA: Diagnosis not present

## 2017-10-16 DIAGNOSIS — M069 Rheumatoid arthritis, unspecified: Secondary | ICD-10-CM | POA: Diagnosis present

## 2017-10-16 DIAGNOSIS — R918 Other nonspecific abnormal finding of lung field: Secondary | ICD-10-CM

## 2017-10-16 DIAGNOSIS — I482 Chronic atrial fibrillation, unspecified: Secondary | ICD-10-CM

## 2017-10-16 DIAGNOSIS — Z87891 Personal history of nicotine dependence: Secondary | ICD-10-CM | POA: Diagnosis not present

## 2017-10-16 DIAGNOSIS — E871 Hypo-osmolality and hyponatremia: Secondary | ICD-10-CM | POA: Diagnosis not present

## 2017-10-16 DIAGNOSIS — E876 Hypokalemia: Secondary | ICD-10-CM | POA: Diagnosis not present

## 2017-10-16 DIAGNOSIS — Z9911 Dependence on respirator [ventilator] status: Secondary | ICD-10-CM | POA: Diagnosis not present

## 2017-10-16 DIAGNOSIS — I4891 Unspecified atrial fibrillation: Secondary | ICD-10-CM | POA: Diagnosis not present

## 2017-10-16 DIAGNOSIS — E039 Hypothyroidism, unspecified: Secondary | ICD-10-CM | POA: Diagnosis present

## 2017-10-16 DIAGNOSIS — E46 Unspecified protein-calorie malnutrition: Secondary | ICD-10-CM | POA: Diagnosis not present

## 2017-10-16 HISTORY — DX: Pneumonia, unspecified organism: J18.9

## 2017-10-16 HISTORY — DX: Chronic atrial fibrillation, unspecified: I48.20

## 2017-10-16 HISTORY — DX: Pleural effusion, not elsewhere classified: J90

## 2017-10-16 HISTORY — DX: Chronic pain syndrome: G89.4

## 2017-10-16 LAB — CBC WITH DIFFERENTIAL/PLATELET
BASOS ABS: 0 10*3/uL (ref 0.0–0.1)
Basophils Relative: 0 %
Eosinophils Absolute: 0.1 10*3/uL (ref 0.0–0.7)
Eosinophils Relative: 1 %
HEMATOCRIT: 34.2 % — AB (ref 36.0–46.0)
HEMOGLOBIN: 11.1 g/dL — AB (ref 12.0–15.0)
LYMPHS ABS: 2.5 10*3/uL (ref 0.7–4.0)
Lymphocytes Relative: 14 %
MCH: 31.4 pg (ref 26.0–34.0)
MCHC: 32.5 g/dL (ref 30.0–36.0)
MCV: 96.9 fL (ref 78.0–100.0)
Monocytes Absolute: 0.7 10*3/uL (ref 0.1–1.0)
Monocytes Relative: 4 %
NEUTROS PCT: 81 %
Neutro Abs: 14.5 10*3/uL — ABNORMAL HIGH (ref 1.7–7.7)
Platelets: 421 10*3/uL — ABNORMAL HIGH (ref 150–400)
RBC: 3.53 MIL/uL — AB (ref 3.87–5.11)
RDW: 15.5 % (ref 11.5–15.5)
WBC: 17.8 10*3/uL — ABNORMAL HIGH (ref 4.0–10.5)

## 2017-10-16 LAB — BASIC METABOLIC PANEL
ANION GAP: 12 (ref 5–15)
BUN: 44 mg/dL — AB (ref 6–20)
CHLORIDE: 92 mmol/L — AB (ref 101–111)
CO2: 34 mmol/L — AB (ref 22–32)
Calcium: 8.2 mg/dL — ABNORMAL LOW (ref 8.9–10.3)
Creatinine, Ser: 1.13 mg/dL — ABNORMAL HIGH (ref 0.44–1.00)
GFR calc Af Amer: 53 mL/min — ABNORMAL LOW (ref >60)
GFR, EST NON AFRICAN AMERICAN: 45 mL/min — AB (ref >60)
GLUCOSE: 89 mg/dL (ref 65–99)
POTASSIUM: 3.8 mmol/L (ref 3.5–5.1)
Sodium: 138 mmol/L (ref 135–145)

## 2017-10-16 LAB — BLOOD GAS, ARTERIAL
ACID-BASE EXCESS: 9.5 mmol/L — AB (ref 0.0–2.0)
BICARBONATE: 33.3 mmol/L — AB (ref 20.0–28.0)
O2 CONTENT: 10 L/min
O2 SAT: 87.3 %
PCO2 ART: 42.5 mmHg (ref 32.0–48.0)
PH ART: 7.505 — AB (ref 7.350–7.450)
Patient temperature: 98.6
pO2, Arterial: 52.8 mmHg — ABNORMAL LOW (ref 83.0–108.0)

## 2017-10-16 MED ORDER — ALBUTEROL SULFATE (2.5 MG/3ML) 0.083% IN NEBU: 2.5000 mg | INHALATION_SOLUTION | RESPIRATORY_TRACT | 12 refills | Status: AC | PRN

## 2017-10-16 MED ORDER — ONDANSETRON HCL 4 MG/2ML IJ SOLN: 4.0000 mg | Freq: Three times a day (TID) | INTRAMUSCULAR | 0 refills | Status: AC | PRN

## 2017-10-16 MED ORDER — GUAIFENESIN ER 600 MG PO TB12: 600.0000 mg | ORAL_TABLET | Freq: Two times a day (BID) | ORAL | Status: AC

## 2017-10-16 MED ORDER — SODIUM CHLORIDE 0.9% FLUSH: 10.0000 mL | INTRAVENOUS | Status: AC | PRN

## 2017-10-16 MED ORDER — BUDESONIDE 0.5 MG/2ML IN SUSP: 0.5000 mg | Freq: Two times a day (BID) | RESPIRATORY_TRACT | 12 refills | Status: AC

## 2017-10-16 MED ORDER — MAGIC MOUTHWASH W/LIDOCAINE
15.0000 mL | Freq: Four times a day (QID) | ORAL | 0 refills | Status: AC
Start: 2017-10-16 — End: ?

## 2017-10-16 MED ORDER — FUROSEMIDE 10 MG/ML IJ SOLN: 40.0000 mg | Freq: Two times a day (BID) | INTRAMUSCULAR | 0 refills | Status: AC

## 2017-10-16 MED ORDER — LEVOTHYROXINE SODIUM 100 MCG PO TABS: 100.0000 ug | ORAL_TABLET | Freq: Every day | ORAL | 4 refills | Status: AC

## 2017-10-16 MED ORDER — AMIODARONE HCL 400 MG PO TABS
400.0000 mg | ORAL_TABLET | Freq: Two times a day (BID) | ORAL | Status: AC
Start: 2017-10-16 — End: ?

## 2017-10-16 MED ORDER — PANTOPRAZOLE SODIUM 40 MG PO TBEC
40.0000 mg | DELAYED_RELEASE_TABLET | Freq: Two times a day (BID) | ORAL | Status: AC
Start: 2017-10-16 — End: ?

## 2017-10-16 MED ORDER — SORBITOL 70 % SOLN: 30.0000 mL | Freq: Every day | Status: AC | PRN

## 2017-10-16 MED ORDER — ACETAMINOPHEN 325 MG PO TABS: 325.0000 mg | ORAL_TABLET | Freq: Four times a day (QID) | ORAL | Status: AC | PRN

## 2017-10-16 MED ORDER — IRON 325 (65 FE) MG PO TABS: 1.0000 | ORAL_TABLET | Freq: Every day | ORAL | 0 refills | Status: AC

## 2017-10-16 MED ORDER — HYDROCODONE-ACETAMINOPHEN 5-325 MG PO TABS: 1.0000 | ORAL_TABLET | Freq: Four times a day (QID) | ORAL | 0 refills | Status: AC | PRN

## 2017-10-16 MED ORDER — SENNOSIDES-DOCUSATE SODIUM 8.6-50 MG PO TABS: 2.0000 | ORAL_TABLET | Freq: Every evening | ORAL | Status: AC | PRN

## 2017-10-16 MED ORDER — TRAMADOL HCL 50 MG PO TABS: 50.0000 mg | ORAL_TABLET | Freq: Three times a day (TID) | ORAL | 3 refills | Status: AC | PRN

## 2017-10-16 MED ORDER — ALPRAZOLAM 0.5 MG PO TABS: 0.5000 mg | ORAL_TABLET | Freq: Three times a day (TID) | ORAL | 0 refills | Status: AC | PRN

## 2017-10-16 MED ORDER — ATORVASTATIN CALCIUM 10 MG PO TABS: 10.0000 mg | ORAL_TABLET | Freq: Every day | ORAL | 4 refills | Status: AC

## 2017-10-16 MED ORDER — FLUCONAZOLE 100 MG PO TABS: ORAL_TABLET | ORAL | 0 refills | Status: AC

## 2017-10-16 MED ORDER — MORPHINE SULFATE (PF) 2 MG/ML IV SOLN
2.0000 mg | Freq: Once | INTRAVENOUS | Status: AC
  Administered 2017-10-16: 2 mg via INTRAVENOUS
  Filled 2017-10-16: qty 1

## 2017-10-16 MED ORDER — VALACYCLOVIR HCL 1 G PO TABS: 1000.0000 mg | ORAL_TABLET | Freq: Three times a day (TID) | ORAL | Status: AC

## 2017-10-16 MED ORDER — CHLORHEXIDINE GLUCONATE CLOTH 2 % EX PADS: 6.0000 | MEDICATED_PAD | Freq: Every day | CUTANEOUS | Status: AC

## 2017-10-16 MED ORDER — POLYETHYLENE GLYCOL 3350 17 G PO PACK: 17.0000 g | PACK | Freq: Every day | ORAL | 0 refills | Status: AC | PRN

## 2017-10-16 NOTE — Telephone Encounter (Signed)
Need more information for form. Need all dates of works he has and will miss, and what care he is providing patient that keep him from going to work. (see contact information on FMLA form)

## 2017-10-16 NOTE — Care Management Note (Signed)
Case Management Note  Patient Details  Name: Tonya Mcgee MRN: [id number] Date of Birth: June 04, 1941  Subjective/Objective:   Acute hypoxic resp failure die to ru lobe pna complicated by acute cardiogenic pulmonay edema. MD spoke with patient about [facility],and NCM spoke with spouse , they wanted to speak to both reps.  NCM informed both reps with [facility] and [facility] and they spoke with patient.  NCM called spouse and he states they would like to choose [facility].  [facility] aware and has a bed for the patient today. MD aware.                 Action/Plan: DC to [facility] today.   Expected Discharge Date:  10/07/17               Expected Discharge Plan:  Long Term Acute Care (LTAC)  In-House Referral:     Discharge planning Services  CM Consult  Post Acute Care Choice:    Choice offered to:     DME Arranged:    DME Agency:     HH Arranged:    HH Agency:     Status of Service:  Completed, signed off  If discussed at [employer] of Stay Meetings, dates discussed:    Additional Comments:  Leone Haven, RN 10/25/2017, 2:38 PM

## 2017-10-16 NOTE — Discharge Summary (Addendum)
Discharge Summary  Tonya Mcgee RUE:[id number] DOB: 04/26/1941  PCP: Malva Limes, MD  Admit date: 10/04/2017 Discharge date:    Time spent: < 25 minutes  Admitted From: Home Disposition: LTAC  Recommendations for Outpatient Follow-up:  1. Patient considering [employer] placement/NGT for nutrition, follow-up with patient 2. Speech evaluation consult placed for formal evaluation of subjective dysphagia 3. Start empiric fluconazole therapy for possible oral/ esophageal candidiasis   Discharge Diagnoses:  Active Hospital Problems   Diagnosis Date Noted  . Acute diastolic CHF (congestive heart failure) (HCC)   . Acute upper GI bleed   . Acute blood loss anemia   . Acute respiratory failure with hypoxia (HCC)   . Atrial fibrillation with rapid ventricular response (HCC)   . Acute congestive heart failure (HCC)   . Pulmonary infiltrate   . Acute renal failure (HCC)   . SOB (shortness of breath)   . Community acquired pneumonia 10/04/2017  . Rheumatoid arthritis (HCC) 07/16/2015  . Antiphospholipid syndrome (HCC) 07/15/2015    Resolved Hospital Problems  No resolved problems to display.    Discharge Condition: Stable  CODE STATUS: Full Diet recommendation: Regular, patient considering core tract tube placement to assist in nutrition given dysphagia related to tongue lesions  Vitals:   10/29/2017 0904 10/15/2017 1421  BP:    Pulse: 63   Resp: 20   Temp:    SpO2: 93% (!) 89%    History of present illness:  Tonya Mcgee is a 77 y.o. year old female with medical history significant for hypertension who presented on 10/04/2017 with 1 day of shortness of breath, congestion, orthopnea, was found to be minimally responsive by her husband and was found to have acute hypoxic respiratory failure secondary to H influenza pneumonia with bacteremia requiring BiPAP and treated with IV Zithromax and Azactam (beta-lactam allergy).  Patient declined intubation throughout  hospitalization.  Hospital course was complicated by new onset A. fib with RVR , acute blood loss anemia likely related to NSAID gastritis worsened by IV heparin , new onset diastolic CHF exacerbation, dysphagia related to tongue lesions. Remaining hospital course addressed in problem based format below:   Hospital Course:  Active Problems:   Antiphospholipid syndrome (HCC)   Rheumatoid arthritis (HCC)   Community acquired pneumonia   Acute congestive heart failure (HCC)   Pulmonary infiltrate   Acute renal failure (HCC)   SOB (shortness of breath)   Acute respiratory failure with hypoxia (HCC)   Atrial fibrillation with rapid ventricular response (HCC)   Acute upper GI bleed   Acute blood loss anemia   Acute diastolic CHF (congestive heart failure) (HCC)  1. Acute on chronic hypoxic respiratory failure secondary to right upper lobe pneumonia, community-acquired pneumonia from H influenza.  Still requiring fairly high supplemental oxygen.  Patient has completed IV antibiotics (Zithromax and aztreonam (10 total days).  Continue supportive care with albuterol nebs, budesonide and scheduled Mucinex.   2. Dysphagia. Multiple tongue blisters on tip of tongue and subjective difficulty swallowing for the past week. Started on empiric valtrex for shingles on 5/5. Improved pain control with magic mouthwash.  Patient at high risk for fungal/candidiasis of mouth and esophagus given prior steroid use during hospitalization.  Ideally would benefit from EGD for further evaluation but currently limited given tenuous respiratory status.  Continue Magic mouthwash 4 times a day, nutrition recommends cortrak NGT placement for nutrition(patient would like to consider), and fluconazole for empiric treatment of candidiasis.  SPEECH therapy consult placed for formal evaluation of  swallowing  3. Sepsis secondary to H influenza pneumonia with bacteremia, resolved.   4. Acute renal failure, resolved.  Suspect  prerenal in the setting of sepsis physiology.  Creatinine back at baseline.  Avoid nephrotoxic agents.  5. New onset A. fib, now rate controlled.  Did not tolerate IV Cardizem or esmolol due to hypotension. Converted to normal sinus rhythm with IV amiodarone.  Now on maintenance amiodarone therapy as recommended by cardiology.  IV heparin was discontinued due to GI bleed, patient now longer a candidate for anticoagulation  6. Acute GI bleed with blood loss anemia, stable.  Suspect NSAID related gastritis exacerbated by heparin for A. fib which has now been stopped.  Not a candidate for EGD given tenuous respiratory status throughout hospitalization.  Nadir hgb 6.7 in ICU that required blood transfusions while in ICU.  Hemoglobin currently stable.  Continue twice daily PPI, plan for daily PPI at discharge.  Resume home iron  7. Acute diastolic CHF exacerbation.  TTE on 09/2017: Grade 1 diastolic dysfunction.  Previously up a total of 10 L during admission.  Now net -500 cc, almost 1 L out in last 24 hours.  Lasix 40 mg IV twice daily, daily weights, strict intake and output  8. Tongue lesions, valacyclovir trial started.  Unclear if related to possible candidiasis.  Will start empiric fluconazole ( loading dose then maintenance therapy for 14 days), continue Magic mouthwash 4 times daily.  As mentioned above patient considering NGT placement by cor track  9. Hypertension.  Currently normotensive.  Initially held home blood pressure meds due to soft labile SBP in 60s on admission.  Monitor closely may not need reinitiation during hospital stay  10. Elevated troponin,  slightly elevated on admission at 0.05, most likely demand ischemia in setting of sepsis per cardiology evaluation.  No current chest pain, no ischemic changes on EKG, no wall motion normalities on TTE.  11. Hypothyroidism, TSH within normal limits, continue Synthroid  12. Anxiety, PRN Xanax  13. History of rheumatoid arthritis,  holding home Plaquenil.    Antimicrobials:  Valacyclovir: 5/5  Azithromycin: 4/24- 4/27  Aztreonam: 4/24-5/4  Meropenem 4/24-4/25  Vancomycin 4/24  Cultures:  Blood: 4/24: H.influenza   Blood: 4/25, no growth  Blood: 4/26, no growth  Urine: 4/24, no growth    Consultations:  Cardiology, gastroenterology   Procedures/Studies: TTE, 4/24: EF 55-70%, grade 1 diastolic dysfunction, no wall motion abnormalities  US Renal  Result Date: 10/04/2017 CLINICAL DATA:  Renal failure EXAM: RENAL / URINARY TRACT ULTRASOUND COMPLETE COMPARISON:  None. FINDINGS: Right Kidney: Length: 9.7 cm. Echogenicity within normal limits. No mass or hydronephrosis visualized. Left Kidney: Length: 9.2 cm. Areas of cortical thinning. Normal echotexture. No hydronephrosis or mass. Bladder: Appears normal for degree of bladder distention. IMPRESSION: No acute findings.  No hydronephrosis. Electronically Signed   By: Charlett Nose M.D.   On: 10/04/2017 18:09   Dg Chest Port 1 View  Result Date: 10/12/2017 CLINICAL DATA:  Shortness of breath, respiratory failure. EXAM: PORTABLE CHEST 1 VIEW COMPARISON:  Radiograph of Oct 11, 2017. FINDINGS: Stable cardiomediastinal silhouette. Right-sided PICC line is unchanged in position. No pneumothorax is noted. Stable right upper lobe opacity is noted concerning for pneumonia. Stable left basilar opacity is noted concerning for atelectasis or infiltrate. Bony thorax is unremarkable. No significant pleural effusion is noted. IMPRESSION: Stable bilateral lung opacities as described above. Electronically Signed   By: Lupita Raider, M.D.   On: 10/12/2017 09:14   Dg Chest  Port 1 View  Result Date: 10/11/2017 CLINICAL DATA:  Respiratory failure. EXAM: PORTABLE CHEST 1 VIEW COMPARISON:  Radiographs of October 10, 2017. FINDINGS: Stable cardiomediastinal silhouette. Distal tip of right-sided PICC line is in expected location of cavoatrial junction. Hypoinflation of the lungs is  noted. No pneumothorax is noted. Stable right upper lobe airspace opacity is noted consistent with pneumonia. Stable left basilar opacity is noted concerning for atelectasis or infiltrate. Bony thorax is unremarkable. IMPRESSION: Stable right upper lobe airspace opacity is noted consistent with pneumonia. Stable left basilar atelectasis or infiltrate is noted. Hypoinflation of the lungs. Electronically Signed   By: Lupita Raider, M.D.   On: 10/11/2017 10:14   Dg Chest Port 1 View  Result Date: 10/10/2017 CLINICAL DATA:  77 year old female with shortness breath. Subsequent encounter. EXAM: PORTABLE CHEST 1 VIEW COMPARISON:  10/07/2017 chest x-ray. FINDINGS: Right central line has been placed. The tip projects at the level of the lower right atrium/atrium inferior vena caval junction. To be within the distal superior vena cava, this can be retracted by 5.8 cm. Asymmetric airspace disease with greatest consolidation right upper lobe similar to prior exam and may represent result of infection. Pulmonary edema secondary less likely consideration. Cardiomegaly. Calcified tortuous aorta. No pneumothorax. IMPRESSION: Right central line has been placed. The tip projects at the level of the lower right atrium/atrium inferior vena caval junction. To be within the distal superior vena cava, this can be retracted by 5.8 cm. Asymmetric airspace disease with greatest consolidation right upper lobe similar to prior exam and may represent result of infection. Cardiomegaly. Aortic Atherosclerosis (ICD10-I70.0). These results will be called to the ordering clinician or representative by the Radiologist Assistant, and communication documented in the PACS or zVision Dashboard. Electronically Signed   By: Lacy Duverney M.D.   On: 10/10/2017 09:48   Dg Chest Port 1 View  Result Date: 10/07/2017 CLINICAL DATA:  Absent right-sided breath sounds. EXAM: PORTABLE CHEST 1 VIEW COMPARISON:  10/06/2017 FINDINGS: Interstitial airspace  opacities in the right lung, predominantly in the upper lobe, and at the left lung base, are stable from the previous day's exam. Lung volumes are low. No new lung abnormalities. No convincing pleural effusion.  No pneumothorax. IMPRESSION: 1. No change in the interstitial airspace opacities, most evident in the right upper lobe, consistent with multifocal pneumonia. No new abnormalities. Electronically Signed   By: Amie Portland M.D.   On: 10/07/2017 17:21   Dg Chest Port 1 View  Result Date: 10/06/2017 CLINICAL DATA:  Shortness of breath. EXAM: PORTABLE CHEST 1 VIEW COMPARISON:  Radiograph of October 04, 2017. FINDINGS: Stable cardiomediastinal silhouette. Atherosclerosis of thoracic aorta is noted. No pneumothorax is noted. Right internal jugular catheter is noted with distal tip in expected position of the SVC. Stable right upper lobe airspace opacity is noted most consistent with pneumonia. Stable mild left basilar atelectasis or infiltrate is noted. Bony thorax is unremarkable. IMPRESSION: Stable right upper lobe airspace opacity is noted concerning for pneumonia. Stable mild left basilar subsegmental atelectasis or infiltrate. Aortic Atherosclerosis (ICD10-I70.0). Electronically Signed   By: Lupita Raider, M.D.   On: 10/06/2017 09:15   Dg Chest Portable 1 View  Result Date: 10/04/2017 CLINICAL DATA:  Status post central line placement today. EXAM: PORTABLE CHEST 1 VIEW COMPARISON:  Single view of the chest 10/04/2017. FINDINGS: Right IJ catheter is in place with the tip projecting in the mid to lower superior vena cava. No pneumothorax. Right much worse than left airspace  disease is unchanged. Heart size is upper normal. IMPRESSION: Right IJ catheter projects in the mid to lower superior vena cava. No pneumothorax. No change in right much worse than left airspace disease most consistent with multifocal pneumonia. Electronically Signed   By: Drusilla Kanner M.D.   On: 10/04/2017 13:19   Dg Chest  Port 1 View  Result Date: 10/04/2017 CLINICAL DATA:  Shortness of breath EXAM: PORTABLE CHEST 1 VIEW COMPARISON:  July 11, 2016 FINDINGS: There is extensive airspace consolidation throughout portions of the right upper lobe and right middle lobe. There is fibrosis in both lung bases. There is no edema or consolidation on the left. Heart size and pulmonary vascular normal. No adenopathy. There is aortic atherosclerosis. No evident bone lesions. IMPRESSION: Extensive airspace consolidation on the right involving several segmental regions, consistent with pneumonia. 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 No edema evident. Heart size normal. There is aortic atherosclerosis. No evident adenopathy. Aortic Atherosclerosis (ICD10-I70.0). Electronically Signed   By: Bretta Bang III M.D.   On: 10/04/2017 10:42   [place] Ekg Site Rite  Result Date: 10/07/2017 If Site Rite image not attached, placement could not be confirmed due to current cardiac rhythm.    Discharge Exam: BP (!) 142/55 (BP Location: Left Arm)   Pulse 63   Temp (!) 97.5 F (36.4 C) (Oral)   Resp 20   Ht 5\' 2"  (1.575 m)   Wt 85.1 kg (187 lb 9.8 oz)   SpO2 (!) 89%   BMI 34.31 kg/m   Constitutional:chronically ill appearing female, in no acute distress ENMT: Oropharynx with dry mucous membranes, grouped yellow colored crusting/blisters on anterior surface of tongue, unable to visualize any on distal tongue or oropharynx Cardiovascular: RRR no MRGs, with no peripheral edema Respiratory: Normal respiratory effort on 8 L HFNC, some crackles and rhonchi  Abdomen: Soft,non-tender Skin: No rash ulcers, or lesions. Without skin tenting  Neurologic: Grossly no focal neuro deficit. Psychiatric:flat affect, and mood. Mental status AAOx3     Discharge Instructions You were cared for by a hospitalist during your hospital stay. If you have any questions about your discharge medications or the care you received while you were in the hospital after  you are discharged, you can call the unit and asked to speak with the hospitalist on call if the hospitalist that took care of you is not available. Once you are discharged, your primary care physician will handle any further medical issues. Please note that NO REFILLS for any discharge medications will be authorized once you are discharged, as it is imperative that you return to your primary care physician (or establish a relationship with a primary care physician if you do not have one) for your aftercare needs so that they can reassess your need for medications and monitor your lab values.  Discharge Instructions    Diet - low sodium heart healthy   Complete by:  As directed    Increase activity slowly   Complete by:  As directed      Allergies as of 10/15/2017      Reactions   Iodine Hives   Tolerated amiodarone 09/2017 ANTISEPTICS & DISINFECTANTS Throat swelling   Sulfa Antibiotics    Vomiting and diarrhea   Amoxicillin Hives   Ciprofloxacin Hives   Codeine Other (See Comments), Hives   Weird dreams   Keflex  [cephalexin]    Penicillins Hives   Pravastatin Nausea Only   Tape    skin red irritated  Medication List    STOP taking these medications   albuterol 108 (90 Base) MCG/ACT inhaler Commonly known as:  PROVENTIL HFA;VENTOLIN HFA Replaced by:  albuterol (2.5 MG/3ML) 0.083% nebulizer solution   aspirin 81 MG tablet   cyclobenzaprine 5 MG tablet Commonly known as:  FLEXERIL   furosemide 20 MG tablet Commonly known as:  LASIX Replaced by:  furosemide 10 MG/ML injection   lisinopril-hydrochlorothiazide 10-12.5 MG tablet Commonly known as:  PRINZIDE,ZESTORETIC   MULTIVITAMIN ADULT PO   naproxen 500 MG tablet Commonly known as:  NAPROSYN   Omega 3 1200 MG Caps   PLAQUENIL 200 MG tablet Generic drug:  hydroxychloroquine     TAKE these medications   acetaminophen 325 MG tablet Commonly known as:  TYLENOL Take 1-2 tablets (325-650 mg total) by mouth every  6 (six) hours as needed for mild pain or headache.   albuterol (2.5 MG/3ML) 0.083% nebulizer solution Commonly known as:  PROVENTIL Take 3 mLs (2.5 mg total) by nebulization every 4 (four) hours as needed for wheezing. Replaces:  albuterol 108 (90 Base) MCG/ACT inhaler   ALPRAZolam 0.5 MG tablet Commonly known as:  XANAX Take 1 tablet (0.5 mg total) by mouth 3 (three) times daily as needed for anxiety.   amiodarone 400 MG tablet Commonly known as:  PACERONE Take 1 tablet (400 mg total) by mouth 2 (two) times daily.   atorvastatin 10 MG tablet Commonly known as:  LIPITOR Take 1 tablet (10 mg total) by mouth daily.   budesonide 0.5 MG/2ML nebulizer solution Commonly known as:  PULMICORT Take 2 mLs (0.5 mg total) by nebulization 2 (two) times daily.   Chlorhexidine Gluconate Cloth 2 % Pads Apply 6 each topically daily.   fluconazole 100 MG tablet Commonly known as:  DIFLUCAN Take 4 tablets (400 mg total) by mouth daily for 1 day, THEN 2 tablets (200 mg total) daily for 13 days. Start taking on:  11/06/2017   furosemide 10 MG/ML injection Commonly known as:  LASIX Inject 4 mLs (40 mg total) into the vein every 12 (twelve) hours. Replaces:  furosemide 20 MG tablet   guaiFENesin 600 MG 12 hr tablet Commonly known as:  MUCINEX Take 1 tablet (600 mg total) by mouth 2 (two) times daily.   HYDROcodone-acetaminophen 5-325 MG tablet Commonly known as:  NORCO/VICODIN Take 1-2 tablets by mouth every 6 (six) hours as needed for moderate pain or severe pain.   Iron 325 (65 Fe) MG Tabs Take 1 tablet (325 mg total) by mouth daily.   levothyroxine 100 MCG tablet Commonly known as:  SYNTHROID, LEVOTHROID Take 1 tablet (100 mcg total) by mouth daily.   magic mouthwash w/lidocaine Soln Take 15 mLs by mouth 4 (four) times daily.   ondansetron 4 MG/2ML Soln injection Commonly known as:  ZOFRAN Inject 2 mLs (4 mg total) into the vein every 8 (eight) hours as needed for nausea or  vomiting.   pantoprazole 40 MG tablet Commonly known as:  PROTONIX Take 1 tablet (40 mg total) by mouth 2 (two) times daily.   polyethylene glycol packet Commonly known as:  MIRALAX / GLYCOLAX Take 17 g by mouth daily as needed for mild constipation.   senna-docusate 8.6-50 MG tablet Commonly known as:  Senokot-S Take 2 tablets by mouth at bedtime as needed for mild constipation.   sodium chloride flush 0.9 % Soln Commonly known as:  NS 10-40 mLs by Intracatheter route as needed (flush).   sorbitol 70 % Soln Take 30 mLs by  mouth daily as needed for moderate constipation.   traMADol 50 MG tablet Commonly known as:  ULTRAM Take 1 tablet (50 mg total) by mouth every 8 (eight) hours as needed.   valACYclovir 1000 MG tablet Commonly known as:  VALTREX Take 1 tablet (1,000 mg total) by mouth 3 (three) times daily.      Allergies  Allergen Reactions  . Iodine Hives    Tolerated amiodarone 09/2017 ANTISEPTICS & DISINFECTANTS Throat swelling  . Sulfa Antibiotics     Vomiting and diarrhea  . Amoxicillin Hives  . Ciprofloxacin Hives  . Codeine Other (See Comments) and Hives    Weird dreams  . Keflex  [Cephalexin]   . Penicillins Hives  . Pravastatin Nausea Only  . Tape     skin red irritated      The results of significant diagnostics from this hospitalization (including imaging, microbiology, ancillary and laboratory) are listed below for reference.    Significant Diagnostic Studies: US Renal  Result Date: 10/04/2017 CLINICAL DATA:  Renal failure EXAM: RENAL / URINARY TRACT ULTRASOUND COMPLETE COMPARISON:  None. FINDINGS: Right Kidney: Length: 9.7 cm. Echogenicity within normal limits. No mass or hydronephrosis visualized. Left Kidney: Length: 9.2 cm. Areas of cortical thinning. Normal echotexture. No hydronephrosis or mass. Bladder: Appears normal for degree of bladder distention. IMPRESSION: No acute findings.  No hydronephrosis. Electronically Signed   By: Charlett Nose M.D.   On: 10/04/2017 18:09   Dg Chest Port 1 View  Result Date: 10/12/2017 CLINICAL DATA:  Shortness of breath, respiratory failure. EXAM: PORTABLE CHEST 1 VIEW COMPARISON:  Radiograph of Oct 11, 2017. FINDINGS: Stable cardiomediastinal silhouette. Right-sided PICC line is unchanged in position. No pneumothorax is noted. Stable right upper lobe opacity is noted concerning for pneumonia. Stable left basilar opacity is noted concerning for atelectasis or infiltrate. Bony thorax is unremarkable. No significant pleural effusion is noted. IMPRESSION: Stable bilateral lung opacities as described above. Electronically Signed   By: Lupita Raider, M.D.   On: 10/12/2017 09:14   Dg Chest Port 1 View  Result Date: 10/11/2017 CLINICAL DATA:  Respiratory failure. EXAM: PORTABLE CHEST 1 VIEW COMPARISON:  Radiographs of October 10, 2017. FINDINGS: Stable cardiomediastinal silhouette. Distal tip of right-sided PICC line is in expected location of cavoatrial junction. Hypoinflation of the lungs is noted. No pneumothorax is noted. Stable right upper lobe airspace opacity is noted consistent with pneumonia. Stable left basilar opacity is noted concerning for atelectasis or infiltrate. Bony thorax is unremarkable. IMPRESSION: Stable right upper lobe airspace opacity is noted consistent with pneumonia. Stable left basilar atelectasis or infiltrate is noted. Hypoinflation of the lungs. Electronically Signed   By: Lupita Raider, M.D.   On: 10/11/2017 10:14   Dg Chest Port 1 View  Result Date: 10/10/2017 CLINICAL DATA:  77 year old female with shortness breath. Subsequent encounter. EXAM: PORTABLE CHEST 1 VIEW COMPARISON:  10/07/2017 chest x-ray. FINDINGS: Right central line has been placed. The tip projects at the level of the lower right atrium/atrium inferior vena caval junction. To be within the distal superior vena cava, this can be retracted by 5.8 cm. Asymmetric airspace disease with greatest consolidation right  upper lobe similar to prior exam and may represent result of infection. Pulmonary edema secondary less likely consideration. Cardiomegaly. Calcified tortuous aorta. No pneumothorax. IMPRESSION: Right central line has been placed. The tip projects at the level of the lower right atrium/atrium inferior vena caval junction. To be within the distal superior vena cava, this can be  retracted by 5.8 cm. Asymmetric airspace disease with greatest consolidation right upper lobe similar to prior exam and may represent result of infection. Cardiomegaly. Aortic Atherosclerosis (ICD10-I70.0). These results will be called to the ordering clinician or representative by the Radiologist Assistant, and communication documented in the PACS or zVision Dashboard. Electronically Signed   By: Lacy Duverney M.D.   On: 10/10/2017 09:48   Dg Chest Port 1 View  Result Date: 10/07/2017 CLINICAL DATA:  Absent right-sided breath sounds. EXAM: PORTABLE CHEST 1 VIEW COMPARISON:  10/06/2017 FINDINGS: Interstitial airspace opacities in the right lung, predominantly in the upper lobe, and at the left lung base, are stable from the previous day's exam. Lung volumes are low. No new lung abnormalities. No convincing pleural effusion.  No pneumothorax. IMPRESSION: 1. No change in the interstitial airspace opacities, most evident in the right upper lobe, consistent with multifocal pneumonia. No new abnormalities. Electronically Signed   By: Amie Portland M.D.   On: 10/07/2017 17:21   Dg Chest Port 1 View  Result Date: 10/06/2017 CLINICAL DATA:  Shortness of breath. EXAM: PORTABLE CHEST 1 VIEW COMPARISON:  Radiograph of October 04, 2017. FINDINGS: Stable cardiomediastinal silhouette. Atherosclerosis of thoracic aorta is noted. No pneumothorax is noted. Right internal jugular catheter is noted with distal tip in expected position of the SVC. Stable right upper lobe airspace opacity is noted most consistent with pneumonia. Stable mild left basilar  atelectasis or infiltrate is noted. Bony thorax is unremarkable. IMPRESSION: Stable right upper lobe airspace opacity is noted concerning for pneumonia. Stable mild left basilar subsegmental atelectasis or infiltrate. Aortic Atherosclerosis (ICD10-I70.0). Electronically Signed   By: Lupita Raider, M.D.   On: 10/06/2017 09:15   Dg Chest Portable 1 View  Result Date: 10/04/2017 CLINICAL DATA:  Status post central line placement today. EXAM: PORTABLE CHEST 1 VIEW COMPARISON:  Single view of the chest 10/04/2017. FINDINGS: Right IJ catheter is in place with the tip projecting in the mid to lower superior vena cava. No pneumothorax. Right much worse than left airspace disease is unchanged. Heart size is upper normal. IMPRESSION: Right IJ catheter projects in the mid to lower superior vena cava. No pneumothorax. No change in right much worse than left airspace disease most consistent with multifocal pneumonia. Electronically Signed   By: Drusilla Kanner M.D.   On: 10/04/2017 13:19   Dg Chest Port 1 View  Result Date: 10/04/2017 CLINICAL DATA:  Shortness of breath EXAM: PORTABLE CHEST 1 VIEW COMPARISON:  July 11, 2016 FINDINGS: There is extensive airspace consolidation throughout portions of the right upper lobe and right middle lobe. There is fibrosis in both lung bases. There is no edema or consolidation on the left. Heart size and pulmonary vascular normal. No adenopathy. There is aortic atherosclerosis. No evident bone lesions. IMPRESSION: Extensive airspace consolidation on the right involving several segmental regions, consistent with pneumonia. 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 No edema evident. Heart size normal. There is aortic atherosclerosis. No evident adenopathy. Aortic Atherosclerosis (ICD10-I70.0). Electronically Signed   By: Bretta Bang III M.D.   On: 10/04/2017 10:42   [place] Ekg Site Rite  Result Date: 10/07/2017 If Site Rite image not attached, placement could not be confirmed due to  current cardiac rhythm.   Microbiology: No results found for this or any previous visit (from the past 240 hour(s)).   Labs: Basic Metabolic Panel: Recent Labs  Lab 10/11/17 0350 10/12/17 0334 10/13/17 0409 10/15/17 0219 10/27/17 0322  NA 143 141 142 137 138  K 5.1 4.7 4.5 3.9 3.8  CL 110 106 103 92* 92*  CO2 24 26 30  35* 34*  GLUCOSE 131* 125* 110* 98 89  BUN 55* 47* 46* 39* 44*  CREATININE 1.06* 1.02* 1.03* 0.99 1.13*  CALCIUM 7.9* 8.1* 8.3* 8.4* 8.2*  MG 1.8  --   --   --   --    Liver Function Tests: No results for input(s): AST, ALT, ALKPHOS, BILITOT, PROT, ALBUMIN in the last 168 hours. No results for input(s): LIPASE, AMYLASE in the last 168 hours. No results for input(s): AMMONIA in the last 168 hours. CBC: Recent Labs  Lab 10/10/17 0442 10/11/17 0350 10/12/17 0334 10/13/17 0409 10/15/17 0219 10-30-2017 0322  WBC 18.9* 22.4* 22.7* 23.5* 21.3* 17.8*  NEUTROABS 17.1*  --  21.1* 22.3* 19.7* 14.5*  HGB 8.7* 8.8* 9.2* 9.7* 10.6* 11.1*  HCT 26.1* 27.6* 28.9* 30.7* 32.8* 34.2*  MCV 94.2 95.2 95.4 95.9 96.2 96.9  PLT 235 289 339 385 415* 421*   Cardiac Enzymes: No results for input(s): CKTOTAL, CKMB, CKMBINDEX, TROPONINI in the last 168 hours. BNP: BNP (last 3 results) Recent Labs    02/07/17 1159 10/04/17 0954 10/06/17 0655  BNP 169.1* 417.6* 986.7*    ProBNP (last 3 results) No results for input(s): PROBNP in the last 8760 hours.  CBG: No results for input(s): GLUCAP in the last 168 hours.     Signed:  Laverna Peace, MD [facility] 2017-10-30, 3:40 PM

## 2017-10-16 NOTE — Progress Notes (Signed)
Progress Note  Patient Name: Tonya Mcgee Date of Encounter: 10/30/2017  Primary Cardiologist: No primary care provider on file.   Subjective   No CP or palpitations  Inpatient Medications    Scheduled Meds: . amiodarone  400 mg Oral BID  . budesonide (PULMICORT) nebulizer solution  0.5 mg Nebulization BID  . chlorhexidine  15 mL Mouth Rinse BID  . Chlorhexidine Gluconate Cloth  6 each Topical Daily  . feeding supplement  1 Container Oral TID BM  . furosemide  40 mg Intravenous Q12H  . guaiFENesin  600 mg Oral BID  . levothyroxine  100 mcg Oral QAC breakfast  . magic mouthwash w/lidocaine  15 mL Oral QID  . mouth rinse  15 mL Mouth Rinse q12n4p  . pantoprazole  40 mg Oral BID  . valACYclovir  1,000 mg Oral TID   Continuous Infusions:  PRN Meds: acetaminophen, albuterol, ALPRAZolam, HYDROcodone-acetaminophen, ondansetron, polyethylene glycol, senna-docusate, sodium chloride flush, sorbitol, traMADol   Vital Signs    Vitals:   11/01/2017 0422 10/20/2017 0555 11/07/2017 0836 10/15/2017 0904  BP:   (!) 142/55   Pulse:  76 75 63  Resp:  20  20  Temp:   (!) 97.5 F (36.4 C)   TempSrc:   Oral   SpO2:  94% 94% 93%  Weight: 187 lb 9.8 oz (85.1 kg)     Height:        Intake/Output Summary (Last 24 hours) at 11/10/2017 1028 Last data filed at 10/30/2017 0932 Gross per 24 hour  Intake 720 ml  Output 1800 ml  Net -1080 ml   Filed Weights   10/05/17 0900 10/27/2017 0422  Weight: 209 lb 7 oz (95 kg) 187 lb 9.8 oz (85.1 kg)    Telemetry    NSR - Personally Reviewed  ECG      Physical Exam   GEN: No acute distress.  Frail Neck: No JVD Cardiac: RRR, no murmurs, rubs, or gallops.  Respiratory: Clear to auscultation bilaterally. GI: Soft, nontender, non-distended  MS: No edema; No deformity. Neuro:  Nonfocal  Psych: flat affect  Oral: Question of shingles on tongue with some papules noted  Labs    Chemistry Recent Labs  Lab 10/13/17 0409 10/15/17 0219  10/15/2017 0322  NA 142 137 138  K 4.5 3.9 3.8  CL 103 92* 92*  CO2 30 35* 34*  GLUCOSE 110* 98 89  BUN 46* 39* 44*  CREATININE 1.03* 0.99 1.13*  CALCIUM 8.3* 8.4* 8.2*  GFRNONAA 51* 53* 45*  GFRAA 59* >60 53*  ANIONGAP 9 10 12      Hematology Recent Labs  Lab 10/13/17 0409 10/15/17 0219 10/25/2017 0322  WBC 23.5* 21.3* 17.8*  RBC 3.20* 3.41* 3.53*  HGB 9.7* 10.6* 11.1*  HCT 30.7* 32.8* 34.2*  MCV 95.9 96.2 96.9  MCH 30.3 31.1 31.4  MCHC 31.6 32.3 32.5  RDW 15.3 15.1 15.5  PLT 385 415* 421*    Cardiac EnzymesNo results for input(s): TROPONINI in the last 168 hours. No results for input(s): TROPIPOC in the last 168 hours.   BNPNo results for input(s): BNP, PROBNP in the last 168 hours.   DDimer No results for input(s): DDIMER in the last 168 hours.   Radiology    No results found.  Cardiac Studies   EF 65-70%  Patient Profile     77 y.o. female with PAF  Assessment & Plan    PAF: Now on oral amiodarone.  Not a candidate for OAC.  Check ECG on occasion given amiodarone load.   Labs stable after diuresis for diastolic heart failure.  Appears to be breathing well.   Will follow.    For questions or updates, please contact [facility] Please consult [email or web address] for contact info under Cardiology/STEMI.      Signed, Lance Muss, MD  11/05/2017, 10:28 AM

## 2017-10-16 NOTE — Telephone Encounter (Signed)
Pt's husband Dimas Aguas called to check on the status of FMLA forms that were dropped off on 10/11/17 or 10/12/17 (he stated he couldn't remember which day). Dimas Aguas stated the forms are for his son so he can miss work since he is helping care for pt. Dimas Aguas was advised that forms can take 10 to 14 business days to be completed. Dimas Aguas stated that they need them no later than Friday 10/20/17. I tried to explain that the forms are worked on as soon as they can but they take time to be completed. Howard requested that Dr. Sherrie Mustache return his call to discuss the forms. Please advise. Thanks TNP

## 2017-10-16 NOTE — Telephone Encounter (Signed)
Please advise 

## 2017-10-16 NOTE — Progress Notes (Signed)
PROGRESS NOTE  Tonya Mcgee UOH:[id number] DOB: 1941/05/06 DOA: 10/04/2017 PCP: Malva Limes, MD  HPI  Tonya Mcgee is a 77 y.o. year old female with medical history significant for hypertension who presented on 10/04/2017 with 1 day of shortness of breath, congestion, orthopnea, was found to be minimally responsive by her husband and was found to have acute hypoxic respiratory failure H influenza pneumonia with bacteremia requiring BiPAP and treated with IV Zithromax and Azactam (beta-lactam allergy).  Patient declined intubation during hospitalization.  Hospital course was comp gated by new onset A. fib with RVR now normal sinus rhythm on maintenance amiodarone, acute blood loss anemia likely related to NSAID gastritis worsened by IV heparin now stable on oral PPI, new onset diastolic CHF exacerbation improving with diuresis.  [facility].  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  ZMC:EYEMVV pain, poor appetite, no chest pain, some cough, better shortness of breath    Subjective Doesn't want to eat because of tongue pain  Assessment/Plan: Active Problems:   Antiphospholipid syndrome (HCC)   Rheumatoid arthritis (HCC)   Community acquired pneumonia   Acute congestive heart failure (HCC)   Pulmonary infiltrate   Acute renal failure (HCC)   SOB (shortness of breath)   Acute respiratory failure with hypoxia (HCC)   Atrial fibrillation with rapid ventricular response (HCC)   Acute upper GI bleed   Acute blood loss anemia   Acute diastolic CHF (congestive heart failure) (HCC)  1. Acute on chronic hypoxic respiratory failure secondary to right upper lobe pneumonia, community-acquired pneumonia from H influenza.  Completed IV Zithromax and aztreonam antibiotic therapy (10 days total).  The requiring supplemental oxygen above home baseline.  On albuterol nebs, budesonide.  Scheduled  Mucinex  2. Dysphagia. Multiple tongue blisters on tip of tongue and subjective difficulty swallowing for the past week. Started on empiric valtrex for shingles on 5/5. Improved pain control with magic mouthwash, which has since been increased with frequency. I wonder if this is fungal given she was on steroids for some time. She's not getting much oral intake and not tolerating much by mouth. Nutrition recommends cortrak tube placement. Ideally would benefit from EGD for further evaluation but currently limited given tenuous respiratory status ( 10 L HF [place])  3. Sepsis secondary to H influenza pneumonia with bacteremia.  Sepsis physiology now resolved blood cultures (4/24 + for H influenza)  4. Acute renal failure, resolved.  Suspect prerenal in the setting of sepsis physiology.  Creatinine back at baseline.  Avoid nephrotoxic agents.  5. New onset A. fib, now rate controlled.  Did not tolerate IV Cardizem or esmolol due to hypotension.converted to normal sinus rhythm with IV amiodarone per cardiology.  Now on maintenance therapy.  IV heparin was discontinued due to GI bleed  6. Acute GI bleed with blood loss anemia, stable.  Suspect NSAID related gastritis exacerbated by heparin for A. fib which has now been stopped.  Not a candidate for EGD given tenuous respiratory status throughout hospitalization.  Nadir hgb 6.7 in ICU that required blood transfusions while in ICU.  Hemoglobin currently stable.  Continue twice daily PPI, plan for daily PPI at discharge  7. Acute diastolic CHF.  TTE on 09/2017: Grade 1 diastolic dysfunction.  Previously up a total of 10 L during admission.  Now net -500 cc, almost 1 L out in last 24 hours.  Lasix 40 mg IV twice daily, daily weights, strict  intake and output  8. Tongue lesions, valacyclovir trial started  9. Hypertension.  Currently normotensive.  Initially held home blood pressure meds due to soft labile SBP in 60s on admission.  Monitor closely may not need  reinitiation during hospital stay  10. Elevated troponin, appears slightly elevated on admission at 0.05, most likely demand ischemia in setting of sepsis per cardiology evaluation.  No current chest pain, no ischemic changes on EKG, no wall motion normalities on TTE.  11. Hypothyroidism, TSH within normal limits, continue Synthroid  12. Anxiety, PRN Xanax  13. History of rheumatoid arthritis, holding home Plaquenil.   Code Status: Full code  Family Communication: Husband at bedside  Disposition Plan: PT recommends SNF, family discussing [facility] ( [facility]) cortrak placement for nutrition, will discuss with husband   Consultants:  Gastroenterology Dr. Leone Payor, 4/29  Cardiology, 4/24   Procedures:  TTE, 4/24  Antimicrobials:  Valacyclovir: 5/5  Azithromycin: 4/24- 4/27  Aztreonam: 4/24-5/4  Meropenem 4/24-4/25  Vancomycin 4/24  Cultures:  Blood: 4/24: H.influenza   Blood: 4/25, no growth  Blood: 4/26, no growth  Urine: 4/24, no growth  Telemetry:yes  DVT prophylaxis: SCDs   Objective: Vitals:   10/15/17 1607 10/27/2017 0038 11/09/2017 0422 11/08/2017 0555  BP: 140/66 (!) 123/56    Pulse: 72 73  76  Resp: (!) 23   20  Temp: 97.6 F (36.4 C) 97.8 F (36.6 C)    TempSrc: Oral Oral    SpO2: 93% 95%  94%  Weight:   85.1 kg (187 lb 9.8 oz)   Height:        Intake/Output Summary (Last 24 hours) at 10/15/2017 0750 Last data filed at 11/08/2017 3220 Gross per 24 hour  Intake 970 ml  Output 950 ml  Net 20 ml   Filed Weights   10/05/17 0900 10/19/2017 0422  Weight: 95 kg (209 lb 7 oz) 85.1 kg (187 lb 9.8 oz)    Exam:  Constitutional:chronically ill appearing female, in no acute distress ENMT: Oropharynx with dry mucous membranes, grouped yellow colored crusting/blisters on anterior surface of tongue, unable to visualize any on distal tongue or oropharynx Cardiovascular: RRR no MRGs, with no peripheral edema Respiratory: Normal  respiratory effort on 10 L HFNC, some crackles and rhonchi  Abdomen: Soft,non-tender Skin: No rash ulcers, or lesions. Without skin tenting  Neurologic: Grossly no focal neuro deficit. Psychiatric:flat affect, and mood. Mental status AAOx3  Data Reviewed: CBC: Recent Labs  Lab 10/10/17 0442 10/11/17 0350 10/12/17 0334 10/13/17 0409 10/15/17 0219 10/26/2017 0322  WBC 18.9* 22.4* 22.7* 23.5* 21.3* 17.8*  NEUTROABS 17.1*  --  21.1* 22.3* 19.7* 14.5*  HGB 8.7* 8.8* 9.2* 9.7* 10.6* 11.1*  HCT 26.1* 27.6* 28.9* 30.7* 32.8* 34.2*  MCV 94.2 95.2 95.4 95.9 96.2 96.9  PLT 235 289 339 385 415* 421*   Basic Metabolic Panel: Recent Labs  Lab 10/11/17 0350 10/12/17 0334 10/13/17 0409 10/15/17 0219 10/30/2017 0322  NA 143 141 142 137 138  K 5.1 4.7 4.5 3.9 3.8  CL 110 106 103 92* 92*  CO2 24 26 30  35* 34*  GLUCOSE 131* 125* 110* 98 89  BUN 55* 47* 46* 39* 44*  CREATININE 1.06* 1.02* 1.03* 0.99 1.13*  CALCIUM 7.9* 8.1* 8.3* 8.4* 8.2*  MG 1.8  --   --   --   --    GFR: Estimated Creatinine Clearance: 41.5 mL/min (A) (by C-G formula based on SCr of 1.13 mg/dL (H)). Liver Function Tests: No  results for input(s): AST, ALT, ALKPHOS, BILITOT, PROT, ALBUMIN in the last 168 hours. No results for input(s): LIPASE, AMYLASE in the last 168 hours. No results for input(s): AMMONIA in the last 168 hours. Coagulation Profile: No results for input(s): INR, PROTIME in the last 168 hours. Cardiac Enzymes: No results for input(s): CKTOTAL, CKMB, CKMBINDEX, TROPONINI in the last 168 hours. BNP (last 3 results) No results for input(s): PROBNP in the last 8760 hours. HbA1C: No results for input(s): HGBA1C in the last 72 hours. CBG: No results for input(s): GLUCAP in the last 168 hours. Lipid Profile: No results for input(s): CHOL, HDL, LDLCALC, TRIG, CHOLHDL, LDLDIRECT in the last 72 hours. Thyroid Function Tests: No results for input(s): TSH, T4TOTAL, FREET4, T3FREE, THYROIDAB in the last 72  hours. Anemia Panel: No results for input(s): VITAMINB12, FOLATE, FERRITIN, TIBC, IRON, RETICCTPCT in the last 72 hours. Urine analysis:    Component Value Date/Time   COLORURINE YELLOW 10/04/2017 1753   APPEARANCEUR CLEAR 10/04/2017 1753   LABSPEC 1.016 10/04/2017 1753   PHURINE 5.0 10/04/2017 1753   GLUCOSEU NEGATIVE 10/04/2017 1753   HGBUR MODERATE (A) 10/04/2017 1753   BILIRUBINUR NEGATIVE 10/04/2017 1753   KETONESUR NEGATIVE 10/04/2017 1753   PROTEINUR 30 (A) 10/04/2017 1753   NITRITE NEGATIVE 10/04/2017 1753   LEUKOCYTESUR NEGATIVE 10/04/2017 1753   Sepsis Labs: @LABRCNTIP (procalcitonin:4,lacticidven:4)  ) Recent Results (from the past 240 hour(s))  Culture, blood (Routine X 2) w Reflex to ID Panel     Status: None   Collection Time: 10/06/17 12:06 PM  Result Value Ref Range Status   Specimen Description BLOOD LEFT HAND  Final   Special Requests   Final    BOTTLES DRAWN AEROBIC ONLY Blood Culture adequate volume   Culture   Final    NO GROWTH 5 DAYS Performed at [facility], [address], [place], [place]    Report Status 10/11/2017 FINAL  Final      Studies: No results found.  Scheduled Meds: . amiodarone  400 mg Oral BID  . budesonide (PULMICORT) nebulizer solution  0.5 mg Nebulization BID  . chlorhexidine  15 mL Mouth Rinse BID  . Chlorhexidine Gluconate Cloth  6 each Topical Daily  . feeding supplement  1 Container Oral TID BM  . furosemide  40 mg Intravenous Q12H  . guaiFENesin  600 mg Oral BID  . levothyroxine  100 mcg Oral QAC breakfast  . magic mouthwash w/lidocaine  15 mL Oral QID  . mouth rinse  15 mL Mouth Rinse q12n4p  . pantoprazole  40 mg Oral BID  . valACYclovir  1,000 mg Oral TID    Continuous Infusions:   LOS: 12 days     12/11/2017, MD [facility] Pager [phone]  If 7PM-7AM, please contact night-coverage [email or web address] Password [email or web address] Oct 25, 2017, 7:50 AM

## 2017-10-16 NOTE — NC FL2 (Signed)
[place] MEDICAID FL2 LEVEL OF CARE SCREENING TOOL     IDENTIFICATION  Patient Name: Tonya Mcgee Birthdate: May 22, 1941 Sex: female Admission Date (Current Location): 10/04/2017  [facility] and [place] Number:  Chiropodist and Address:  [facility], [address], [place], [place] [address]      Provider Number: [id number]  Attending Physician Name and Address:  Laverna Peace, MD  Relative Name and Phone Number:       Current Level of Care: Hospital Recommended Level of Care: Skilled Nursing Facility Prior Approval Number:    Date Approved/Denied:   PASRR Number: [id number]  Discharge Plan: SNF    Current Diagnoses: Patient Active Problem List   Diagnosis Date Noted  . Acute diastolic CHF (congestive heart failure) (HCC)   . Acute upper GI bleed   . Acute blood loss anemia   . Acute respiratory failure with hypoxia (HCC)   . Atrial fibrillation with rapid ventricular response (HCC)   . Acute congestive heart failure (HCC)   . Pulmonary infiltrate   . Acute renal failure (HCC)   . SOB (shortness of breath)   . Septic shock (HCC) 10/04/2017  . Community acquired pneumonia 10/04/2017  . Back pain 02/07/2017  . Aortic atherosclerosis (HCC) 10/14/2015  . Arthralgia 08/11/2015  . Osteoarthritis 07/16/2015  . Rheumatoid arthritis (HCC) 07/16/2015  . Iron deficiency anemia 07/16/2015  . Antiphospholipid syndrome (HCC) 07/15/2015  . Arthritis 07/15/2015  . Carotid bruit 07/15/2015  . 1st degree AV block 07/15/2015  . History of blood clots 07/15/2015  . History of Graves' disease 07/15/2015  . Hypercholesterolemia 07/15/2015  . Hypertension 07/15/2015  . 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 Joint swelling 07/15/2015    Orientation RESPIRATION BLADDER Height & Weight     Self, Time, Situation, Place  O2(see DC summary) Incontinent, External catheter Weight: 187 lb 9.8 oz (85.1 kg) Height:  5\' 2"  (157.5 cm)  BEHAVIORAL  SYMPTOMS/MOOD NEUROLOGICAL BOWEL NUTRITION STATUS      Continent Diet(cardiac)  AMBULATORY STATUS COMMUNICATION OF NEEDS Skin   Extensive Assist Verbally Normal                       Personal Care Assistance Level of Assistance  Bathing, Dressing Bathing Assistance: Maximum assistance   Dressing Assistance: Maximum assistance     Functional Limitations Info             SPECIAL CARE FACTORS FREQUENCY  PT (By licensed PT), OT (By licensed OT)     PT Frequency: 5/wk OT Frequency: 5/wk            Contractures      Additional Factors Info  Code Status, Allergies Code Status Info: FULL Allergies Info:  Iodine, Sulfa Antibiotics, Amoxicillin, Ciprofloxacin, Codeine, Keflex  Cephalexin, Penicillins, Pravastatin, Tape           Current Medications (11/08/2017):  This is the current hospital active medication list Current Facility-Administered Medications  Medication Dose Route Frequency Provider Last Rate Last Dose  . acetaminophen (TYLENOL) tablet 325-650 mg  325-650 mg Oral Q6H PRN 12/16/2017, MD   650 mg at 10/15/17 2054  . albuterol (PROVENTIL) (2.5 MG/3ML) 0.083% nebulizer solution 2.5 mg  2.5 mg Nebulization Q4H PRN 2055, NP   2.5 mg at 10/12/17 2313  . ALPRAZolam 12/12/17) tablet 0.5 mg  0.5 mg Oral TID PRN Prudy Feeler A, MD   0.5 mg at 10/15/17 2057  . amiodarone (  PACERONE) tablet 400 mg  400 mg Oral BID Alyson Reedy, MD   400 mg at 10-17-2017 1021  . budesonide (PULMICORT) nebulizer solution 0.5 mg  0.5 mg Nebulization BID Duayne Cal, NP   0.5 mg at Oct 17, 2017 0905  . chlorhexidine (PERIDEX) 0.12 % solution 15 mL  15 mL Mouth Rinse BID Alyson Reedy, MD   15 mL at 10/15/17 2053  . Chlorhexidine Gluconate Cloth 2 % PADS 6 each  6 each Topical Daily Alyson Reedy, MD   6 each at 10/15/17 1158  . feeding supplement (BOOST / RESOURCE BREEZE) liquid 1 Container  1 Container Oral TID BM Crim, Courtney, MD   1 Container at 10/13/17 1000  .  furosemide (LASIX) injection 40 mg  40 mg Intravenous Q12H Quintella Reichert, MD   40 mg at Oct 17, 2017 0818  . guaiFENesin (MUCINEX) 12 hr tablet 600 mg  600 mg Oral BID Alyson Reedy, MD   600 mg at 10-17-2017 1021  . HYDROcodone-acetaminophen (NORCO/VICODIN) 5-325 MG per tablet 1-2 tablet  1-2 tablet Oral Q6H PRN Macon Large, NP   2 tablet at 10/15/17 2327  . levothyroxine (SYNTHROID, LEVOTHROID) tablet 100 mcg  100 mcg Oral QAC breakfast Alyson Reedy, MD   100 mcg at 17-Oct-2017 0751  . magic mouthwash w/lidocaine  15 mL Oral QID Stevie Kern A, NP   15 mL at Oct 17, 2017 0817  . MEDLINE mouth rinse  15 mL Mouth Rinse q12n4p Alyson Reedy, MD   15 mL at 10/15/17 1158  . ondansetron (ZOFRAN) injection 4 mg  4 mg Intravenous Q8H PRN Deterding, Dorise Hiss, MD   4 mg at 10/09/17 2100  . pantoprazole (PROTONIX) EC tablet 40 mg  40 mg Oral BID Dianah Field, PA-C   40 mg at 2017/10/17 1022  . polyethylene glycol (MIRALAX / GLYCOLAX) packet 17 g  17 g Oral Daily PRN Alyson Reedy, MD      . senna-docusate (Senokot-S) tablet 2 tablet  2 tablet Oral QHS PRN Alyson Reedy, MD   2 tablet at 10/14/17 1248  . sodium chloride flush (NS) 0.9 % injection 10-40 mL  10-40 mL Intracatheter PRN Alyson Reedy, MD      . sorbitol 70 % solution 30 mL  30 mL Oral Daily PRN Alyson Reedy, MD   30 mL at 10/08/17 1433  . traMADol (ULTRAM) tablet 50 mg  50 mg Oral Q6H PRN Arrien, York Ram, MD   50 mg at 10/15/17 1640  . valACYclovir (VALTREX) tablet 1,000 mg  1,000 mg Oral TID Coralie Keens, MD   1,000 mg at Oct 17, 2017 1020     Discharge Medications: Please see discharge summary for a list of discharge medications.  Relevant Imaging Results:  Relevant Lab Results:   Additional Information SS#: [id number]  Burna Sis, LCSW

## 2017-10-17 ENCOUNTER — Other Ambulatory Visit (HOSPITAL_COMMUNITY): Payer: Self-pay

## 2017-10-17 DIAGNOSIS — R918 Other nonspecific abnormal finding of lung field: Secondary | ICD-10-CM | POA: Diagnosis not present

## 2017-10-17 LAB — CBC WITH DIFFERENTIAL/PLATELET
BASOS PCT: 0 %
Basophils Absolute: 0 10*3/uL (ref 0.0–0.1)
EOS PCT: 1 %
Eosinophils Absolute: 0.2 10*3/uL (ref 0.0–0.7)
HEMATOCRIT: 35.1 % — AB (ref 36.0–46.0)
Hemoglobin: 11.3 g/dL — ABNORMAL LOW (ref 12.0–15.0)
LYMPHS ABS: 2 10*3/uL (ref 0.7–4.0)
Lymphocytes Relative: 13 %
MCH: 31.4 pg (ref 26.0–34.0)
MCHC: 32.2 g/dL (ref 30.0–36.0)
MCV: 97.5 fL (ref 78.0–100.0)
MONO ABS: 0.8 10*3/uL (ref 0.1–1.0)
MONOS PCT: 5 %
NEUTROS ABS: 12.3 10*3/uL — AB (ref 1.7–7.7)
Neutrophils Relative %: 81 %
PLATELETS: 406 10*3/uL — AB (ref 150–400)
RBC: 3.6 MIL/uL — ABNORMAL LOW (ref 3.87–5.11)
RDW: 15.7 % — AB (ref 11.5–15.5)
WBC: 15.3 10*3/uL — ABNORMAL HIGH (ref 4.0–10.5)

## 2017-10-17 LAB — MAGNESIUM: Magnesium: 1.6 mg/dL — ABNORMAL LOW (ref 1.7–2.4)

## 2017-10-17 LAB — COMPREHENSIVE METABOLIC PANEL
ALBUMIN: 2.2 g/dL — AB (ref 3.5–5.0)
ALT: 21 U/L (ref 14–54)
AST: 24 U/L (ref 15–41)
Alkaline Phosphatase: 89 U/L (ref 38–126)
Anion gap: 10 (ref 5–15)
BUN: 42 mg/dL — AB (ref 6–20)
CHLORIDE: 91 mmol/L — AB (ref 101–111)
CO2: 36 mmol/L — ABNORMAL HIGH (ref 22–32)
CREATININE: 1.25 mg/dL — AB (ref 0.44–1.00)
Calcium: 8.5 mg/dL — ABNORMAL LOW (ref 8.9–10.3)
GFR calc Af Amer: 46 mL/min — ABNORMAL LOW (ref >60)
GFR, EST NON AFRICAN AMERICAN: 40 mL/min — AB (ref >60)
GLUCOSE: 72 mg/dL (ref 65–99)
POTASSIUM: 3.4 mmol/L — AB (ref 3.5–5.1)
Sodium: 137 mmol/L (ref 135–145)
Total Bilirubin: 0.9 mg/dL (ref 0.3–1.2)
Total Protein: 5.6 g/dL — ABNORMAL LOW (ref 6.5–8.1)

## 2017-10-17 LAB — PROTIME-INR
INR: 1.06
Prothrombin Time: 13.7 seconds (ref 11.4–15.2)

## 2017-10-17 LAB — TSH: TSH: 5.657 u[IU]/mL — AB (ref 0.350–4.500)

## 2017-10-17 NOTE — Telephone Encounter (Signed)
All dates out of work 10/04/2017 through 10/23/2017. Has been staying with patient around the clock and providing care.

## 2017-10-18 ENCOUNTER — Other Ambulatory Visit (HOSPITAL_COMMUNITY): Payer: Self-pay

## 2017-10-18 DIAGNOSIS — R112 Nausea with vomiting, unspecified: Secondary | ICD-10-CM | POA: Diagnosis not present

## 2017-10-18 LAB — BLOOD GAS, ARTERIAL
Acid-Base Excess: 7 mmol/L — ABNORMAL HIGH (ref 0.0–2.0)
BICARBONATE: 31.2 mmol/L — AB (ref 20.0–28.0)
FIO2: 70
O2 CONTENT: 20 L/min
O2 Saturation: 96.6 %
PCO2 ART: 45.6 mmHg (ref 32.0–48.0)
PO2 ART: 89.4 mmHg (ref 83.0–108.0)
Patient temperature: 98.6
pH, Arterial: 7.449 (ref 7.350–7.450)

## 2017-10-18 LAB — BASIC METABOLIC PANEL
Anion gap: 14 (ref 5–15)
BUN: 55 mg/dL — AB (ref 6–20)
CALCIUM: 8.6 mg/dL — AB (ref 8.9–10.3)
CHLORIDE: 90 mmol/L — AB (ref 101–111)
CO2: 32 mmol/L (ref 22–32)
CREATININE: 2.08 mg/dL — AB (ref 0.44–1.00)
GFR calc Af Amer: 25 mL/min — ABNORMAL LOW (ref >60)
GFR, EST NON AFRICAN AMERICAN: 22 mL/min — AB (ref >60)
Glucose, Bld: 116 mg/dL — ABNORMAL HIGH (ref 65–99)
Potassium: 4.2 mmol/L (ref 3.5–5.1)
SODIUM: 136 mmol/L (ref 135–145)

## 2017-10-18 LAB — MAGNESIUM: MAGNESIUM: 2.4 mg/dL (ref 1.7–2.4)

## 2017-10-18 NOTE — Telephone Encounter (Signed)
Please advise forms? 

## 2017-10-18 NOTE — Telephone Encounter (Signed)
Pt's dad called today 10/18/17 to check and see if the paperwork for FMLA for Tonya Mcgee had been completed yet.  I checked with Marcelino Duster and she said it had not.  I relayed the message to Tonya Mcgee.  Thanks [employer]

## 2017-10-19 ENCOUNTER — Other Ambulatory Visit (HOSPITAL_COMMUNITY): Payer: Self-pay

## 2017-10-19 DIAGNOSIS — Z4682 Encounter for fitting and adjustment of non-vascular catheter: Secondary | ICD-10-CM | POA: Diagnosis not present

## 2017-10-19 LAB — BASIC METABOLIC PANEL
ANION GAP: 10 (ref 5–15)
BUN: 49 mg/dL — ABNORMAL HIGH (ref 6–20)
CALCIUM: 7.4 mg/dL — AB (ref 8.9–10.3)
CO2: 29 mmol/L (ref 22–32)
Chloride: 98 mmol/L — ABNORMAL LOW (ref 101–111)
Creatinine, Ser: 1.57 mg/dL — ABNORMAL HIGH (ref 0.44–1.00)
GFR, EST AFRICAN AMERICAN: 35 mL/min — AB (ref >60)
GFR, EST NON AFRICAN AMERICAN: 30 mL/min — AB (ref >60)
GLUCOSE: 106 mg/dL — AB (ref 65–99)
Potassium: 3 mmol/L — ABNORMAL LOW (ref 3.5–5.1)
SODIUM: 137 mmol/L (ref 135–145)

## 2017-10-19 LAB — CULTURE, BLOOD (ROUTINE X 2): SPECIAL REQUESTS: ADEQUATE

## 2017-10-19 LAB — CBC
HCT: 29.2 % — ABNORMAL LOW (ref 36.0–46.0)
Hemoglobin: 9.4 g/dL — ABNORMAL LOW (ref 12.0–15.0)
MCH: 30.8 pg (ref 26.0–34.0)
MCHC: 32.2 g/dL (ref 30.0–36.0)
MCV: 95.7 fL (ref 78.0–100.0)
PLATELETS: 262 10*3/uL (ref 150–400)
RBC: 3.05 MIL/uL — ABNORMAL LOW (ref 3.87–5.11)
RDW: 15.5 % (ref 11.5–15.5)
WBC: 20.1 10*3/uL — AB (ref 4.0–10.5)

## 2017-10-19 LAB — LACTIC ACID, PLASMA: LACTIC ACID, VENOUS: 1 mmol/L (ref 0.5–1.9)

## 2017-10-19 NOTE — Telephone Encounter (Signed)
Forms are complete and ready to pickup. Left detailed message on home phone.

## 2017-10-20 LAB — URINALYSIS, ROUTINE W REFLEX MICROSCOPIC
Bacteria, UA: NONE SEEN
Bilirubin Urine: NEGATIVE
Glucose, UA: NEGATIVE mg/dL
Ketones, ur: NEGATIVE mg/dL
Leukocytes, UA: NEGATIVE
Nitrite: NEGATIVE
Protein, ur: 30 mg/dL — AB
SPECIFIC GRAVITY, URINE: 1.02 (ref 1.005–1.030)
pH: 6 (ref 5.0–8.0)

## 2017-10-20 LAB — BASIC METABOLIC PANEL
ANION GAP: 9 (ref 5–15)
BUN: 38 mg/dL — ABNORMAL HIGH (ref 6–20)
CHLORIDE: 98 mmol/L — AB (ref 101–111)
CO2: 26 mmol/L (ref 22–32)
Calcium: 7.5 mg/dL — ABNORMAL LOW (ref 8.9–10.3)
Creatinine, Ser: 1.08 mg/dL — ABNORMAL HIGH (ref 0.44–1.00)
GFR, EST AFRICAN AMERICAN: 55 mL/min — AB (ref >60)
GFR, EST NON AFRICAN AMERICAN: 48 mL/min — AB (ref >60)
Glucose, Bld: 122 mg/dL — ABNORMAL HIGH (ref 65–99)
Potassium: 3.4 mmol/L — ABNORMAL LOW (ref 3.5–5.1)
SODIUM: 133 mmol/L — AB (ref 135–145)

## 2017-10-20 LAB — CBC
HCT: 27.3 % — ABNORMAL LOW (ref 36.0–46.0)
HEMOGLOBIN: 8.7 g/dL — AB (ref 12.0–15.0)
MCH: 30.7 pg (ref 26.0–34.0)
MCHC: 31.9 g/dL (ref 30.0–36.0)
MCV: 96.5 fL (ref 78.0–100.0)
Platelets: 238 10*3/uL (ref 150–400)
RBC: 2.83 MIL/uL — ABNORMAL LOW (ref 3.87–5.11)
RDW: 15.2 % (ref 11.5–15.5)
WBC: 16 10*3/uL — ABNORMAL HIGH (ref 4.0–10.5)

## 2017-10-20 LAB — EXPECTORATED SPUTUM ASSESSMENT W REFEX TO RESP CULTURE

## 2017-10-20 LAB — HEMOGLOBIN AND HEMATOCRIT, BLOOD
HCT: 26.9 % — ABNORMAL LOW (ref 36.0–46.0)
HEMATOCRIT: 26.6 % — AB (ref 36.0–46.0)
HEMATOCRIT: 30.3 % — AB (ref 36.0–46.0)
HEMOGLOBIN: 8.6 g/dL — AB (ref 12.0–15.0)
HEMOGLOBIN: 9.8 g/dL — AB (ref 12.0–15.0)
Hemoglobin: 8.5 g/dL — ABNORMAL LOW (ref 12.0–15.0)

## 2017-10-20 LAB — MAGNESIUM: MAGNESIUM: 1.9 mg/dL (ref 1.7–2.4)

## 2017-10-20 LAB — EXPECTORATED SPUTUM ASSESSMENT W GRAM STAIN, RFLX TO RESP C

## 2017-10-21 ENCOUNTER — Other Ambulatory Visit (HOSPITAL_COMMUNITY): Payer: Self-pay

## 2017-10-21 DIAGNOSIS — R918 Other nonspecific abnormal finding of lung field: Secondary | ICD-10-CM | POA: Diagnosis not present

## 2017-10-21 LAB — BASIC METABOLIC PANEL
Anion gap: 10 (ref 5–15)
BUN: 30 mg/dL — AB (ref 6–20)
CALCIUM: 7.7 mg/dL — AB (ref 8.9–10.3)
CO2: 25 mmol/L (ref 22–32)
CREATININE: 0.82 mg/dL (ref 0.44–1.00)
Chloride: 99 mmol/L — ABNORMAL LOW (ref 101–111)
GFR calc non Af Amer: >60 mL/min (ref >60)
Glucose, Bld: 88 mg/dL (ref 65–99)
Potassium: 4.3 mmol/L (ref 3.5–5.1)
SODIUM: 134 mmol/L — AB (ref 135–145)

## 2017-10-21 LAB — HEMOGLOBIN AND HEMATOCRIT, BLOOD
HCT: 30.9 % — ABNORMAL LOW (ref 36.0–46.0)
HEMATOCRIT: 31.1 % — AB (ref 36.0–46.0)
HEMOGLOBIN: 9.7 g/dL — AB (ref 12.0–15.0)
Hemoglobin: 9.9 g/dL — ABNORMAL LOW (ref 12.0–15.0)

## 2017-10-21 LAB — URINE CULTURE: CULTURE: NO GROWTH

## 2017-10-22 LAB — CULTURE, RESPIRATORY W GRAM STAIN

## 2017-10-22 LAB — CULTURE, RESPIRATORY: CULTURE: NORMAL

## 2017-10-22 LAB — CBC WITH DIFFERENTIAL/PLATELET
BASOS ABS: 0 10*3/uL (ref 0.0–0.1)
Basophils Relative: 0 %
EOS ABS: 0.3 10*3/uL (ref 0.0–0.7)
EOS PCT: 2 %
HCT: 29.9 % — ABNORMAL LOW (ref 36.0–46.0)
HEMOGLOBIN: 9.3 g/dL — AB (ref 12.0–15.0)
LYMPHS PCT: 9 %
Lymphs Abs: 1.2 10*3/uL (ref 0.7–4.0)
MCH: 30.3 pg (ref 26.0–34.0)
MCHC: 31.1 g/dL (ref 30.0–36.0)
MCV: 97.4 fL (ref 78.0–100.0)
Monocytes Absolute: 1 10*3/uL (ref 0.1–1.0)
Monocytes Relative: 7 %
NEUTROS PCT: 82 %
Neutro Abs: 11.2 10*3/uL (ref 1.7–7.7)
PLATELETS: 215 10*3/uL (ref 150–400)
RBC: 3.07 MIL/uL — AB (ref 3.87–5.11)
RDW: 15.3 % (ref 11.5–15.5)
WBC: 13.6 10*3/uL — AB (ref 4.0–10.5)

## 2017-10-22 LAB — BASIC METABOLIC PANEL
Anion gap: 9 (ref 5–15)
BUN: 33 mg/dL — AB (ref 6–20)
CALCIUM: 8 mg/dL — AB (ref 8.9–10.3)
CO2: 29 mmol/L (ref 22–32)
Chloride: 97 mmol/L — ABNORMAL LOW (ref 101–111)
Creatinine, Ser: 0.89 mg/dL (ref 0.44–1.00)
GFR calc Af Amer: >60 mL/min (ref >60)
GLUCOSE: 111 mg/dL — AB (ref 65–99)
Potassium: 3.9 mmol/L (ref 3.5–5.1)
SODIUM: 135 mmol/L (ref 135–145)

## 2017-10-23 LAB — CBC
HCT: 26.1 % — ABNORMAL LOW (ref 36.0–46.0)
HEMOGLOBIN: 8.2 g/dL — AB (ref 12.0–15.0)
MCH: 30.6 pg (ref 26.0–34.0)
MCHC: 31.4 g/dL (ref 30.0–36.0)
MCV: 97.4 fL (ref 78.0–100.0)
PLATELETS: 195 10*3/uL (ref 150–400)
RBC: 2.68 MIL/uL — ABNORMAL LOW (ref 3.87–5.11)
RDW: 15.2 % (ref 11.5–15.5)
WBC: 10.8 10*3/uL — ABNORMAL HIGH (ref 4.0–10.5)

## 2017-10-23 LAB — BASIC METABOLIC PANEL
Anion gap: 9 (ref 5–15)
BUN: 46 mg/dL — AB (ref 6–20)
CALCIUM: 8.1 mg/dL — AB (ref 8.9–10.3)
CO2: 31 mmol/L (ref 22–32)
Chloride: 95 mmol/L — ABNORMAL LOW (ref 101–111)
Creatinine, Ser: 1.07 mg/dL — ABNORMAL HIGH (ref 0.44–1.00)
GFR calc Af Amer: 56 mL/min — ABNORMAL LOW (ref >60)
GFR, EST NON AFRICAN AMERICAN: 48 mL/min — AB (ref >60)
GLUCOSE: 100 mg/dL — AB (ref 65–99)
Potassium: 3.4 mmol/L — ABNORMAL LOW (ref 3.5–5.1)
Sodium: 135 mmol/L (ref 135–145)

## 2017-10-23 LAB — MAGNESIUM: Magnesium: 1.7 mg/dL (ref 1.7–2.4)

## 2017-10-24 LAB — CBC
HCT: 29.4 % — ABNORMAL LOW (ref 36.0–46.0)
Hemoglobin: 9.2 g/dL — ABNORMAL LOW (ref 12.0–15.0)
MCH: 30.8 pg (ref 26.0–34.0)
MCHC: 31.3 g/dL (ref 30.0–36.0)
MCV: 98.3 fL (ref 78.0–100.0)
PLATELETS: 165 10*3/uL (ref 150–400)
RBC: 2.99 MIL/uL — ABNORMAL LOW (ref 3.87–5.11)
RDW: 14.9 % (ref 11.5–15.5)
WBC: 8.4 10*3/uL (ref 4.0–10.5)

## 2017-10-24 LAB — RENAL FUNCTION PANEL
Albumin: 1.4 g/dL — ABNORMAL LOW (ref 3.5–5.0)
Anion gap: 11 (ref 5–15)
BUN: 49 mg/dL — ABNORMAL HIGH (ref 6–20)
CO2: 32 mmol/L (ref 22–32)
CREATININE: 0.99 mg/dL (ref 0.44–1.00)
Calcium: 8.3 mg/dL — ABNORMAL LOW (ref 8.9–10.3)
Chloride: 94 mmol/L — ABNORMAL LOW (ref 101–111)
GFR calc non Af Amer: 53 mL/min — ABNORMAL LOW (ref >60)
GLUCOSE: 98 mg/dL (ref 65–99)
Phosphorus: 2.2 mg/dL — ABNORMAL LOW (ref 2.5–4.6)
Potassium: 4.6 mmol/L (ref 3.5–5.1)
Sodium: 137 mmol/L (ref 135–145)

## 2017-10-24 LAB — MAGNESIUM: Magnesium: 1.9 mg/dL (ref 1.7–2.4)

## 2017-10-24 LAB — PHOSPHORUS: Phosphorus: 2.2 mg/dL — ABNORMAL LOW (ref 2.5–4.6)

## 2017-10-26 ENCOUNTER — Encounter: Payer: Self-pay | Admitting: Internal Medicine

## 2017-10-26 ENCOUNTER — Other Ambulatory Visit (HOSPITAL_COMMUNITY): Payer: Self-pay

## 2017-10-26 DIAGNOSIS — I482 Chronic atrial fibrillation, unspecified: Secondary | ICD-10-CM

## 2017-10-26 DIAGNOSIS — I5033 Acute on chronic diastolic (congestive) heart failure: Secondary | ICD-10-CM

## 2017-10-26 DIAGNOSIS — J189 Pneumonia, unspecified organism: Secondary | ICD-10-CM

## 2017-10-26 DIAGNOSIS — J9621 Acute and chronic respiratory failure with hypoxia: Secondary | ICD-10-CM

## 2017-10-26 DIAGNOSIS — J90 Pleural effusion, not elsewhere classified: Secondary | ICD-10-CM

## 2017-10-26 DIAGNOSIS — R918 Other nonspecific abnormal finding of lung field: Secondary | ICD-10-CM | POA: Diagnosis not present

## 2017-10-26 DIAGNOSIS — G894 Chronic pain syndrome: Secondary | ICD-10-CM

## 2017-10-26 HISTORY — DX: Chronic atrial fibrillation, unspecified: I48.20

## 2017-10-26 HISTORY — DX: Pleural effusion, not elsewhere classified: J90

## 2017-10-26 HISTORY — DX: Chronic pain syndrome: G89.4

## 2017-10-26 HISTORY — DX: Pneumonia, unspecified organism: J18.9

## 2017-10-26 LAB — RENAL FUNCTION PANEL
ANION GAP: 11 (ref 5–15)
Albumin: 1.4 g/dL — ABNORMAL LOW (ref 3.5–5.0)
BUN: 69 mg/dL — AB (ref 6–20)
CO2: 34 mmol/L — ABNORMAL HIGH (ref 22–32)
Calcium: 8.4 mg/dL — ABNORMAL LOW (ref 8.9–10.3)
Chloride: 91 mmol/L — ABNORMAL LOW (ref 101–111)
Creatinine, Ser: 1.29 mg/dL — ABNORMAL HIGH (ref 0.44–1.00)
GFR, EST AFRICAN AMERICAN: 45 mL/min — AB (ref >60)
GFR, EST NON AFRICAN AMERICAN: 39 mL/min — AB (ref >60)
Glucose, Bld: 111 mg/dL — ABNORMAL HIGH (ref 65–99)
PHOSPHORUS: 3.4 mg/dL (ref 2.5–4.6)
POTASSIUM: 4.1 mmol/L (ref 3.5–5.1)
Sodium: 136 mmol/L (ref 135–145)

## 2017-10-26 LAB — BLOOD GAS, ARTERIAL
ACID-BASE EXCESS: 10.3 mmol/L — AB (ref 0.0–2.0)
Bicarbonate: 34.4 mmol/L — ABNORMAL HIGH (ref 20.0–28.0)
DRAWN BY: 331761
FIO2: 100
O2 SAT: 94.2 %
PCO2 ART: 47.1 mmHg (ref 32.0–48.0)
Patient temperature: 98.6
pH, Arterial: 7.477 — ABNORMAL HIGH (ref 7.350–7.450)
pO2, Arterial: 67.1 mmHg — ABNORMAL LOW (ref 83.0–108.0)

## 2017-10-26 LAB — CBC
HEMATOCRIT: 28.1 % — AB (ref 36.0–46.0)
HEMOGLOBIN: 8.7 g/dL — AB (ref 12.0–15.0)
MCH: 30.2 pg (ref 26.0–34.0)
MCHC: 31 g/dL (ref 30.0–36.0)
MCV: 97.6 fL (ref 78.0–100.0)
Platelets: 165 10*3/uL (ref 150–400)
RBC: 2.88 MIL/uL — ABNORMAL LOW (ref 3.87–5.11)
RDW: 14.7 % (ref 11.5–15.5)
WBC: 11.2 10*3/uL — ABNORMAL HIGH (ref 4.0–10.5)

## 2017-10-26 LAB — MAGNESIUM: Magnesium: 2.3 mg/dL (ref 1.7–2.4)

## 2017-10-26 LAB — T4, FREE: Free T4: 1.39 ng/dL (ref 0.82–1.77)

## 2017-10-26 LAB — TSH: TSH: 0.917 u[IU]/mL (ref 0.350–4.500)

## 2017-10-26 NOTE — Progress Notes (Signed)
Pulmonary Critical Care Medicine [facility] PULMONARY SERVICE  Date of Service: 10/26/2017  PULMONARY CONSULT   SARITA HAKANSON  YEM:[id number]  DOB: 1941-05-16   DOA: 10/20/2017  Referring Physician: Carron Curie, MD  HPI: Tonya Mcgee is a 77 y.o. female seen for follow up of Acute on Chronic Respiratory Failure.  Patient was transferred to our facility for further management and weaning.  Basically had presented with respiratory distress to the transferring facility with increasing shortness of breath orthopnea.  Patient was having more difficulty in being less this responsive.  When she was seen by EMS she was placed on BiPAP and brought to the ICU.  Chest x-ray revealed infiltrate consistent with pneumonia so patient was diagnosed with H. influenzae pneumonia and bacteremia.  Patient was treated with IV azithromycin and Azactam.  At the time patient had declined intubation throughout the entire hospitalization.  Patient was transferred for further management also was diagnosed with diastolic heart failure.  At this time she has a chest x-ray consistent with bilateral infiltrates and left-sided pleural effusion.  The concern here is that if she has a pneumonia which is unresolving she may need diagnostic bronchoscopy for cultures.  The problem is that the patient is currently on 100% high flow oxygen through a heated high flow device.  I spoke with the husband and the husband would like to have everything done at this point including and up to intubation however he is going to discuss it with his family before he gives [place] the permission to intubate her.  The other issue also is that the effusion may be hindering her respiratory status but I think more significant is the pulmonary infiltrates that are noted on the chest x-ray  Review of Systems:  ROS performed and is unremarkable other than noted above.  Past Medical History:  Diagnosis Date  . Anemia    Suspect this is anemia  of chronic disease.  Takes once daily iron as of 09/2017  . Atrial fibrillation, chronic (HCC) 10/26/2017  . Bilateral pneumonia 10/26/2017  . Chronic pain syndrome 10/26/2017  . History of chicken pox   . History of measles   . History of mumps   . Hypertension   . Pleural effusion 10/26/2017  . Rheumatoid arthritis (HCC)    takes Plaquenil as of 09/2017    Past Surgical History:  Procedure Laterality Date  . APPENDECTOMY  1960  . Bone Density Study  2011   within normal limits per patient report  . Carotid doppler ultrasound  11/18/2013   mild bilateral plaque formation. No hemodynamically significant stenosis  . CARPAL TUNNEL RELEASE Right 1990  . Colonoscopy Screening  2011   normal per patient report  . KNEE SURGERY Left 1989   arthroscopic  . mammogram screening  2012   normal per patient  . pap smear  2012   normal per patient  . TONSILLECTOMY  1946    Social History:    reports that she quit smoking about 39 years ago. Her smoking use included cigarettes. She has a 7.50 pack-year smoking history. She has never used smokeless tobacco. She reports that she drinks alcohol. She reports that she does not use drugs.  Family History: Non-Contributory to the present illness  Allergies  Allergen Reactions  . Iodine Hives    Tolerated amiodarone 09/2017 ANTISEPTICS & DISINFECTANTS Throat swelling  . Sulfa Antibiotics     Vomiting and diarrhea  . Amoxicillin Hives  . Ciprofloxacin Hives  .  Codeine Other (See Comments) and Hives    Weird dreams  . Keflex  [Cephalexin]   . Other Other (See Comments)    SKIN RED IRRITATED  . Penicillins Hives  . Pravastatin Nausea Only  . Tape     skin red irritated    Medications: Reviewed on Rounds  Physical Exam:  Vitals: Temperature 98.0 pulse 89 respiratory rate 22 blood pressure 115/56 saturations 94%  Ventilator Settings off of the ventilator on heated high flow 90% FiO2  . General: Comfortable at this time . Eyes:  Grossly normal lids, irises & conjunctiva . ENT: grossly tongue is normal . Neck: no obvious mass . Cardiovascular: S1-S2 normal irregular rhythm no gallop or rub . Respiratory: Coarse breath sounds noted bilaterally . Abdomen: Soft nontender . Skin: no rash seen on limited exam . Musculoskeletal: not rigid . Psychiatric:unable to assess . Neurologic: no seizure no involuntary movements         Labs on Admission:  Basic Metabolic Panel: Recent Labs  Lab 10/20/17 0721 10/21/17 0651 10/22/17 0941 10/23/17 0656 10/24/17 1030 10/26/17 0734  NA 133* 134* 135 135 137 136  K 3.4* 4.3 3.9 3.4* 4.6 4.1  CL 98* 99* 97* 95* 94* 91*  CO2 26 25 29 31  32 34*  GLUCOSE 122* 88 111* 100* 98 111*  BUN 38* 30* 33* 46* 49* 69*  CREATININE 1.08* 0.82 0.89 1.07* 0.99 1.29*  CALCIUM 7.5* 7.7* 8.0* 8.1* 8.3* 8.4*  MG 1.9  --   --  1.7 1.9 2.3  PHOS  --   --   --   --  2.2*  2.2* 3.4    Liver Function Tests: Recent Labs  Lab 10/24/17 1030 10/26/17 0734  ALBUMIN 1.4* 1.4*   No results for input(s): LIPASE, AMYLASE in the last 168 hours. No results for input(s): AMMONIA in the last 168 hours.  CBC: Recent Labs  Lab 10/20/17 0721  10/21/17 1026 10/22/17 0941 10/23/17 0656 10/24/17 1030 10/26/17 0734  WBC 16.0*  --   --  13.6* 10.8* 8.4 11.2*  NEUTROABS  --   --   --  11.2  --   --   --   HGB 8.7*   < > 9.9* 9.3* 8.2* 9.2* 8.7*  HCT 27.3*   < > 31.1* 29.9* 26.1* 29.4* 28.1*  MCV 96.5  --   --  97.4 97.4 98.3 97.6  PLT 238  --   --  215 195 165 165   < > = values in this interval not displayed.    Cardiac Enzymes: No results for input(s): CKTOTAL, CKMB, CKMBINDEX, TROPONINI in the last 168 hours.  BNP (last 3 results) Recent Labs    02/07/17 1159 10/04/17 0954 10/06/17 0655  BNP 169.1* 417.6* 986.7*    ProBNP (last 3 results) No results for input(s): PROBNP in the last 8760 hours.   Radiological Exams on Admission: Dg Chest Port 1 View  Result Date:  10/26/2017 CLINICAL DATA:  Oxygen desaturation. EXAM: PORTABLE CHEST 1 VIEW COMPARISON:  Radiograph of Oct 21, 2017. FINDINGS: Stable cardiomediastinal silhouette. Nasogastric tube is seen entering stomach. No pneumothorax is noted. Stable bilateral airspace and interstitial densities are noted throughout both lungs concerning for pneumonia or possibly edema. Minimal left pleural effusion may be present. Bony thorax is unremarkable. IMPRESSION: Stable bilateral lung opacities are noted concerning for pneumonia or possibly edema. Electronically Signed   By: Lupita Raider, M.D.   On: 10/26/2017 10:25    Assessment/Plan Principal  Problem:   Acute on chronic respiratory failure with hypoxia (HCC) Active Problems:   Acute on chronic diastolic CHF (congestive heart failure) (HCC)   Bilateral pneumonia   Atrial fibrillation, chronic (HCC)   Chronic pain syndrome   Pleural effusion   1. Acute on chronic respiratory failure with hypoxia as discussed above patient has significant worsening of her chest film compared to her oldest film which I was able to find was 2018 she has left-sided effusion along with infiltrates bilaterally.  I think she needs a diagnostic bronchoscopy cultures and this has been discussed with the husband at this time he is not wanting to do that until he speaks with rest of his family. 2. Bilateral pneumonia she was previously treated with antibiotics at the transferring facility right now she has worsening infiltrate I think that we need diagnostic cultures to properly place her on antibiotics.  Right now she is on empiric therapy. 3. Chronic atrial fibrillation rate is controlled at this time would continue to follow 4. Pleural effusion would consider doing a thoracentesis on the left side this may help with the diagnostic process and also reduce her pulmonary symptoms 5. Chronic pain syndrome we will continue with pain management but need to be careful not to over sedate  her 6. Acute on chronic diastolic heart failure echocardiogram results were noted patient does have diastolic dysfunction we will keep her on the dry side diurese as tolerated  I have personally seen and evaluated the patient, evaluated laboratory and imaging results, formulated the assessment and plan and placed orders. The Patient requires high complexity decision making for assessment and support.  Case was discussed on Rounds with the Respiratory Therapy Staff Patient is critically ill and in danger of cardiac arrest time spent 35 minutes  Yevonne Pax, MD [facility] Pulmonary Critical Care Medicine Sleep Medicine

## 2017-10-27 ENCOUNTER — Encounter (HOSPITAL_COMMUNITY): Payer: Self-pay | Admitting: Certified Registered"

## 2017-10-27 ENCOUNTER — Other Ambulatory Visit (HOSPITAL_COMMUNITY): Payer: Self-pay

## 2017-10-27 ENCOUNTER — Institutional Professional Consult (permissible substitution) (HOSPITAL_COMMUNITY): Payer: Self-pay | Admitting: Certified Registered"

## 2017-10-27 DIAGNOSIS — Z4682 Encounter for fitting and adjustment of non-vascular catheter: Secondary | ICD-10-CM | POA: Diagnosis not present

## 2017-10-27 LAB — BLOOD GAS, ARTERIAL
ACID-BASE EXCESS: 8 mmol/L — AB (ref 0.0–2.0)
BICARBONATE: 32.9 mmol/L — AB (ref 20.0–28.0)
FIO2: 70
LHR: 16 {breaths}/min
MECHVT: 450 mL
O2 SAT: 94.7 %
PEEP/CPAP: 5 cmH2O
PH ART: 7.395 (ref 7.350–7.450)
Patient temperature: 98.6
pCO2 arterial: 55 mmHg — ABNORMAL HIGH (ref 32.0–48.0)
pO2, Arterial: 75.7 mmHg — ABNORMAL LOW (ref 83.0–108.0)

## 2017-10-27 LAB — BASIC METABOLIC PANEL
ANION GAP: 12 (ref 5–15)
BUN: 98 mg/dL — ABNORMAL HIGH (ref 6–20)
CALCIUM: 8.3 mg/dL — AB (ref 8.9–10.3)
CO2: 33 mmol/L — ABNORMAL HIGH (ref 22–32)
Chloride: 91 mmol/L — ABNORMAL LOW (ref 101–111)
Creatinine, Ser: 1.85 mg/dL — ABNORMAL HIGH (ref 0.44–1.00)
GFR calc Af Amer: 29 mL/min — ABNORMAL LOW (ref >60)
GFR, EST NON AFRICAN AMERICAN: 25 mL/min — AB (ref >60)
GLUCOSE: 112 mg/dL — AB (ref 65–99)
POTASSIUM: 4.1 mmol/L (ref 3.5–5.1)
SODIUM: 136 mmol/L (ref 135–145)

## 2017-10-27 MED ORDER — PROPOFOL 10 MG/ML IV BOLUS
INTRAVENOUS | Status: DC | PRN
  Administered 2017-10-27: 100 mg via INTRAVENOUS

## 2017-10-27 MED ORDER — SUCCINYLCHOLINE CHLORIDE 20 MG/ML IJ SOLN
INTRAMUSCULAR | Status: DC | PRN
  Administered 2017-10-27: 100 mg via INTRAVENOUS

## 2017-10-27 NOTE — Progress Notes (Signed)
Pulmonary Critical Care Medicine [facility] [place]   PULMONARY SERVICE  PROGRESS NOTE  Date of Service: 10/27/2017  Tonya Mcgee  MHD:[id number]  DOB: 02-Dec-1940   DOA: 11/02/2017  Referring Physician: Carron Curie, MD  HPI: Tonya Mcgee is a 77 y.o. female seen for follow up of Acute on Chronic Respiratory Failure.  Patient's family husband and son were present at the bedside.  Had a very lengthy conversation with them as well as the patient.  She remains critically ill in danger of cardiac arrest the patient at this time now wants to be intubated she wants to have a bronchoscopy done however she does not want any CPR done in case of cardiac arrest.  In addition she was very clear that she does not want to be on the ventilator for more than 10 days if she is not able to come off of the machine after the intubation is done.  Understands that this could potentially lead to her death and she is accepting of what are the consequences may be  Medications: Reviewed on Rounds  Physical Exam:  Vitals: Temperature 98.3 pulse 80 respiratory rate 29 blood pressure 114/60 saturations 97%  Ventilator Settings 90% FiO2 heated high flow 40 L/min  . General: Comfortable at this time . Eyes: Grossly normal lids, irises & conjunctiva . ENT: grossly tongue is normal . Neck: no obvious mass . Cardiovascular: S1-S2 normal no gallop or rub . Respiratory: Coarse breath sounds bilaterally . Abdomen: Soft and nontender . Skin: no rash seen on limited exam . Musculoskeletal: not rigid . Psychiatric:unable to assess . Neurologic: no seizure no involuntary movements         Labs on Admission:  Basic Metabolic Panel: Recent Labs  Lab 10/22/17 0941 10/23/17 0656 10/24/17 1030 10/26/17 0734 10/27/17 0655  NA 135 135 137 136 136  K 3.9 3.4* 4.6 4.1 4.1  CL 97* 95* 94* 91* 91*  CO2 29 31 32 34* 33*  GLUCOSE 111* 100* 98 111* 112*  BUN 33* 46* 49* 69* 98*  CREATININE 0.89 1.07*  0.99 1.29* 1.85*  CALCIUM 8.0* 8.1* 8.3* 8.4* 8.3*  MG  --  1.7 1.9 2.3  --   PHOS  --   --  2.2*  2.2* 3.4  --     Liver Function Tests: Recent Labs  Lab 10/24/17 1030 10/26/17 0734  ALBUMIN 1.4* 1.4*   No results for input(s): LIPASE, AMYLASE in the last 168 hours. No results for input(s): AMMONIA in the last 168 hours.  CBC: Recent Labs  Lab 10/21/17 1026 10/22/17 0941 10/23/17 0656 10/24/17 1030 10/26/17 0734  WBC  --  13.6* 10.8* 8.4 11.2*  NEUTROABS  --  11.2  --   --   --   HGB 9.9* 9.3* 8.2* 9.2* 8.7*  HCT 31.1* 29.9* 26.1* 29.4* 28.1*  MCV  --  97.4 97.4 98.3 97.6  PLT  --  215 195 165 165    Cardiac Enzymes: No results for input(s): CKTOTAL, CKMB, CKMBINDEX, TROPONINI in the last 168 hours.  BNP (last 3 results) Recent Labs    02/07/17 1159 10/04/17 0954 10/06/17 0655  BNP 169.1* 417.6* 986.7*    ProBNP (last 3 results) No results for input(s): PROBNP in the last 8760 hours.  Radiological Exams on Admission: Dg Chest Port 1 View  Result Date: 10/26/2017 CLINICAL DATA:  Oxygen desaturation. EXAM: PORTABLE CHEST 1 VIEW COMPARISON:  Radiograph of Oct 21, 2017. FINDINGS: Stable cardiomediastinal silhouette. Nasogastric tube  is seen entering stomach. No pneumothorax is noted. Stable bilateral airspace and interstitial densities are noted throughout both lungs concerning for pneumonia or possibly edema. Minimal left pleural effusion may be present. Bony thorax is unremarkable. IMPRESSION: Stable bilateral lung opacities are noted concerning for pneumonia or possibly edema. Electronically Signed   By: Lupita Raider, M.D.   On: 10/26/2017 10:25    Assessment/Plan Principal Problem:   Acute on chronic respiratory failure with hypoxia (HCC) Active Problems:   Acute on chronic diastolic CHF (congestive heart failure) (HCC)   Bilateral pneumonia   Atrial fibrillation, chronic (HCC)   Chronic pain syndrome   Pleural effusion   1. Acute on chronic  respiratory failure with hypoxia patient is going to be intubated today we will let her rest over the weekend and possibly do a bronchoscopy on Sunday if she is stable to collect samples for cultures in the lab.  We will also get a CT scan of her chest to evaluate the pulmonary vasculature as well as the parenchyma.  This will be better done once she is intubated and stabilized. 2. Acute on chronic diastolic heart failure we will continue with supportive care 3. Bilateral pneumonia as above we will going to do a bronchoscopy. 4. Chronic pain syndrome at baseline 5. Pleural effusion CT once that is done 6. Chronic atrial fibrillation rate is controlled   I have personally seen and evaluated the patient, evaluated laboratory and imaging results, formulated the assessment and plan and placed orders. The Patient requires high complexity decision making for assessment and support.  Case was discussed on Rounds with the Respiratory Therapy Staff patient is critically ill and in danger of cardiac arrest.  Time spent 35 minutes  Yevonne Pax, MD [facility] Pulmonary Critical Care Medicine Sleep Medicine

## 2017-10-27 NOTE — Anesthesia Procedure Notes (Signed)
Procedure Name: Intubation Date/Time: 10/27/2017 1:37 PM Performed by: Myna Bright, CRNA Pre-anesthesia Checklist: Patient identified, Emergency Drugs available, Suction available and Patient being monitored Patient Re-evaluated:Patient Re-evaluated prior to induction Oxygen Delivery Method: Circle system utilized Preoxygenation: Pre-oxygenation with 100% oxygen Induction Type: IV induction Ventilation: Mask ventilation without difficulty Laryngoscope Size: Mac and 3 Grade View: Grade I Tube type: Subglottic suction tube Tube size: 7.5 mm Number of attempts: 1 Airway Equipment and Method: Stylet Placement Confirmation: ETT inserted through vocal cords under direct vision,  positive ETCO2 and breath sounds checked- equal and bilateral Secured at: 21 cm Tube secured with: Tape Dental Injury: Teeth and Oropharynx as per pre-operative assessment

## 2017-10-28 ENCOUNTER — Other Ambulatory Visit (HOSPITAL_COMMUNITY): Payer: Self-pay

## 2017-10-28 LAB — BLOOD GAS, ARTERIAL
Acid-base deficit: 7.1 mmol/L — ABNORMAL HIGH (ref 0.0–2.0)
BICARBONATE: 20.2 mmol/L (ref 20.0–28.0)
Drawn by: 23703
FIO2: 100
LHR: 16 {breaths}/min
MECHVT: 450 mL
O2 Saturation: 99 %
PATIENT TEMPERATURE: 100.6
PEEP/CPAP: 5 cmH2O
PO2 ART: 273 mmHg — AB (ref 83.0–108.0)
pCO2 arterial: 62.5 mmHg — ABNORMAL HIGH (ref 32.0–48.0)
pH, Arterial: 7.145 — CL (ref 7.350–7.450)

## 2017-10-28 LAB — RENAL FUNCTION PANEL
Albumin: 1.3 g/dL — ABNORMAL LOW (ref 3.5–5.0)
Anion gap: 13 (ref 5–15)
BUN: 116 mg/dL — AB (ref 6–20)
CALCIUM: 7.9 mg/dL — AB (ref 8.9–10.3)
CO2: 28 mmol/L (ref 22–32)
CREATININE: 2.24 mg/dL — AB (ref 0.44–1.00)
Chloride: 98 mmol/L — ABNORMAL LOW (ref 101–111)
GFR calc Af Amer: 23 mL/min — ABNORMAL LOW (ref >60)
GFR calc non Af Amer: 20 mL/min — ABNORMAL LOW (ref >60)
GLUCOSE: 102 mg/dL — AB (ref 65–99)
Phosphorus: 5.8 mg/dL — ABNORMAL HIGH (ref 2.5–4.6)
Potassium: 4.9 mmol/L (ref 3.5–5.1)
SODIUM: 139 mmol/L (ref 135–145)

## 2017-10-28 LAB — CBC
HCT: 25.3 % — ABNORMAL LOW (ref 36.0–46.0)
HEMOGLOBIN: 7.7 g/dL — AB (ref 12.0–15.0)
MCH: 30.4 pg (ref 26.0–34.0)
MCHC: 30.4 g/dL (ref 30.0–36.0)
MCV: 100 fL (ref 78.0–100.0)
PLATELETS: 173 10*3/uL (ref 150–400)
RBC: 2.53 MIL/uL — AB (ref 3.87–5.11)
RDW: 15.2 % (ref 11.5–15.5)
WBC: 12.6 10*3/uL — AB (ref 4.0–10.5)

## 2017-10-28 LAB — MAGNESIUM: MAGNESIUM: 2.5 mg/dL — AB (ref 1.7–2.4)

## 2017-10-30 ENCOUNTER — Telehealth: Payer: Self-pay | Admitting: Internal Medicine

## 2017-10-30 NOTE — Telephone Encounter (Signed)
Pt's daughter in-law called to let us know she has passed away.

## 2017-11-01 MED FILL — Medication: Qty: 1 | Status: AC

## 2017-11-11 NOTE — Progress Notes (Addendum)
Pulmonary Critical Care Medicine [facility] [place]   PULMONARY SERVICE  PROGRESS NOTE  Date of Service: 10/15/2017  Tonya Mcgee  DQQ:[id number]  DOB: 01-04-1941   DOA: 2017/11/05  Referring Physician: Carron Curie, MD  HPI: Tonya Mcgee is a 77 y.o. female seen for follow up of Acute on Chronic Respiratory Failure.  Patient is on full vent support she is been requiring 70% FiO2.  She is orally intubated after lengthy discussion yesterday with the family.  She remains critically ill and is still in danger of cardiac arrest.  Patient is currently unresponsive  Medications: Reviewed on Rounds  Physical Exam:  Vitals: Temperature 99.1 pulse 85 respiratory rate 24 blood pressure 119/56 saturations 95%  Ventilator Settings mode of ventilation assist control FiO2 70% PEEP 5  . General: Comfortable at this time . Eyes: Grossly normal lids, irises & conjunctiva . ENT: grossly tongue is normal . Neck: no obvious mass . Cardiovascular: S1 S2 normal no gallop . Respiratory: Coarse rhonchi expansion is equal . Abdomen: soft . Skin: no rash seen on limited exam . Musculoskeletal: not rigid . Psychiatric:unable to assess . Neurologic: no seizure no involuntary movements         Labs on Admission:  Basic Metabolic Panel: Recent Labs  Lab 10/23/17 0656 10/24/17 1030 10/26/17 0734 10/27/17 0655 11/01/2017 0645  NA 135 137 136 136 139  K 3.4* 4.6 4.1 4.1 4.9  CL 95* 94* 91* 91* 98*  CO2 31 32 34* 33* 28  GLUCOSE 100* 98 111* 112* 102*  BUN 46* 49* 69* 98* 116*  CREATININE 1.07* 0.99 1.29* 1.85* 2.24*  CALCIUM 8.1* 8.3* 8.4* 8.3* 7.9*  MG 1.7 1.9 2.3  --  2.5*  PHOS  --  2.2*  2.2* 3.4  --  5.8*    Liver Function Tests: Recent Labs  Lab 10/24/17 1030 10/26/17 0734 11/05/2017 0645  ALBUMIN 1.4* 1.4* 1.3*   No results for input(s): LIPASE, AMYLASE in the last 168 hours. No results for input(s): AMMONIA in the last 168 hours.  CBC: Recent Labs  Lab  10/22/17 0941 10/23/17 0656 10/24/17 1030 10/26/17 0734 10/27/2017 0645  WBC 13.6* 10.8* 8.4 11.2* 12.6*  NEUTROABS 11.2  --   --   --   --   HGB 9.3* 8.2* 9.2* 8.7* 7.7*  HCT 29.9* 26.1* 29.4* 28.1* 25.3*  MCV 97.4 97.4 98.3 97.6 100.0  PLT 215 195 165 165 173    Cardiac Enzymes: No results for input(s): CKTOTAL, CKMB, CKMBINDEX, TROPONINI in the last 168 hours.  BNP (last 3 results) Recent Labs    02/07/17 1159 10/04/17 0954 10/06/17 0655  BNP 169.1* 417.6* 986.7*    ProBNP (last 3 results) No results for input(s): PROBNP in the last 8760 hours.  Radiological Exams on Admission: Dg Chest Port 1 View  Result Date: 10/27/2017 CLINICAL DATA:  Intubation. EXAM: PORTABLE CHEST 1 VIEW COMPARISON:  10/26/2017 FINDINGS: An endotracheal tube is identified with tip 2.5 cm above the carina. An NG tube is again identified entering the stomach with tip off the field of view. Bilateral interstitial and airspace opacities are unchanged. There is no evidence of pneumothorax. Lung volumes are similar. IMPRESSION: Endotracheal tube with tip 2.5 cm above the carina. Otherwise unchanged appearance of the chest. Electronically Signed   By: Harmon Pier M.D.   On: 10/27/2017 14:11   Dg Chest Port 1 View  Result Date: 10/26/2017 CLINICAL DATA:  Oxygen desaturation. EXAM: PORTABLE CHEST 1 VIEW COMPARISON:  Radiograph of Oct 21, 2017. FINDINGS: Stable cardiomediastinal silhouette. Nasogastric tube is seen entering stomach. No pneumothorax is noted. Stable bilateral airspace and interstitial densities are noted throughout both lungs concerning for pneumonia or possibly edema. Minimal left pleural effusion may be present. Bony thorax is unremarkable. IMPRESSION: Stable bilateral lung opacities are noted concerning for pneumonia or possibly edema. Electronically Signed   By: Lupita Raider, M.D.   On: 10/26/2017 10:25    Assessment/Plan Principal Problem:   Acute on chronic respiratory failure with  hypoxia (HCC) Active Problems:   Acute on chronic diastolic CHF (congestive heart failure) (HCC)   Bilateral pneumonia   Atrial fibrillation, chronic (HCC)   Chronic pain syndrome   Pleural effusion   1. Acute on chronic respiratory failure with hypoxia reviewed vent settings reviewed oxygen settings discussed with respiratory therapy on rounds.  Patient needs to have her FiO2 reduced possibly by increasing the PEEP we can accomplish this.  Last chest x-ray showed bilateral infiltrates.  Patient will hopefully be able to have a bronchoscopy done once her FiO2 requirements have improved. 2. Bilateral pneumonia being treated with antibiotics awaiting bronchoscopy for culture diagnosis. 3. Chronic atrial fibrillation rate is controlled we will continue with supportive care 4. Pleural effusion monitor x-rays 5. Chronic pain syndrome since he is comfortable at this time 6. Acute on chronic diastolic heart failure as mentioned some fluid overload noted on the x-ray we will continue with supportive care   I have personally seen and evaluated the patient, evaluated laboratory and imaging results, formulated the assessment and plan and placed orders. The Patient requires high complexity decision making for assessment and support.  Case was discussed on Rounds with the Respiratory Therapy Staff Patient is critically ill and in danger of cardiac arrest critical care time 35 minutes  Yevonne Pax, MD [facility] Pulmonary Critical Care Medicine Sleep Medicine

## 2017-11-11 DEATH — deceased

## 2019-02-02 IMAGING — DX DG CHEST 1V PORT
1 series · 1 of 1 positions shown · non-contrast
Comparison: Radiograph October 11, 2017.

CLINICAL DATA: Shortness of breath, respiratory failure.

EXAM:
PORTABLE CHEST 1 VIEW

[chest ap]
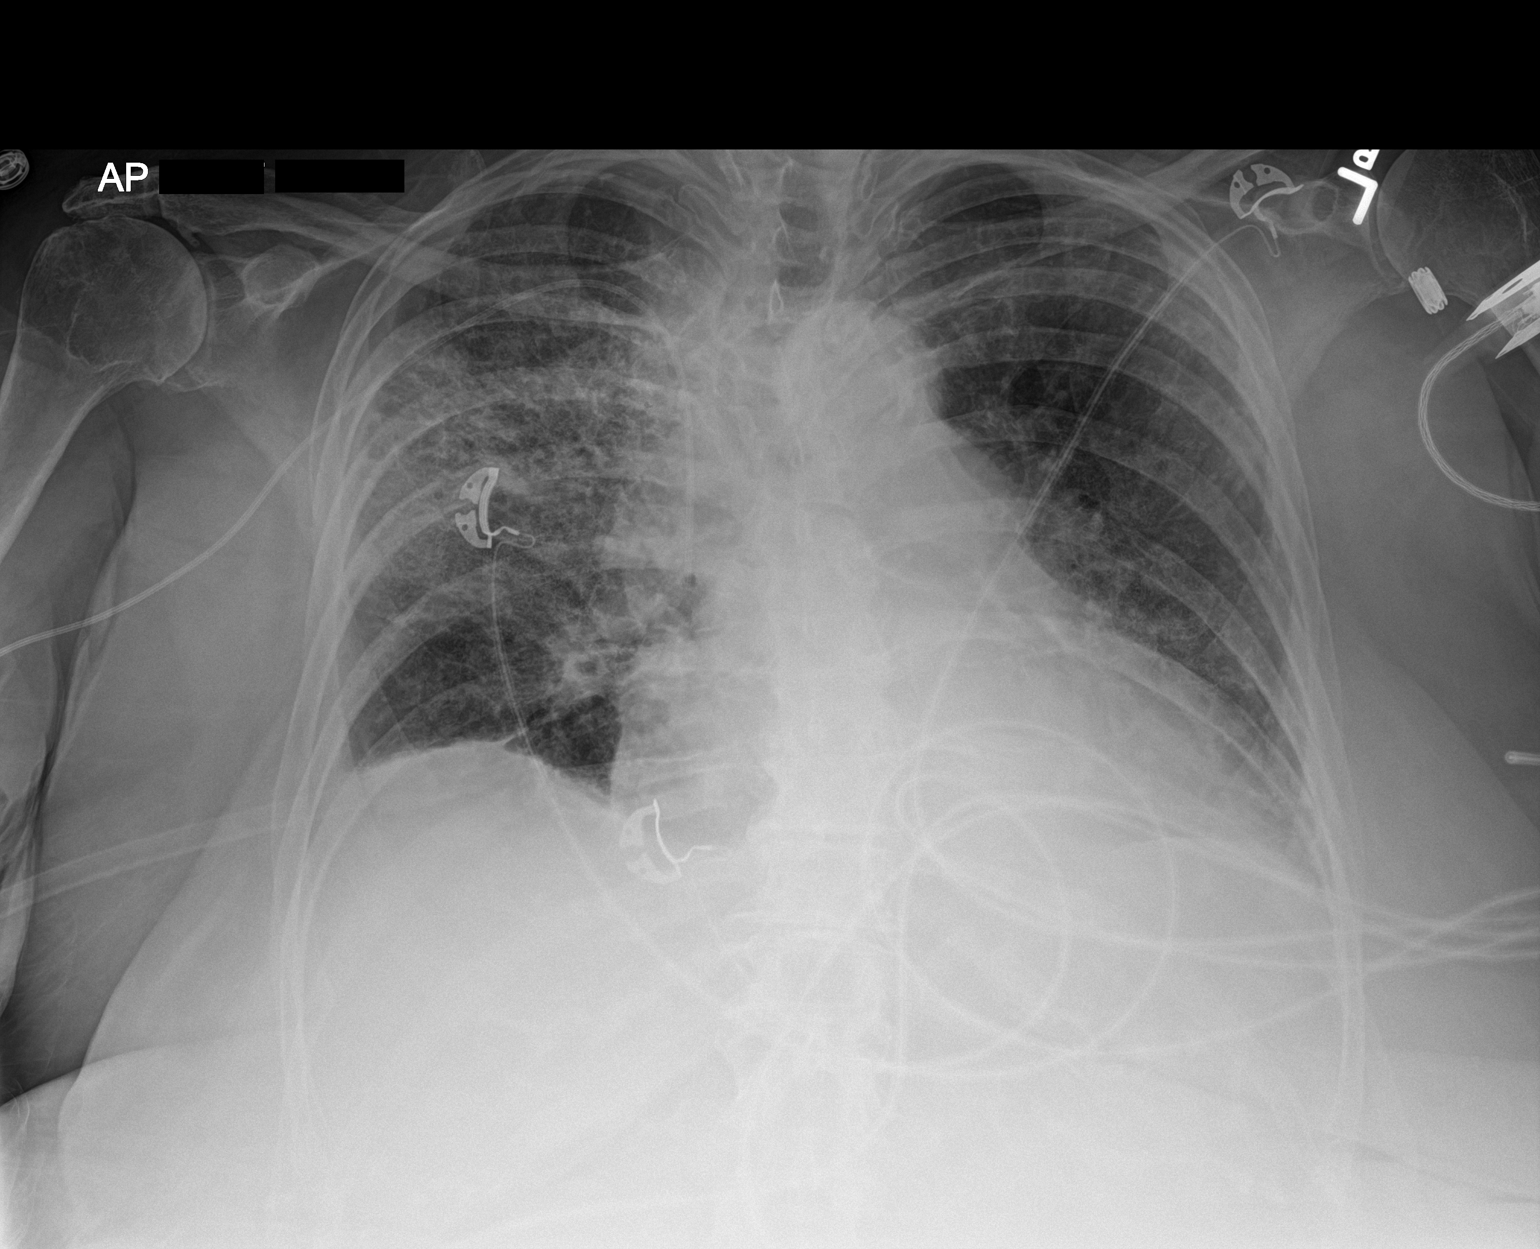

[1 of 1 positions shown; findings below may reference images not displayed]

FINDINGS: Stable cardiomediastinal silhouette. Right-sided PICC line is
unchanged in position. No pneumothorax is noted. Stable right upper
lobe opacity is noted concerning for pneumonia. Stable left basilar
opacity is noted concerning for atelectasis or infiltrate. Bony
thorax is unremarkable. No significant pleural effusion is noted.
IMPRESSION: Stable bilateral lung opacities as described above.

## 2019-02-07 IMAGING — DX DG CHEST 1V PORT
1 series · 1 of 1 positions shown · non-contrast
Comparison: 10/12/2017 chest x-ray.

CLINICAL DATA: 76-year-old female with shortness breath. Subsequent
encounter.

EXAM:
PORTABLE CHEST 1 VIEW

[chest ap]
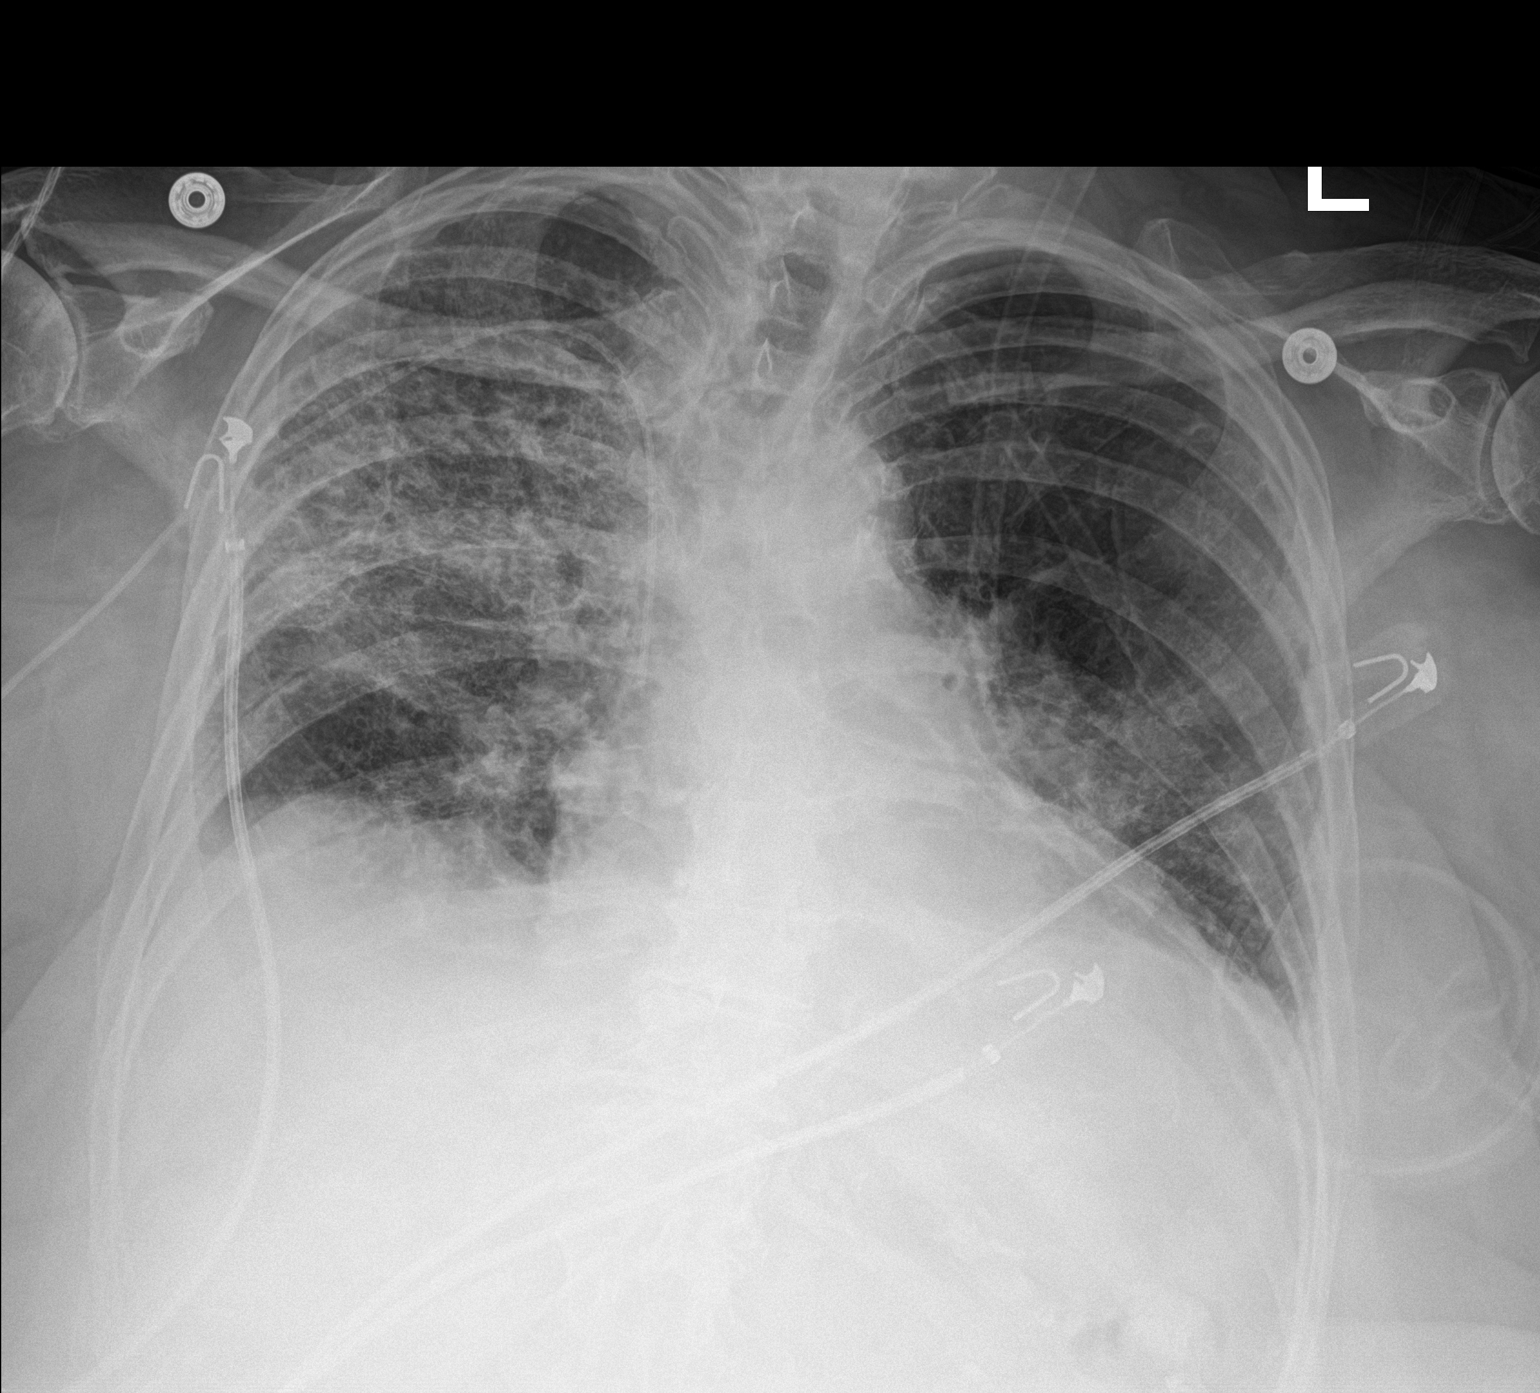

[1 of 1 positions shown; findings below may reference images not displayed]

FINDINGS: Right PICC line tip mid to distal superior vena cava.

Mild cardiomegaly.

Right upper lobe patchy infiltrates stable.

Central pulmonary vascular prominence.

Small right-sided pleural effusion suspected.

No pneumothorax.

Calcified aorta.
IMPRESSION: Heart size slightly decreased from prior examination.

Remainder of findings unchanged including right upper lobe
infiltrate and central pulmonary vascular prominence. Possible small
right-sided pleural effusion.

Aortic Atherosclerosis ([id number]).

## 2019-02-11 IMAGING — DX DG CHEST 1V PORT
1 series · 1 of 1 positions shown · non-contrast
Comparison: 10/17/2017

CLINICAL DATA: Desaturation

EXAM:
PORTABLE CHEST 1 VIEW

[chest ap]
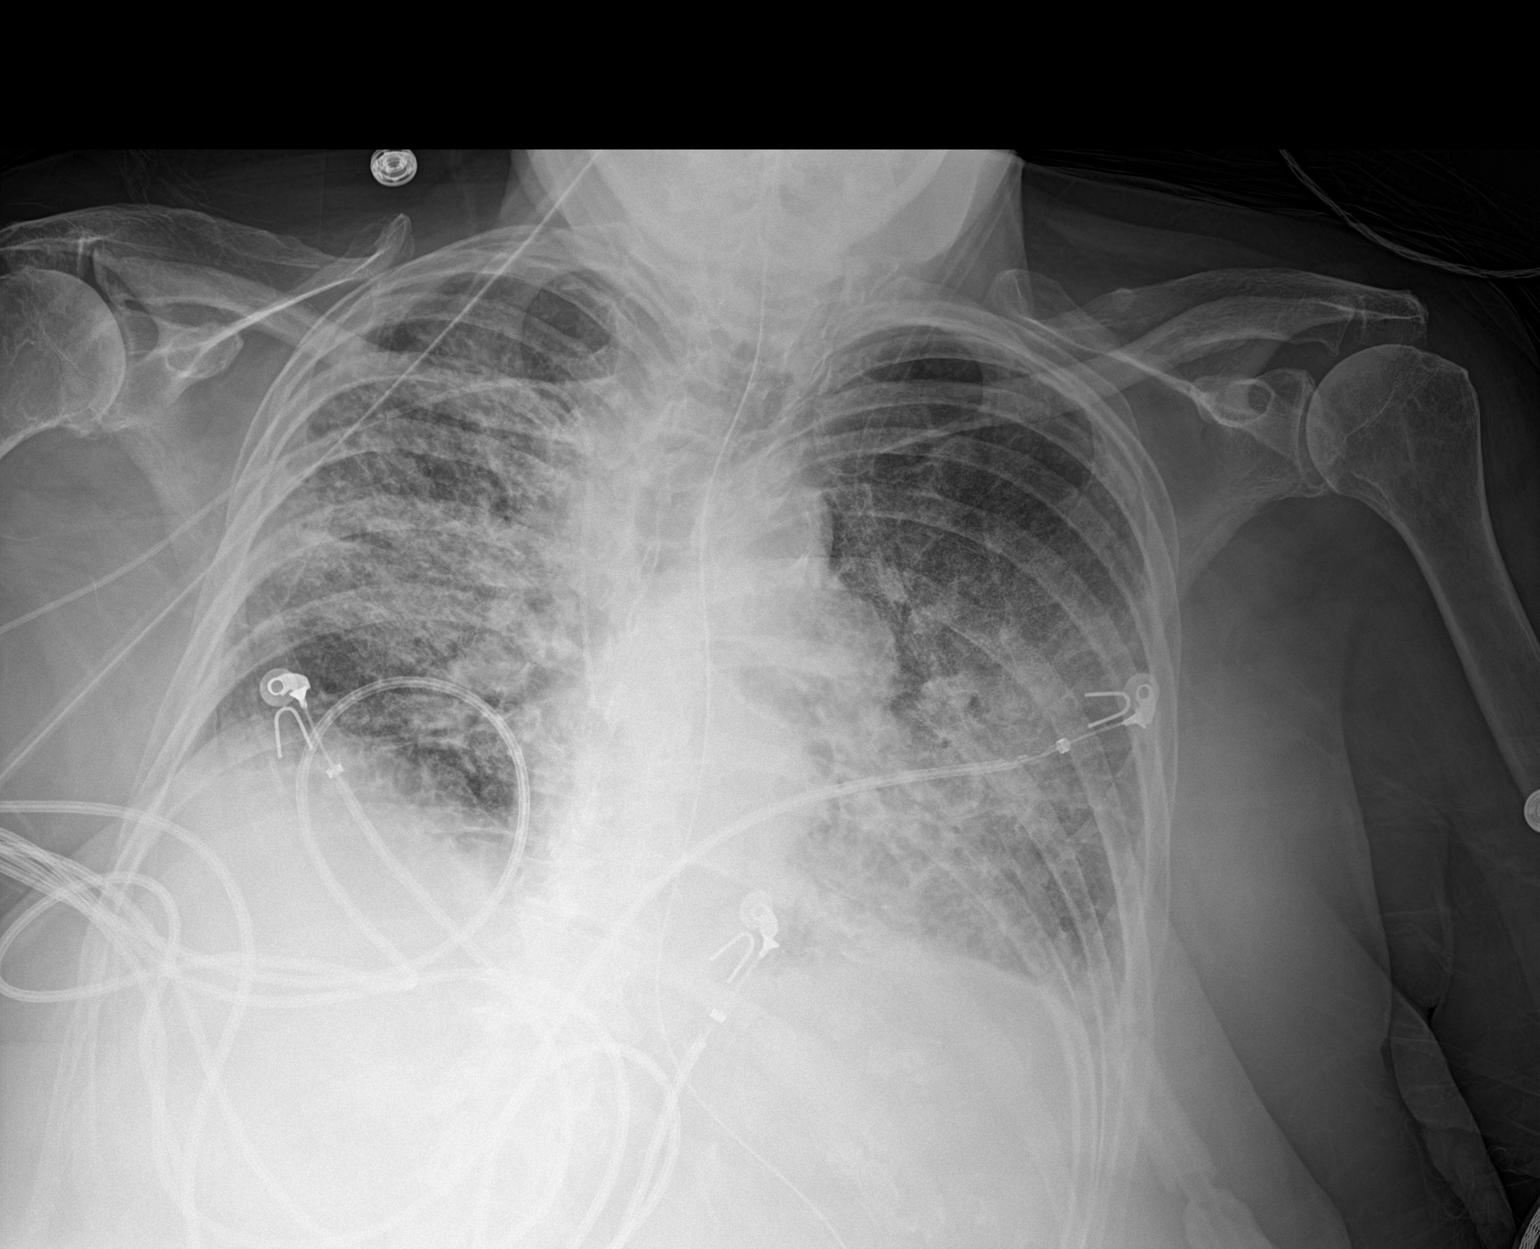

[1 of 1 positions shown; findings below may reference images not displayed]

FINDINGS: Cardiac shadow is stable. Right-sided PICC line is again seen in
satisfactory position. Nasogastric catheter is now seen within the
stomach. Diffuse bilateral patchy infiltrative changes are noted.
There is been an increase in left mid lung infiltrate when compared
with the prior study. No bony abnormality is noted.
IMPRESSION: Stable bilateral opacities with increase in the left mid lung
infiltrate.

## 2019-02-16 IMAGING — DX DG CHEST 1V PORT
1 series · 1 of 1 positions shown · non-contrast
Comparison: Radiograph October 21, 2017.

CLINICAL DATA: Oxygen desaturation.

EXAM:
PORTABLE CHEST 1 VIEW

[chest ap]
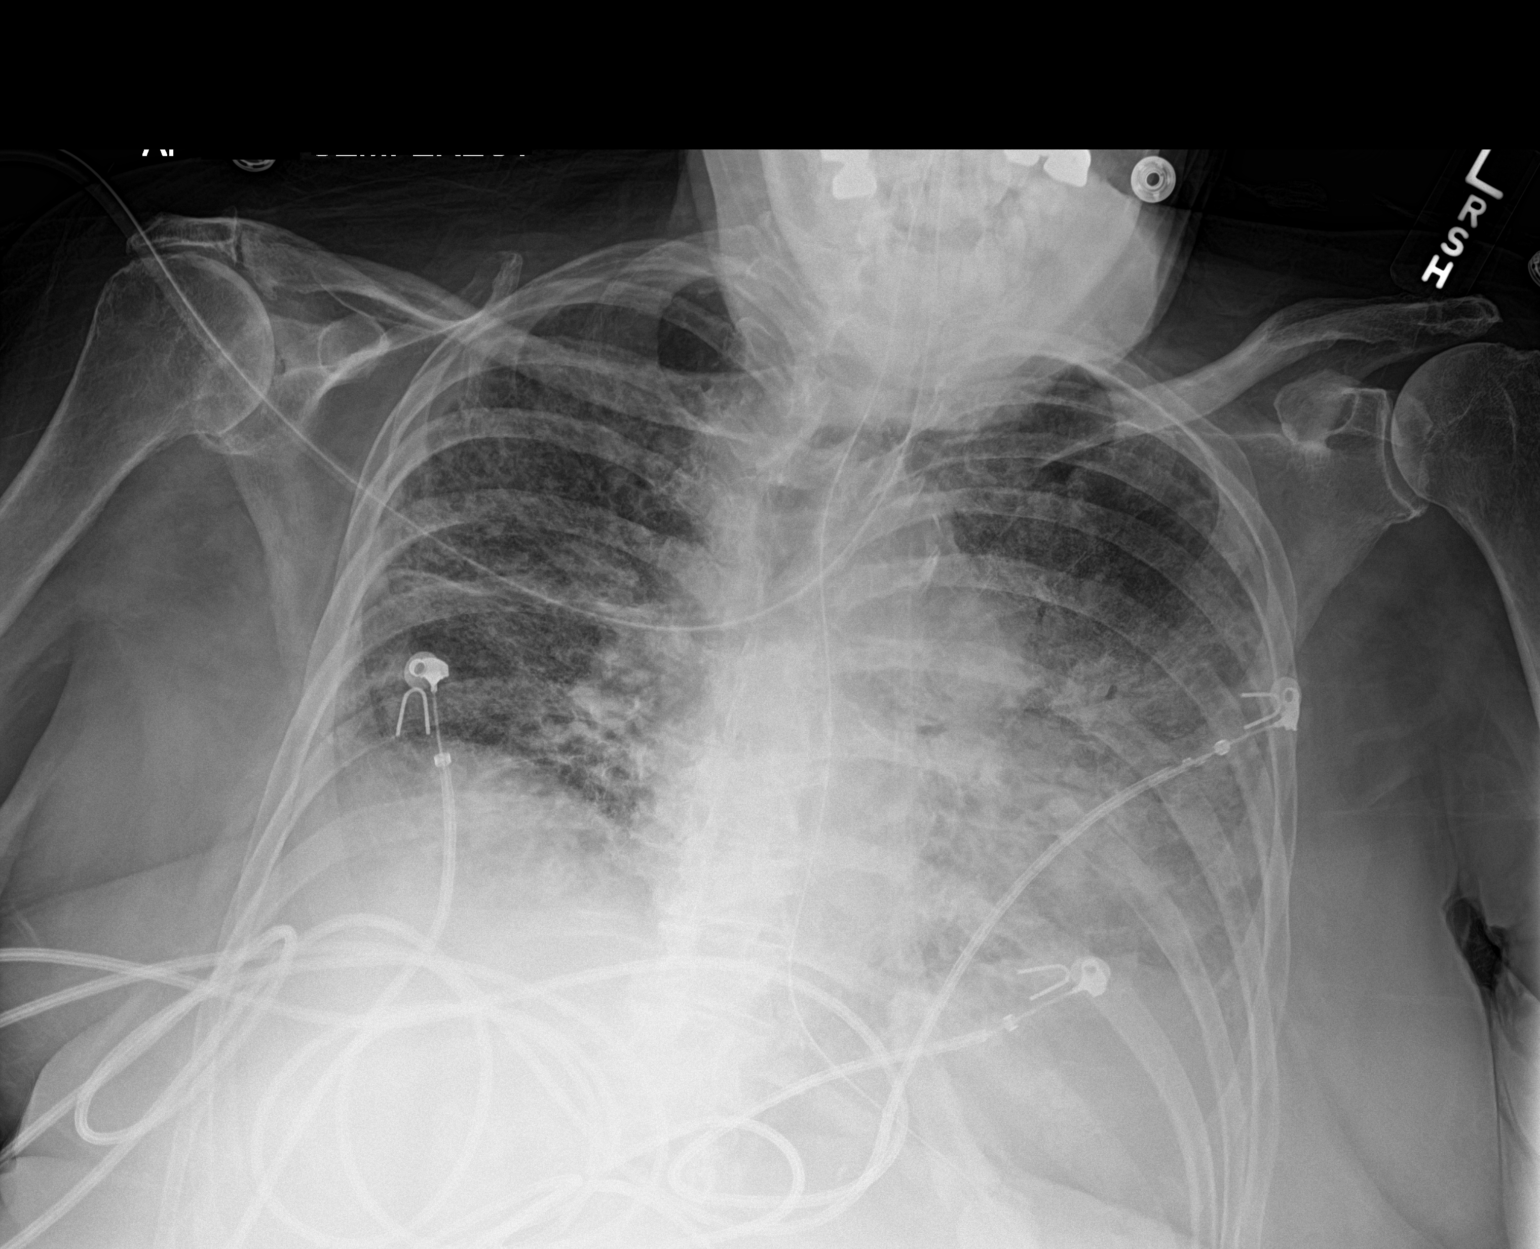

[1 of 1 positions shown; findings below may reference images not displayed]

FINDINGS: Stable cardiomediastinal silhouette. Nasogastric tube is seen
entering stomach. No pneumothorax is noted. Stable bilateral
airspace and interstitial densities are noted throughout both lungs
concerning for pneumonia or possibly edema. Minimal left pleural
effusion may be present. Bony thorax is unremarkable.
IMPRESSION: Stable bilateral lung opacities are noted concerning for pneumonia
or possibly edema.

## 2019-02-17 IMAGING — CR DG CHEST 1V PORT
1 series · 1 of 1 positions shown · non-contrast
Comparison: 10/26/2017

CLINICAL DATA: Intubation.

EXAM:
PORTABLE CHEST 1 VIEW

[AP]
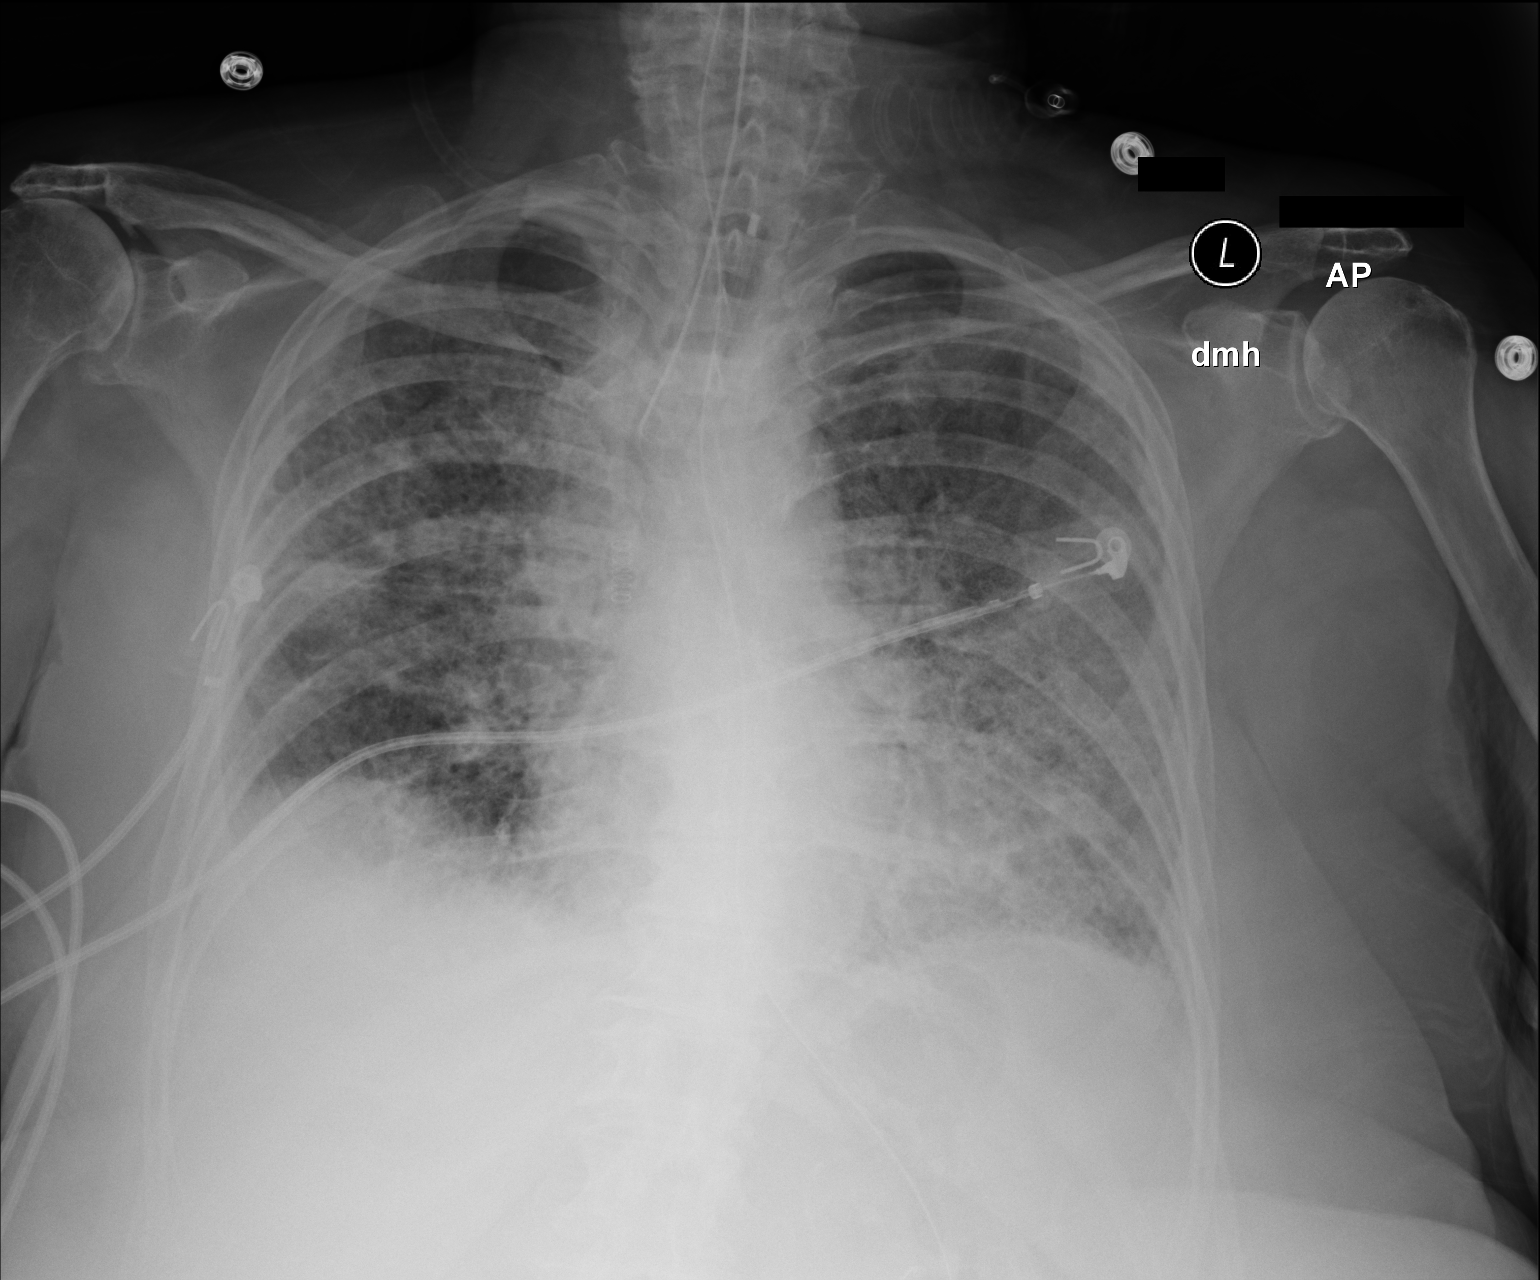

[1 of 1 positions shown; findings below may reference images not displayed]

FINDINGS: An endotracheal tube is identified with tip 2.5 cm above the carina.

An NG tube is again identified entering the stomach with tip off the
field of view.

Bilateral interstitial and airspace opacities are unchanged.

There is no evidence of pneumothorax.

Lung volumes are similar.
IMPRESSION: Endotracheal tube with tip 2.5 cm above the carina. Otherwise
unchanged appearance of the chest.
